# Patient Record
Sex: Male | Born: 1955 | Race: White | Hispanic: No | Marital: Single | Smoking: Never smoker
Health system: Southern US, Community
[De-identification: ages and names within clinical notes are randomized; demographics above are authoritative.]

## PROBLEM LIST (undated history)

## (undated) DIAGNOSIS — R011 Cardiac murmur, unspecified: Secondary | ICD-10-CM

## (undated) DIAGNOSIS — H269 Unspecified cataract: Secondary | ICD-10-CM

## (undated) DIAGNOSIS — H3320 Serous retinal detachment, unspecified eye: Secondary | ICD-10-CM

## (undated) DIAGNOSIS — E739 Lactose intolerance, unspecified: Secondary | ICD-10-CM

## (undated) DIAGNOSIS — F419 Anxiety disorder, unspecified: Secondary | ICD-10-CM

## (undated) DIAGNOSIS — I4891 Unspecified atrial fibrillation: Secondary | ICD-10-CM

## (undated) DIAGNOSIS — N529 Male erectile dysfunction, unspecified: Secondary | ICD-10-CM

## (undated) HISTORY — PX: EYE SURGERY: SHX253

## (undated) HISTORY — DX: Anxiety disorder, unspecified: F41.9

## (undated) HISTORY — PX: COLONOSCOPY: SHX174

## (undated) HISTORY — PX: APPENDECTOMY: SHX54

## (undated) HISTORY — DX: Lactose intolerance, unspecified: E73.9

## (undated) HISTORY — DX: Unspecified cataract: H26.9

## (undated) HISTORY — PX: UPPER GASTROINTESTINAL ENDOSCOPY: SHX188

## (undated) HISTORY — DX: Serous retinal detachment, unspecified eye: H33.20

## (undated) HISTORY — DX: Cardiac murmur, unspecified: R01.1

## (undated) HISTORY — DX: Male erectile dysfunction, unspecified: N52.9

---

## 2015-08-29 ENCOUNTER — Encounter (HOSPITAL_COMMUNITY): Admission: EM | Disposition: A | Discharge status: Home / Self Care | Payer: Self-pay | Source: Home / Self Care

## 2015-08-29 ENCOUNTER — Ambulatory Visit (INDEPENDENT_AMBULATORY_CARE_PROVIDER_SITE_OTHER): Payer: BLUE CROSS/BLUE SHIELD | Admitting: Family Medicine

## 2015-08-29 ENCOUNTER — Observation Stay (HOSPITAL_COMMUNITY): Payer: BLUE CROSS/BLUE SHIELD | Admitting: Anesthesiology

## 2015-08-29 ENCOUNTER — Emergency Department (HOSPITAL_COMMUNITY): Payer: BLUE CROSS/BLUE SHIELD

## 2015-08-29 ENCOUNTER — Ambulatory Visit: Admit: 2015-08-29 | Payer: Self-pay | Admitting: General Surgery

## 2015-08-29 ENCOUNTER — Encounter (HOSPITAL_COMMUNITY): Payer: Self-pay | Admitting: Emergency Medicine

## 2015-08-29 ENCOUNTER — Inpatient Hospital Stay (HOSPITAL_COMMUNITY)
Admission: EM | Admit: 2015-08-29 | Discharge: 2015-09-01 | DRG: 342 | Disposition: A | Discharge status: Home / Self Care | Payer: BLUE CROSS/BLUE SHIELD | Attending: Surgery | Admitting: Surgery

## 2015-08-29 VITALS — BP 104/56 | HR 63 | Temp 98.0°F | Resp 16 | Ht 79.5 in | Wt 194.0 lb

## 2015-08-29 DIAGNOSIS — K35891 Other acute appendicitis without perforation, with gangrene: Secondary | ICD-10-CM | POA: Diagnosis present

## 2015-08-29 DIAGNOSIS — Z7982 Long term (current) use of aspirin: Secondary | ICD-10-CM

## 2015-08-29 DIAGNOSIS — R100 Acute abdomen: Secondary | ICD-10-CM | POA: Diagnosis not present

## 2015-08-29 DIAGNOSIS — K3580 Unspecified acute appendicitis: Secondary | ICD-10-CM | POA: Diagnosis present

## 2015-08-29 DIAGNOSIS — Z800 Family history of malignant neoplasm of digestive organs: Secondary | ICD-10-CM | POA: Diagnosis not present

## 2015-08-29 DIAGNOSIS — K567 Ileus, unspecified: Secondary | ICD-10-CM | POA: Diagnosis not present

## 2015-08-29 DIAGNOSIS — F419 Anxiety disorder, unspecified: Secondary | ICD-10-CM | POA: Diagnosis present

## 2015-08-29 DIAGNOSIS — R109 Unspecified abdominal pain: Secondary | ICD-10-CM | POA: Diagnosis not present

## 2015-08-29 DIAGNOSIS — Z808 Family history of malignant neoplasm of other organs or systems: Secondary | ICD-10-CM | POA: Diagnosis not present

## 2015-08-29 DIAGNOSIS — K353 Acute appendicitis with localized peritonitis, without perforation or gangrene: Secondary | ICD-10-CM

## 2015-08-29 DIAGNOSIS — I482 Chronic atrial fibrillation: Secondary | ICD-10-CM | POA: Diagnosis present

## 2015-08-29 HISTORY — DX: Unspecified atrial fibrillation: I48.91

## 2015-08-29 HISTORY — PX: LAPAROSCOPIC APPENDECTOMY: SHX408

## 2015-08-29 LAB — COMPREHENSIVE METABOLIC PANEL
ALBUMIN: 3.9 g/dL (ref 3.5–5.0)
ALT: 23 U/L (ref 17–63)
ANION GAP: 9 (ref 5–15)
AST: 22 U/L (ref 15–41)
Alkaline Phosphatase: 73 U/L (ref 38–126)
BILIRUBIN TOTAL: 1.6 mg/dL — AB (ref 0.3–1.2)
BUN: 24 mg/dL — ABNORMAL HIGH (ref 6–20)
CO2: 24 mmol/L (ref 22–32)
Calcium: 8.8 mg/dL — ABNORMAL LOW (ref 8.9–10.3)
Chloride: 105 mmol/L (ref 101–111)
Creatinine, Ser: 1.06 mg/dL (ref 0.61–1.24)
GFR calc Af Amer: >60 mL/min (ref >60)
GFR calc non Af Amer: >60 mL/min (ref >60)
GLUCOSE: 118 mg/dL — AB (ref 65–99)
POTASSIUM: 4 mmol/L (ref 3.5–5.1)
SODIUM: 138 mmol/L (ref 135–145)
TOTAL PROTEIN: 7.1 g/dL (ref 6.5–8.1)

## 2015-08-29 LAB — CREATININE, SERUM
Creatinine, Ser: 0.86 mg/dL (ref 0.61–1.24)
GFR calc non Af Amer: >60 mL/min (ref >60)

## 2015-08-29 LAB — URINALYSIS, ROUTINE W REFLEX MICROSCOPIC
GLUCOSE, UA: NEGATIVE mg/dL
Hgb urine dipstick: NEGATIVE
KETONES UR: 40 mg/dL — AB
LEUKOCYTES UA: NEGATIVE
Nitrite: NEGATIVE
PROTEIN: 30 mg/dL — AB
Specific Gravity, Urine: 1.029 (ref 1.005–1.030)
pH: 5.5 (ref 5.0–8.0)

## 2015-08-29 LAB — CBC
HEMATOCRIT: 38.1 % — AB (ref 39.0–52.0)
Hemoglobin: 13 g/dL (ref 13.0–17.0)
MCH: 30.7 pg (ref 26.0–34.0)
MCHC: 34.1 g/dL (ref 30.0–36.0)
MCV: 90.1 fL (ref 78.0–100.0)
Platelets: 205 10*3/uL (ref 150–400)
RBC: 4.23 MIL/uL (ref 4.22–5.81)
RDW: 13.7 % (ref 11.5–15.5)
WBC: 11.1 10*3/uL — AB (ref 4.0–10.5)

## 2015-08-29 LAB — POC MICROSCOPIC URINALYSIS (UMFC)

## 2015-08-29 LAB — URINE MICROSCOPIC-ADD ON

## 2015-08-29 LAB — POCT CBC
Granulocyte percent: 85.6 %G — AB (ref 37–80)
HCT, POC: 41.5 % — AB (ref 43.5–53.7)
Hemoglobin: 14.8 g/dL (ref 14.1–18.1)
Lymph, poc: 1.3 (ref 0.6–3.4)
MCH, POC: 31.6 pg — AB (ref 27–31.2)
MCHC: 35.7 g/dL — AB (ref 31.8–35.4)
MCV: 88.5 fL (ref 80–97)
MID (cbc): 0.8 (ref 0–0.9)
MPV: 8.2 fL (ref 0–99.8)
POC Granulocyte: 12.6 — AB (ref 2–6.9)
POC LYMPH PERCENT: 9 %L — AB (ref 10–50)
POC MID %: 5.4 %M (ref 0–12)
Platelet Count, POC: 192 10*3/uL (ref 142–424)
RBC: 4.69 M/uL (ref 4.69–6.13)
RDW, POC: 13.7 %
WBC: 14.7 10*3/uL — AB (ref 4.6–10.2)

## 2015-08-29 LAB — POCT URINALYSIS DIP (MANUAL ENTRY)
Glucose, UA: NEGATIVE
Leukocytes, UA: NEGATIVE
Nitrite, UA: NEGATIVE
Protein Ur, POC: 100 — AB
Spec Grav, UA: 1.025
Urobilinogen, UA: 0.2
pH, UA: 5.5

## 2015-08-29 LAB — LIPASE, BLOOD: LIPASE: 26 U/L (ref 11–51)

## 2015-08-29 LAB — I-STAT CG4 LACTIC ACID, ED: Lactic Acid, Venous: 0.71 mmol/L (ref 0.5–2.0)

## 2015-08-29 LAB — IFOBT (OCCULT BLOOD): IFOBT: POSITIVE

## 2015-08-29 LAB — TYPE AND SCREEN
ABO/RH(D): A NEG
Antibody Screen: NEGATIVE

## 2015-08-29 LAB — ABO/RH: ABO/RH(D): A NEG

## 2015-08-29 SURGERY — APPENDECTOMY, LAPAROSCOPIC
Anesthesia: General

## 2015-08-29 MED ORDER — ESMOLOL HCL 100 MG/10ML IV SOLN
INTRAVENOUS | Status: DC | PRN
  Administered 2015-08-29: 30 ug via INTRAVENOUS
  Administered 2015-08-29: 15 ug via INTRAVENOUS

## 2015-08-29 MED ORDER — ONDANSETRON HCL 4 MG/2ML IJ SOLN: 4.0000 mg | Freq: Four times a day (QID) | INTRAMUSCULAR | Status: DC | PRN

## 2015-08-29 MED ORDER — HYDROMORPHONE HCL 1 MG/ML IJ SOLN
1.0000 mg | Freq: Once | INTRAMUSCULAR | Status: DC
  Filled 2015-08-29: qty 1

## 2015-08-29 MED ORDER — PIPERACILLIN-TAZOBACTAM 3.375 G IVPB 30 MIN
3.3750 g | Freq: Once | INTRAVENOUS | Status: AC
Start: 2015-08-29 — End: 2015-08-29
  Administered 2015-08-29: 3.375 g via INTRAVENOUS
  Filled 2015-08-29: qty 50

## 2015-08-29 MED ORDER — ENOXAPARIN SODIUM 40 MG/0.4ML ~~LOC~~ SOLN
40.0000 mg | SUBCUTANEOUS | Status: DC
  Filled 2015-08-29: qty 0.4

## 2015-08-29 MED ORDER — ONDANSETRON 4 MG PO TBDP
4.0000 mg | ORAL_TABLET | Freq: Four times a day (QID) | ORAL | Status: DC | PRN
  Filled 2015-08-29: qty 1

## 2015-08-29 MED ORDER — ROCURONIUM BROMIDE 100 MG/10ML IV SOLN
INTRAVENOUS | Status: DC | PRN
  Administered 2015-08-29: 40 mg via INTRAVENOUS

## 2015-08-29 MED ORDER — FENTANYL CITRATE (PF) 250 MCG/5ML IJ SOLN
INTRAMUSCULAR | Status: AC
  Filled 2015-08-29: qty 5

## 2015-08-29 MED ORDER — ACETAMINOPHEN 325 MG PO TABS: 650.0000 mg | ORAL_TABLET | Freq: Four times a day (QID) | ORAL | Status: DC | PRN

## 2015-08-29 MED ORDER — HYDROCODONE-ACETAMINOPHEN 5-325 MG PO TABS
1.0000 | ORAL_TABLET | ORAL | Status: DC | PRN
  Administered 2015-08-29 – 2015-08-30 (×2): 1 via ORAL
  Administered 2015-08-30: 2 via ORAL
  Administered 2015-08-31 – 2015-09-01 (×3): 1 via ORAL
  Filled 2015-08-29: qty 2
  Filled 2015-08-29 (×5): qty 1

## 2015-08-29 MED ORDER — SUGAMMADEX SODIUM 200 MG/2ML IV SOLN
INTRAVENOUS | Status: AC
Start: 2015-08-29 — End: 2015-08-29
  Filled 2015-08-29: qty 2

## 2015-08-29 MED ORDER — MIDAZOLAM HCL 5 MG/5ML IJ SOLN
INTRAMUSCULAR | Status: DC | PRN
  Administered 2015-08-29: 2 mg via INTRAVENOUS

## 2015-08-29 MED ORDER — ROCURONIUM BROMIDE 100 MG/10ML IV SOLN
INTRAVENOUS | Status: AC
  Filled 2015-08-29: qty 1

## 2015-08-29 MED ORDER — HEPARIN SODIUM (PORCINE) 5000 UNIT/ML IJ SOLN
5000.0000 [IU] | Freq: Three times a day (TID) | INTRAMUSCULAR | Status: DC
  Administered 2015-08-29 – 2015-09-01 (×7): 5000 [IU] via SUBCUTANEOUS
  Filled 2015-08-29 (×7): qty 1

## 2015-08-29 MED ORDER — ONDANSETRON HCL 4 MG/2ML IJ SOLN
INTRAMUSCULAR | Status: AC
  Filled 2015-08-29: qty 2

## 2015-08-29 MED ORDER — ASPIRIN 325 MG PO TABS: 325.0000 mg | ORAL_TABLET | Freq: Every day | ORAL | Status: DC

## 2015-08-29 MED ORDER — MEPERIDINE HCL 50 MG/ML IJ SOLN: 6.2500 mg | INTRAMUSCULAR | Status: DC | PRN

## 2015-08-29 MED ORDER — HYDROMORPHONE HCL 1 MG/ML IJ SOLN
0.2500 mg | INTRAMUSCULAR | Status: DC | PRN
  Administered 2015-08-29: 0.5 mg via INTRAVENOUS

## 2015-08-29 MED ORDER — LIDOCAINE HCL (CARDIAC) 20 MG/ML IV SOLN
INTRAVENOUS | Status: DC | PRN
  Administered 2015-08-29: 50 mg via INTRAVENOUS

## 2015-08-29 MED ORDER — MORPHINE SULFATE (PF) 2 MG/ML IV SOLN: 2.0000 mg | INTRAVENOUS | Status: DC | PRN

## 2015-08-29 MED ORDER — PIPERACILLIN-TAZOBACTAM 4.5 G IVPB: 4.5000 g | Freq: Once | INTRAVENOUS | Status: DC

## 2015-08-29 MED ORDER — DILTIAZEM HCL 100 MG IV SOLR
5.0000 mg/h | INTRAVENOUS | Status: DC
  Administered 2015-08-29: 5 mg/h via INTRAVENOUS
  Filled 2015-08-29: qty 100

## 2015-08-29 MED ORDER — PIPERACILLIN-TAZOBACTAM 3.375 G IVPB
3.3750 g | Freq: Three times a day (TID) | INTRAVENOUS | Status: DC
  Filled 2015-08-29: qty 50

## 2015-08-29 MED ORDER — PIPERACILLIN-TAZOBACTAM 3.375 G IVPB
INTRAVENOUS | Status: AC
  Filled 2015-08-29: qty 50

## 2015-08-29 MED ORDER — FENTANYL CITRATE (PF) 100 MCG/2ML IJ SOLN
50.0000 ug | INTRAMUSCULAR | Status: DC | PRN
  Filled 2015-08-29: qty 2

## 2015-08-29 MED ORDER — SUGAMMADEX SODIUM 200 MG/2ML IV SOLN
INTRAVENOUS | Status: DC | PRN
  Administered 2015-08-29: 175 mg via INTRAVENOUS

## 2015-08-29 MED ORDER — IOPAMIDOL (ISOVUE-370) INJECTION 76%
100.0000 mL | Freq: Once | INTRAVENOUS | Status: AC | PRN
  Administered 2015-08-29: 100 mL via INTRAVENOUS

## 2015-08-29 MED ORDER — DILTIAZEM LOAD VIA INFUSION
10.0000 mg | Freq: Once | INTRAVENOUS | Status: DC
  Filled 2015-08-29: qty 10

## 2015-08-29 MED ORDER — PROPOFOL 10 MG/ML IV BOLUS
INTRAVENOUS | Status: AC
  Filled 2015-08-29: qty 20

## 2015-08-29 MED ORDER — SUCCINYLCHOLINE CHLORIDE 20 MG/ML IJ SOLN
INTRAMUSCULAR | Status: DC | PRN
  Administered 2015-08-29: 100 mg via INTRAVENOUS

## 2015-08-29 MED ORDER — SODIUM CHLORIDE 0.9 % IV BOLUS (SEPSIS)
1000.0000 mL | Freq: Once | INTRAVENOUS | Status: AC
  Administered 2015-08-29: 1000 mL via INTRAVENOUS

## 2015-08-29 MED ORDER — ESMOLOL HCL 100 MG/10ML IV SOLN
INTRAVENOUS | Status: AC
  Filled 2015-08-29: qty 10

## 2015-08-29 MED ORDER — PIPERACILLIN-TAZOBACTAM 3.375 G IVPB
3.3750 g | Freq: Three times a day (TID) | INTRAVENOUS | Status: DC
  Administered 2015-08-29: 3.375 g via INTRAVENOUS
  Filled 2015-08-29 (×2): qty 50

## 2015-08-29 MED ORDER — PHENYLEPHRINE HCL 10 MG/ML IJ SOLN
INTRAMUSCULAR | Status: DC | PRN
  Administered 2015-08-29 (×3): 120 ug via INTRAVENOUS

## 2015-08-29 MED ORDER — BUPIVACAINE LIPOSOME 1.3 % IJ SUSP
20.0000 mL | Freq: Once | INTRAMUSCULAR | Status: AC
  Administered 2015-08-29: 20 mL
  Filled 2015-08-29: qty 20

## 2015-08-29 MED ORDER — MORPHINE SULFATE (PF) 2 MG/ML IV SOLN
1.0000 mg | INTRAVENOUS | Status: DC | PRN
  Administered 2015-08-30 (×2): 1 mg via INTRAVENOUS
  Filled 2015-08-29 (×2): qty 1

## 2015-08-29 MED ORDER — BUPROPION HCL ER (XL) 150 MG PO TB24: 150.0000 mg | ORAL_TABLET | Freq: Every day | ORAL | Status: DC

## 2015-08-29 MED ORDER — DILTIAZEM HCL 100 MG IV SOLR: 5.0000 mg/h | INTRAVENOUS | Status: DC

## 2015-08-29 MED ORDER — HYDROMORPHONE HCL 1 MG/ML IJ SOLN
INTRAMUSCULAR | Status: AC
  Administered 2015-08-29: 0.5 mg
  Filled 2015-08-29: qty 1

## 2015-08-29 MED ORDER — ACETAMINOPHEN 650 MG RE SUPP: 650.0000 mg | Freq: Four times a day (QID) | RECTAL | Status: DC | PRN

## 2015-08-29 MED ORDER — KCL IN DEXTROSE-NACL 20-5-0.45 MEQ/L-%-% IV SOLN
INTRAVENOUS | Status: DC
  Administered 2015-08-29 – 2015-08-31 (×5): via INTRAVENOUS
  Filled 2015-08-29 (×7): qty 1000

## 2015-08-29 MED ORDER — MIDAZOLAM HCL 2 MG/2ML IJ SOLN
INTRAMUSCULAR | Status: AC
  Filled 2015-08-29: qty 2

## 2015-08-29 MED ORDER — FENTANYL CITRATE (PF) 100 MCG/2ML IJ SOLN
INTRAMUSCULAR | Status: DC | PRN
  Administered 2015-08-29 (×3): 50 ug via INTRAVENOUS
  Administered 2015-08-29: 100 ug via INTRAVENOUS

## 2015-08-29 MED ORDER — LACTATED RINGERS IV SOLN
INTRAVENOUS | Status: DC | PRN
  Administered 2015-08-29: 18:00:00 via INTRAVENOUS

## 2015-08-29 MED ORDER — LIDOCAINE HCL (CARDIAC) 20 MG/ML IV SOLN
INTRAVENOUS | Status: AC
  Filled 2015-08-29: qty 5

## 2015-08-29 SURGICAL SUPPLY — 42 items
APPLIER CLIP 5 13 M/L LIGAMAX5 (MISCELLANEOUS)
APPLIER CLIP ROT 10 11.4 M/L (STAPLE)
BENZOIN TINCTURE PRP APPL 2/3 (GAUZE/BANDAGES/DRESSINGS) IMPLANT
CHLORAPREP W/TINT 26ML (MISCELLANEOUS) IMPLANT
CLIP APPLIE 5 13 M/L LIGAMAX5 (MISCELLANEOUS) IMPLANT
CLIP APPLIE ROT 10 11.4 M/L (STAPLE) IMPLANT
COVER SURGICAL LIGHT HANDLE (MISCELLANEOUS) IMPLANT
CUTTER FLEX LINEAR 45M (STAPLE) IMPLANT
DECANTER SPIKE VIAL GLASS SM (MISCELLANEOUS) IMPLANT
DRAIN CHANNEL 19F RND (DRAIN) IMPLANT
DRAPE LAPAROSCOPIC ABDOMINAL (DRAPES) ×2 IMPLANT
DRSG TEGADERM 2-3/8X2-3/4 SM (GAUZE/BANDAGES/DRESSINGS) ×4 IMPLANT
ELECT REM PT RETURN 9FT ADLT (ELECTROSURGICAL) ×2
ELECTRODE REM PT RTRN 9FT ADLT (ELECTROSURGICAL) ×1 IMPLANT
ENDOLOOP SUT PDS II  0 18 (SUTURE)
ENDOLOOP SUT PDS II 0 18 (SUTURE) IMPLANT
EVACUATOR SILICONE 100CC (DRAIN) IMPLANT
GAUZE SPONGE 2X2 8PLY STRL LF (GAUZE/BANDAGES/DRESSINGS) ×1 IMPLANT
GLOVE ECLIPSE 8.0 STRL XLNG CF (GLOVE) ×12 IMPLANT
GLOVE INDICATOR 8.0 STRL GRN (GLOVE) ×2 IMPLANT
GOWN STRL REUS W/TWL XL LVL3 (GOWN DISPOSABLE) ×6 IMPLANT
KIT BASIN OR (CUSTOM PROCEDURE TRAY) ×2 IMPLANT
POUCH SPECIMEN RETRIEVAL 10MM (ENDOMECHANICALS) ×2 IMPLANT
RELOAD 45 VASCULAR/THIN (ENDOMECHANICALS) ×2 IMPLANT
RELOAD STAPLE TA45 3.5 REG BLU (ENDOMECHANICALS) IMPLANT
SCISSORS LAP 5X35 DISP (ENDOMECHANICALS) IMPLANT
SET IRRIG TUBING LAPAROSCOPIC (IRRIGATION / IRRIGATOR) ×2 IMPLANT
SHEARS HARMONIC ACE PLUS 36CM (ENDOMECHANICALS) ×2 IMPLANT
SLEEVE XCEL OPT CAN 5 100 (ENDOMECHANICALS) ×2 IMPLANT
SOLUTION ANTI FOG 6CC (MISCELLANEOUS) ×2 IMPLANT
SPONGE GAUZE 2X2 STER 10/PKG (GAUZE/BANDAGES/DRESSINGS) ×1
STRIP CLOSURE SKIN 1/2X4 (GAUZE/BANDAGES/DRESSINGS) ×2 IMPLANT
SUT ETHILON 3 0 PS 1 (SUTURE) IMPLANT
SUT MNCRL AB 4-0 PS2 18 (SUTURE) ×2 IMPLANT
TOWEL OR 17X26 10 PK STRL BLUE (TOWEL DISPOSABLE) ×2 IMPLANT
TOWEL OR NON WOVEN STRL DISP B (DISPOSABLE) ×2 IMPLANT
TRAY FOLEY W/METER SILVER 14FR (SET/KITS/TRAYS/PACK) IMPLANT
TRAY FOLEY W/METER SILVER 16FR (SET/KITS/TRAYS/PACK) ×2 IMPLANT
TRAY LAPAROSCOPIC (CUSTOM PROCEDURE TRAY) ×2 IMPLANT
TROCAR BLADELESS OPT 5 100 (ENDOMECHANICALS) ×2 IMPLANT
TROCAR XCEL BLUNT TIP 100MML (ENDOMECHANICALS) ×2 IMPLANT
TUBING INSUF HEATED (TUBING) ×2 IMPLANT

## 2015-08-29 NOTE — Transfer of Care (Signed)
Immediate Anesthesia Transfer of Care Note  Patient: Jose Holden  Procedure(s) Performed: Procedure(s): APPENDECTOMY LAPAROSCOPIC (N/A)  Patient Location: PACU  Anesthesia Type:General  Level of Consciousness:  sedated, patient cooperative and responds to stimulation  Airway & Oxygen Therapy:Patient Spontanous Breathing and Patient connected to face mask oxgen  Post-op Assessment:  Report given to PACU RN and Post -op Vital signs reviewed and stable  Post vital signs:  Reviewed and stable  Last Vitals:  Filed Vitals:   08/29/15 1615 08/29/15 2018  BP: 128/53 133/87  Pulse: 67 52  Temp: 37.5 C   Resp: 16     Complications: No apparent anesthesia complications

## 2015-08-29 NOTE — ED Provider Notes (Signed)
Medical screening examination/treatment/procedure(s) were conducted as a shared visit with non-physician practitioner(s) and myself.  I personally evaluated the patient during the encounter.   EKG Interpretation None     60 y.o. male presents with right lower and suprapubic abdominal pain over the last 3 days. Appears to be worsening. Chronic a fib on daily aspirin. Guarding and rebound on exam, leukocytosis noted at urgent care. CTA to eval for appy vs mesenteric ischemia vs perforated viscous or other surgical cause.    CT c/w appendicitis. Surgery consulted and will admit the patient for further management.   Results for orders placed or performed during the hospital encounter of 08/29/15  Comprehensive metabolic panel  Result Value Ref Range   Sodium 138 135 - 145 mmol/L   Potassium 4.0 3.5 - 5.1 mmol/L   Chloride 105 101 - 111 mmol/L   CO2 24 22 - 32 mmol/L   Glucose, Bld 118 (H) 65 - 99 mg/dL   BUN 24 (H) 6 - 20 mg/dL   Creatinine, Ser 1.06 0.61 - 1.24 mg/dL   Calcium 8.8 (L) 8.9 - 10.3 mg/dL   Total Protein 7.1 6.5 - 8.1 g/dL   Albumin 3.9 3.5 - 5.0 g/dL   AST 22 15 - 41 U/L   ALT 23 17 - 63 U/L   Alkaline Phosphatase 73 38 - 126 U/L   Total Bilirubin 1.6 (H) 0.3 - 1.2 mg/dL   GFR calc non Af Amer >60 >60 mL/min   GFR calc Af Amer >60 >60 mL/min   Anion gap 9 5 - 15  Lipase, blood  Result Value Ref Range   Lipase 26 11 - 51 U/L  Urinalysis, Routine w reflex microscopic  Result Value Ref Range   Color, Urine AMBER (A) YELLOW   APPearance CLEAR CLEAR   Specific Gravity, Urine 1.029 1.005 - 1.030   pH 5.5 5.0 - 8.0   Glucose, UA NEGATIVE NEGATIVE mg/dL   Hgb urine dipstick NEGATIVE NEGATIVE   Bilirubin Urine SMALL (A) NEGATIVE   Ketones, ur 40 (A) NEGATIVE mg/dL   Protein, ur 30 (A) NEGATIVE mg/dL   Nitrite NEGATIVE NEGATIVE   Leukocytes, UA NEGATIVE NEGATIVE  Urine microscopic-add on  Result Value Ref Range   Squamous Epithelial / LPF 0-5 (A) NONE SEEN   WBC, UA  0-5 0 - 5 WBC/hpf   RBC / HPF 0-5 0 - 5 RBC/hpf   Bacteria, UA RARE (A) NONE SEEN   Urine-Other MUCOUS PRESENT   I-Stat CG4 Lactic Acid, ED  Result Value Ref Range   Lactic Acid, Venous 0.71 0.5 - 2.0 mmol/L  Type and screen [facility]  Result Value Ref Range   ABO/RH(D) A NEG    Antibody Screen NEG    Sample Expiration 09/01/2015   ABO/Rh  Result Value Ref Range   ABO/RH(D) A NEG    Ct Cta Abd/pel W/cm &/or W/o Cm  08/29/2015  CLINICAL DATA:  Evaluate for mesenteric ischemia. Fever on Friday. Lower abdominal and pelvic pain. EXAM: CTA ABDOMEN AND PELVIS wITHOUT AND WITH CONTRAST TECHNIQUE: Multidetector CT imaging of the abdomen and pelvis was performed using the standard protocol during bolus administration of intravenous contrast. Multiplanar reconstructed images and MIPs were obtained and reviewed to evaluate the vascular anatomy. CONTRAST:  100 cc Isovue 370 IV COMPARISON:  None. FINDINGS: Lower chest: 11 mm nodule at the left lung base posteriorly. Right lung bases clear. No effusions. Heart is mildly enlarged. Hepatobiliary: No focal hepatic abnormality. Gallbladder  unremarkable. Pancreas: No focal abnormality Spleen: No focal abnormality Adrenals/Urinary Tract: No adrenal abnormality. No focal renal abnormality. No stones or hydronephrosis. Urinary bladder is unremarkable. Stomach/Bowel: The appendix is dilated containing several high-density areas, likely appendicolith. Appendix dilated up to 17 mm. Surrounding inflammatory change in the pelvis. The appendix extends into the cul-de-sac with is inflammatory change. Adjacent small bowel loops also demonstrate wall thickening, likely secondarily infected as is the adjacent sigmoid colon. Stomach, large and small bowel otherwise grossly unremarkable. Vascular/Lymphatic: Scattered aortic calcifications. No aneurysm. Mild narrowing at the origin of the celiac artery. Superior mesenteric artery and inferior mesenteric artery are  widely patent. Main renal arteries are widely patent. There is a very small accessory left renal artery arising from the distal aorta. Reproductive: Grossly unremarkable Other: Trace free fluid in the pelvis.  No free air. Musculoskeletal: No acute bony abnormality or focal bone lesion. Review of the MIP images confirms the above findings. IMPRESSION: Inflammatory process in the pelvis which appears to be centered around the dilated appendix with several high density areas, likely appendicolith. Adjacent small bowel loops and sigmoid colon are also inflamed with wall thickening, likely secondarily. Findings most likely reflect acute appendicitis. Trace free fluid in the pelvis. Mild narrowing of the celiac origin. Remainder of the mesenteric vessels and renal arteries are widely patent. 11 mm pleural-based nodule at the left lung base posteriorly. This could be followed with chest CT in 3-6 months. Electronically Signed   By: Rolm Baptise M.D.   On: 08/29/2015 13:55      See related encounter note   Leo Grosser, MD 08/29/15 [phone]

## 2015-08-29 NOTE — ED Notes (Signed)
Pt refusing pain medication at this time

## 2015-08-29 NOTE — ED Provider Notes (Signed)
CSN: [id number]     Arrival date & time 08/29/15  1034 History   First MD Initiated Contact with Patient 08/29/15 1120     Chief Complaint  Patient presents with  . Abdominal Pain  . Melena    HPI Comments: 60 year old male presents with abdominal pain for the past 3 days. He states he was out of town Friday night, had a beer with a friend, and started having an uncomfortable feeling in his stomach. He ate a meal that night and when he went to lie down to sleep started to have abdominal pain. He reports subjective fever and chills on Saturday but took an ASA and felt like he has not had a fever since them. He has been taking Pepto-Bismol with minimal relief. Last dose was Sunday morning. Today he reports associated diarrhea and black stools. He went to UC where they did a CBC, UA, and FOBT which showed elevated WBC with positive blood in stool. Provider was concerned for possible perforated ulcer. Pt denies chest pain, SOB, nausea, or vomiting. He is currently taking 325mg  ASA for A.fib.   Patient is a 60 y.o. male presenting with abdominal pain.  Abdominal Pain Associated symptoms: chills, diarrhea and fever   Associated symptoms: no chest pain, no constipation, no dysuria, no nausea, no shortness of breath and no vomiting     Past Medical History  Diagnosis Date  . Anxiety   . Cataract   . Heart murmur    Past Surgical History  Procedure Laterality Date  . Eye surgery     Family History  Problem Relation Age of Onset  . Cancer Mother   . Cancer Father    Social History  Substance Use Topics  . Smoking status: Never Smoker   . Smokeless tobacco: None  . Alcohol Use: None    Review of Systems  Constitutional: Positive for fever and chills.  Respiratory: Negative for shortness of breath.   Cardiovascular: Negative for chest pain.  Gastrointestinal: Positive for abdominal pain, diarrhea and blood in stool. Negative for nausea, vomiting, constipation and abdominal distention.   Genitourinary: Negative for dysuria.  All other systems reviewed and are negative.   Allergies  Review of patient's allergies indicates no known allergies.  Home Medications   Prior to Admission medications   Medication Sig Start Date End Date Taking? Authorizing Provider  aspirin 325 MG tablet Take 325 mg by mouth daily.    Historical Provider, MD  buPROPion (WELLBUTRIN XL) 150 MG 24 hr tablet Take 150 mg by mouth daily.    Historical Provider, MD   BP 123/71 mmHg  Pulse 63  Temp(Src) 98.6 F (37 C) (Oral)  Resp 18  SpO2 99%   Physical Exam  Constitutional: He is oriented to person, place, and time. He appears well-developed and well-nourished. No distress.  HENT:  Head: Normocephalic and atraumatic.  Eyes: Conjunctivae are normal. Pupils are equal, round, and reactive to light. Right eye exhibits no discharge. Left eye exhibits no discharge. No scleral icterus.  Neck: Normal range of motion.  Cardiovascular: Normal rate.  An irregular rhythm present. Exam reveals no gallop and no friction rub.   No murmur heard. Pulmonary/Chest: Effort normal. No respiratory distress. He has no wheezes. He has no rales. He exhibits no tenderness.  Abdominal: Soft. He exhibits no distension and no mass. There is tenderness. There is rebound and guarding.  Significant tenderness over RLQ and suprapubic area. +Rebound tenderness.   Neurological: He is alert  and oriented to person, place, and time.  Skin: Skin is warm and dry.  Psychiatric: He has a normal mood and affect.    ED Course  Procedures (including critical care time) Labs Review Labs Reviewed  COMPREHENSIVE METABOLIC PANEL - Abnormal; Notable for the following:    Glucose, Bld 118 (*)    BUN 24 (*)    Calcium 8.8 (*)    Total Bilirubin 1.6 (*)    All other components within normal limits  URINALYSIS, ROUTINE W REFLEX MICROSCOPIC (NOT AT [facility]) - Abnormal; Notable for the following:    Color, Urine AMBER (*)    Bilirubin  Urine SMALL (*)    Ketones, ur 40 (*)    Protein, ur 30 (*)    All other components within normal limits  URINE MICROSCOPIC-ADD ON - Abnormal; Notable for the following:    Squamous Epithelial / LPF 0-5 (*)    Bacteria, UA RARE (*)    All other components within normal limits  LIPASE, BLOOD  I-STAT CG4 LACTIC ACID, ED  TYPE AND SCREEN  ABO/RH    Imaging Review Ct Cta Abd/pel W/cm &/or W/o Cm  08/29/2015  CLINICAL DATA:  Evaluate for mesenteric ischemia. Fever on Friday. Lower abdominal and pelvic pain. EXAM: CTA ABDOMEN AND PELVIS wITHOUT AND WITH CONTRAST TECHNIQUE: Multidetector CT imaging of the abdomen and pelvis was performed using the standard protocol during bolus administration of intravenous contrast. Multiplanar reconstructed images and MIPs were obtained and reviewed to evaluate the vascular anatomy. CONTRAST:  100 cc Isovue 370 IV COMPARISON:  None. FINDINGS: Lower chest: 11 mm nodule at the left lung base posteriorly. Right lung bases clear. No effusions. Heart is mildly enlarged. Hepatobiliary: No focal hepatic abnormality. Gallbladder unremarkable. Pancreas: No focal abnormality Spleen: No focal abnormality Adrenals/Urinary Tract: No adrenal abnormality. No focal renal abnormality. No stones or hydronephrosis. Urinary bladder is unremarkable. Stomach/Bowel: The appendix is dilated containing several high-density areas, likely appendicolith. Appendix dilated up to 17 mm. Surrounding inflammatory change in the pelvis. The appendix extends into the cul-de-sac with is inflammatory change. Adjacent small bowel loops also demonstrate wall thickening, likely secondarily infected as is the adjacent sigmoid colon. Stomach, large and small bowel otherwise grossly unremarkable. Vascular/Lymphatic: Scattered aortic calcifications. No aneurysm. Mild narrowing at the origin of the celiac artery. Superior mesenteric artery and inferior mesenteric artery are widely patent. Main renal arteries are  widely patent. There is a very small accessory left renal artery arising from the distal aorta. Reproductive: Grossly unremarkable Other: Trace free fluid in the pelvis.  No free air. Musculoskeletal: No acute bony abnormality or focal bone lesion. Review of the MIP images confirms the above findings. IMPRESSION: Inflammatory process in the pelvis which appears to be centered around the dilated appendix with several high density areas, likely appendicolith. Adjacent small bowel loops and sigmoid colon are also inflamed with wall thickening, likely secondarily. Findings most likely reflect acute appendicitis. Trace free fluid in the pelvis. Mild narrowing of the celiac origin. Remainder of the mesenteric vessels and renal arteries are widely patent. 11 mm pleural-based nodule at the left lung base posteriorly. This could be followed with chest CT in 3-6 months. Electronically Signed   By: Rolm Baptise M.D.   On: 08/29/2015 13:55   I have personally reviewed and evaluated these images and lab results as part of my medical decision-making.   EKG Interpretation None      MDM   Final diagnoses:  Acute appendicitis with localized peritonitis  60 year old male presents from UC with some concerning findings. PE was suspicious from abdominal pathology so CT was obtained. IVF started. Pain medication offered - pt refused per nursing.  CT of abdomen shows possible appendicitis. Zosyn, IVF stated. Pt made NPO. Surgery consulted. Shared visit with Dr. Ivar Bury.  Incidental finding of 1.1cm nodule in left lung. Pt informed of this finding and advised to follow up. Pt denies being a smoker.    Recardo Evangelist, PA-C 08/29/15 [address], PA-C 08/29/15 1527  Leo Grosser, MD 08/29/15 [phone]

## 2015-08-29 NOTE — Patient Instructions (Addendum)
,   Directly to the emergency department at [place]

## 2015-08-29 NOTE — Anesthesia Preprocedure Evaluation (Addendum)
Anesthesia Evaluation  Patient identified by MRN, date of birth, ID band Patient awake    Reviewed: Allergy & Precautions, NPO status , Patient's Chart, lab work & pertinent test results  Airway Mallampati: II  TM Distance: >3 FB Neck ROM: Full    Dental no notable dental hx.    Pulmonary neg pulmonary ROS,    Pulmonary exam normal breath sounds clear to auscultation       Cardiovascular Normal cardiovascular exam+ dysrhythmias Atrial Fibrillation  Rhythm:Irregular Rate:Normal  ASA only   Neuro/Psych PSYCHIATRIC DISORDERS Anxiety negative neurological ROS     GI/Hepatic negative GI ROS, Neg liver ROS,   Endo/Other  negative endocrine ROS  Renal/GU negative Renal ROS     Musculoskeletal negative musculoskeletal ROS (+)   Abdominal   Peds  Hematology negative hematology ROS (+)   Anesthesia Other Findings   Reproductive/Obstetrics negative OB ROS                           Anesthesia Physical Anesthesia Plan  ASA: II and emergent  Anesthesia Plan: General   Post-op Pain Management:    Induction: Intravenous, Rapid sequence and Cricoid pressure planned  Airway Management Planned: Oral ETT  Additional Equipment:   Intra-op Plan:   Post-operative Plan: Extubation in OR  Informed Consent: I have reviewed the patients History and Physical, chart, labs and discussed the procedure including the risks, benefits and alternatives for the proposed anesthesia with the patient or authorized representative who has indicated his/her understanding and acceptance.   Dental advisory given  Plan Discussed with: CRNA  Anesthesia Plan Comments:         Anesthesia Quick Evaluation

## 2015-08-29 NOTE — Progress Notes (Signed)
[facility] Surgery Admission Note  Jose Holden [facility] 1955/08/29  [id number].    Requesting MD: Dr. Laneta Holden Chief Complaint/Reason for Consult: Acute appendicitis  HPI:  60 y/o white male with AFIB, retinal detachment, and anxiety presents to [facility] with abdominal pain since Saturday night.  Central abdominal pain which is a constant ache in nature.  Radiates to the entire abdomen.  No N/V.  Subjective fevers/chills, fatigue.  Dark - black BM's.  Heme-occult positive at [facility].  No SOB/CP, dysuria.  No precipitating/alleviating factors.  He tried pepto bismol, Gatorade, and bland diet, but appetite has been poor.  Last meal was last night.  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  The [facility] told him to go to the ER today because they thought he had a perforated ulcer.  He has a h/o Detached retina  Surgery twice.  Takes Asprin for AFIB, Bupropion for anxiety, propanolol for performance anxiety.  His PCP is Dr. Burt Holden in [facility]  No past surgical history.  Past family history - mother died of pancreatic cancer, dad died of brain cancer.    ROS: All systems reviewed and otherwise negative except for as above  Family History  Problem Relation Age of Onset  . Cancer Mother   . Cancer Father     Past Medical History  Diagnosis Date  . Anxiety   . Cataract   . Heart murmur     Past Surgical History  Procedure Laterality Date  . Eye surgery      Social History:  reports that he has never smoked. He does not have any smokeless tobacco history on file. His alcohol and drug histories are not on file.  Allergies: No Known Allergies   (Not in a hospital admission)  Blood pressure 130/79, pulse 66, temperature 98.6 F (37 C), temperature source Oral, resp. rate 16, SpO2 96 %. Physical Exam: General: pleasant, tall and thin, WD/WN white male who is laying in bed in NAD HEENT: head is normocephalic, atraumatic.  Sclera are noninjected.   PERRL.  Ears and nose without any masses or lesions.  Mouth is pink and moist Heart: regularly irregular.  No obvious murmurs, gallops, or rubs noted.  Palpable pedal pulses bilaterally. Lungs: CTAB, no wheezes, rhonchi, or rales noted.  Respiratory effort nonlabored Abd: soft, mild distension, diffusely tender especially centrally, +BS, no masses, hernias, or organomegaly MS: all 4 extremities are symmetrical with no cyanosis, clubbing, or edema. Skin: warm and dry with no masses, lesions, or rashes Psych: A&Ox3 with an appropriate affect.   Results for orders placed or performed during the hospital encounter of 08/29/15 (from the past 48 hour(s))  Comprehensive metabolic panel     Status: Abnormal   Collection Time: 08/29/15 11:13 AM  Result Value Ref Range   Sodium 138 135 - 145 mmol/L   Potassium 4.0 3.5 - 5.1 mmol/L   Chloride 105 101 - 111 mmol/L   CO2 24 22 - 32 mmol/L   Glucose, Bld 118 (H) 65 - 99 mg/dL   BUN 24 (H) 6 - 20 mg/dL   Creatinine, Ser 1.06 0.61 - 1.24 mg/dL   Calcium 8.8 (L) 8.9 - 10.3 mg/dL   Total Protein 7.1 6.5 - 8.1 g/dL   Albumin 3.9 3.5 - 5.0 g/dL   AST 22 15 - 41 U/L   ALT 23 17 - 63 U/L   Alkaline Phosphatase 73 38 - 126 U/L   Total Bilirubin 1.6 (H) 0.3 -  1.2 mg/dL   GFR calc non Af Amer >60 >60 mL/min   GFR calc Af Amer >60 >60 mL/min    Comment: (NOTE) The eGFR has been calculated using the CKD EPI equation. This calculation has not been validated in all clinical situations. eGFR's persistently <60 mL/min signify possible Chronic Kidney Disease.    Anion gap 9 5 - 15  Type and screen Jose Holden     Status: None   Collection Time: 08/29/15 11:13 AM  Result Value Ref Range   ABO/RH(D) A NEG    Antibody Screen NEG    Sample Expiration 09/01/2015   Lipase, blood     Status: None   Collection Time: 08/29/15 11:13 AM  Result Value Ref Range   Lipase 26 11 - 51 U/L  ABO/Rh     Status: None   Collection Time: 08/29/15 11:13  AM  Result Value Ref Range   ABO/RH(D) A NEG   Urinalysis, Routine w reflex microscopic     Status: Abnormal   Collection Time: 08/29/15  1:05 PM  Result Value Ref Range   Color, Urine AMBER (A) YELLOW    Comment: BIOCHEMICALS MAY BE AFFECTED BY COLOR   APPearance CLEAR CLEAR   Specific Gravity, Urine 1.029 1.005 - 1.030   pH 5.5 5.0 - 8.0   Glucose, UA NEGATIVE NEGATIVE mg/dL   Hgb urine dipstick NEGATIVE NEGATIVE   Bilirubin Urine SMALL (A) NEGATIVE   Ketones, ur 40 (A) NEGATIVE mg/dL   Protein, ur 30 (A) NEGATIVE mg/dL   Nitrite NEGATIVE NEGATIVE   Leukocytes, UA NEGATIVE NEGATIVE  Urine microscopic-add on     Status: Abnormal   Collection Time: 08/29/15  1:05 PM  Result Value Ref Range   Squamous Epithelial / LPF 0-5 (A) NONE SEEN   WBC, UA 0-5 0 - 5 WBC/hpf   RBC / HPF 0-5 0 - 5 RBC/hpf   Bacteria, UA RARE (A) NONE SEEN   Urine-Other MUCOUS PRESENT   I-Stat CG4 Lactic Acid, ED     Status: None   Collection Time: 08/29/15  2:38 PM  Result Value Ref Range   Lactic Acid, Venous 0.71 0.5 - 2.0 mmol/L   Ct Cta Abd/pel W/cm &/or W/o Cm  08/29/2015  CLINICAL DATA:  Evaluate for mesenteric ischemia. Fever on Friday. Lower abdominal and pelvic pain. EXAM: CTA ABDOMEN AND PELVIS wITHOUT AND WITH CONTRAST TECHNIQUE: Multidetector CT imaging of the abdomen and pelvis was performed using the standard protocol during bolus administration of intravenous contrast. Multiplanar reconstructed images and MIPs were obtained and reviewed to evaluate the vascular anatomy. CONTRAST:  100 cc Isovue 370 IV COMPARISON:  None. FINDINGS: Lower chest: 11 mm nodule at the left lung base posteriorly. Right lung bases clear. No effusions. Heart is mildly enlarged. Hepatobiliary: No focal hepatic abnormality. Gallbladder unremarkable. Pancreas: No focal abnormality Spleen: No focal abnormality Adrenals/Urinary Tract: No adrenal abnormality. No focal renal abnormality. No stones or hydronephrosis. Urinary bladder  is unremarkable. Stomach/Bowel: The appendix is dilated containing several high-density areas, likely appendicolith. Appendix dilated up to 17 mm. Surrounding inflammatory change in the pelvis. The appendix extends into the cul-de-sac with is inflammatory change. Adjacent small bowel loops also demonstrate wall thickening, likely secondarily infected as is the adjacent sigmoid colon. Stomach, large and small bowel otherwise grossly unremarkable. Vascular/Lymphatic: Scattered aortic calcifications. No aneurysm. Mild narrowing at the origin of the celiac artery. Superior mesenteric artery and inferior mesenteric artery are widely patent. Main renal arteries are widely  patent. There is a very small accessory left renal artery arising from the distal aorta. Reproductive: Grossly unremarkable Other: Trace free fluid in the pelvis.  No free air. Musculoskeletal: No acute bony abnormality or focal bone lesion. Review of the MIP images confirms the above findings. IMPRESSION: Inflammatory process in the pelvis which appears to be centered around the dilated appendix with several high density areas, likely appendicolith. Adjacent small bowel loops and sigmoid colon are also inflamed with wall thickening, likely secondarily. Findings most likely reflect acute appendicitis. Trace free fluid in the pelvis. Mild narrowing of the celiac origin. Remainder of the mesenteric vessels and renal arteries are widely patent. 11 mm pleural-based nodule at the left lung base posteriorly. This could be followed with chest CT in 3-6 months. Electronically Signed   By: Rolm Baptise M.D.   On: 08/29/2015 13:55      Assessment/Plan Acute appendicitis Leukocytosis - 14.7 AFIB on aspirin only Anxiety/performance anxiety  Plan: 1.  Admit to [facility], urgent lap appy 2.  NPO, bowel rest, IVF, pain control, antiemetics, antibiotics (Zosyn already given 1 dose) 3.  Discussed risks/benefits of procedure, post-operative care and requirements  for discharge home tomorrow if all goes well.   4.  All questions were sought and answered.  He would like to proceed with surgery.     Nat Christen, [facility] 08/29/2015, 3:06 PM Pager: [phone] (7am - 4:30pm M-F; 7am - 11:30am Sa/Su)

## 2015-08-29 NOTE — Anesthesia Postprocedure Evaluation (Signed)
Anesthesia Post Note  Patient: Jose Holden  Procedure(s) Performed: Procedure(s) (LRB): APPENDECTOMY LAPAROSCOPIC (N/A)  Patient location during evaluation: PACU Anesthesia Type: General Level of consciousness: sedated and patient cooperative Pain management: pain level controlled Vital Signs Assessment: post-procedure vital signs reviewed and stable Respiratory status: spontaneous breathing Cardiovascular status: stable Anesthetic complications: no    Last Vitals:  Filed Vitals:   08/29/15 2030 08/29/15 2045  BP: 132/82 131/76  Pulse: 97 121  Temp:    Resp: 15 23    Last Pain:  Filed Vitals:   08/29/15 2051  PainSc: [name]

## 2015-08-29 NOTE — ED Notes (Addendum)
Pt being sent by Lauenstein MD(PCP) d/t "acute abdomen."  C/o abdominal pain and dark diarrhea x 3 days.  WBC 14.7.

## 2015-08-29 NOTE — Progress Notes (Addendum)
Pt admitted to pacu with cardizem gtt infusing at 10 mg/hr

## 2015-08-29 NOTE — ED Notes (Signed)
Suprapubic pain since Friday with black stools since saturday. Went to PCP this morning and had lab work done and was sent here. Denies N/V, states he took aspirin for a fever Friday and it hasn't come back since.

## 2015-08-29 NOTE — Progress Notes (Signed)
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  He had a possible bout of hematuria one month ago  His mother died of breast cancer  Past Medical History  Diagnosis Date  . Anxiety   . Cataract   . Heart murmur    H/o detached retina OS  Objective:  Moderate discomfort at  Rest, pale BP 104/56 mmHg  Pulse 63  Temp(Src) 98 F (36.7 C) (Oral)  Resp 16  Ht 6' 7.5" (2.019 m)  Wt 194 lb (87.998 kg)  BMI 21.59 kg/m2  SpO2 97% Abdomen:  Scaphoid, guarding with rebound and diffuse tenderness and hyperactive bowel sounds Rectal:  Normal  Results for orders placed or performed in visit on 08/29/15  POCT CBC  Result Value Ref Range   WBC 14.7 (A) 4.6 - 10.2 K/uL   Lymph, poc 1.3 0.6 - 3.4   POC LYMPH PERCENT 9.0 (A) 10 - 50 %L   MID (cbc) 0.8 0 - 0.9   POC MID % 5.4 0 - 12 %M   POC Granulocyte 12.6 (A) 2 - 6.9   Granulocyte percent 85.6 (A) 37 - 80 %G   RBC 4.69 4.69 - 6.13 M/uL   Hemoglobin 14.8 14.1 - 18.1 g/dL   HCT, POC 41.5 (A) 43.5 - 53.7 %   MCV 88.5 80 - 97 fL   MCH, POC 31.6 (A) 27 - 31.2 pg   MCHC 35.7 (A) 31.8 - 35.4 g/dL   RDW, POC 13.7 %   Platelet Count, POC 192 142 - 424 K/uL   MPV 8.2 0 - 99.8 fL  IFOBT POC (occult bld, rslt in office)  Result Value Ref Range   IFOBT Positive   POCT urinalysis dipstick  Result Value Ref Range   Color, UA brown (A) yellow   Clarity, UA hazy (A) clear   Glucose, UA negative negative   Bilirubin, UA small (A) negative   Ketones, POC UA small (15) (A) negative   Spec Grav, UA 1.025    Blood, UA trace-lysed (A) negative   pH, UA 5.5    Protein Ur, POC =100 (A) negative   Urobilinogen, UA 0.2    Nitrite, UA Negative Negative   Leukocytes, UA Negative Negative  POCT Microscopic Urinalysis ([facility])  Result Value Ref Range   WBC,UR,HPF,POC Few (A) None WBC/hpf   RBC,UR,HPF,POC Few (A) None RBC/hpf   Bacteria Few (A) None, Too  numerous to count   Mucus Present (A) Absent   Epithelial Cells, UR Per Microscopy Few (A) None, Too numerous to count cells/hpf   Assessment: 60 year old gentleman with acute abdomen suggestive for perforated ulcer. Given his appearance and positive blood in stool, I'm recommending these were directly to the emergency room.  Plan: Emergency evaluation  Signed, Carola Frost.D.

## 2015-08-29 NOTE — Op Note (Signed)
Surgeon: Kaylyn Lim, MD, FACS  Asst:  none  Anes:  general  Procedure: Laparoscopic appendectomy  Diagnosis: Gangrenous appendicitis (see photo in media)  Complications: none  EBL:   minimal cc  Drains: none  Description of Procedure:  The patient was taken to OR 1 at [facility].  After anesthesia was administered and the patient was prepped a timeout was performed.  Hasson technique through umbilicus.  5 mm in RUQ and LLQ.  A black gangrenous appendix was dissected out of the pelvic side wall.  It was mobilized in toto and the mesentery transected with the Harmonic scalpel.  The base of the appendix was isolated and stapled with a vascular load.  The appendix was removed in a bag.  The umbilicus was repaired with a fig of 8 of 0 vicryl.  The incisions were injected with Exparel and closed with 4-0 vicryl and liquiban.    The patient tolerated the procedure well and was taken to the PACU in stable condition.     Matt B. Hassell Done, [place], [facility], [place]

## 2015-08-29 NOTE — Anesthesia Procedure Notes (Signed)
Procedure Name: Intubation Date/Time: 08/29/2015 7:08 PM Performed by: Anne Fu Pre-anesthesia Checklist: Patient identified, Emergency Drugs available, Suction available, Patient being monitored and Timeout performed Patient Re-evaluated:Patient Re-evaluated prior to inductionOxygen Delivery Method: Circle system utilized Preoxygenation: Pre-oxygenation with 100% oxygen Intubation Type: IV induction Ventilation: Mask ventilation without difficulty Laryngoscope Size: Mac and 4 Grade View: Grade I Tube type: Oral Tube size: 7.5 mm Number of attempts: 1 Airway Equipment and Method: Stylet Placement Confirmation: ETT inserted through vocal cords under direct vision,  positive ETCO2,  CO2 detector and breath sounds checked- equal and bilateral Secured at: 24 cm Tube secured with: Tape Dental Injury: Teeth and Oropharynx as per pre-operative assessment

## 2015-08-30 ENCOUNTER — Encounter (HOSPITAL_COMMUNITY): Payer: Self-pay | Admitting: Surgery

## 2015-08-30 LAB — CBC
HCT: 38.1 % — ABNORMAL LOW (ref 39.0–52.0)
Hemoglobin: 13.2 g/dL (ref 13.0–17.0)
MCH: 30.3 pg (ref 26.0–34.0)
MCHC: 34.6 g/dL (ref 30.0–36.0)
MCV: 87.6 fL (ref 78.0–100.0)
Platelets: 243 10*3/uL (ref 150–400)
RBC: 4.35 MIL/uL (ref 4.22–5.81)
RDW: 13.1 % (ref 11.5–15.5)
WBC: 9.8 10*3/uL (ref 4.0–10.5)

## 2015-08-30 LAB — MRSA PCR SCREENING: MRSA BY PCR: NEGATIVE

## 2015-08-30 MED ORDER — PIPERACILLIN-TAZOBACTAM 3.375 G IVPB 30 MIN
3.3750 g | Freq: Four times a day (QID) | INTRAVENOUS | Status: DC
  Administered 2015-08-30 – 2015-09-01 (×9): 3.375 g via INTRAVENOUS
  Filled 2015-08-30 (×19): qty 50

## 2015-08-30 MED ORDER — ASPIRIN EC 81 MG PO TBEC
81.0000 mg | DELAYED_RELEASE_TABLET | Freq: Every day | ORAL | Status: DC
  Administered 2015-08-30 – 2015-09-01 (×3): 81 mg via ORAL
  Filled 2015-08-30 (×3): qty 1

## 2015-08-30 MED ORDER — PIPERACILLIN-TAZOBACTAM 3.375 G IVPB 30 MIN
3.3750 g | Freq: Four times a day (QID) | INTRAVENOUS | Status: DC
  Administered 2015-08-30: 3.375 g via INTRAVENOUS
  Filled 2015-08-30 (×3): qty 50

## 2015-08-30 NOTE — Progress Notes (Signed)
1 Day Post-Op  Subjective: Comfortable.    Objective: Vital signs in last 24 hours: Temp:  [98.2 F (36.8 C)-101.3 F (38.5 C)] 101.3 F (38.5 C) (04/04 0800) Pulse Rate:  [63-122] 99 (04/04 0800) Resp:  [14-23] 14 (04/04 0800) BP: (86-133)/(51-87) 101/69 mmHg (04/04 0800) SpO2:  [96 %-100 %] 98 % (04/04 0800) Weight:  [87.998 kg (194 lb)] 87.998 kg (194 lb) (04/03 1615)    Intake/Output from previous day: 04/03 0701 - 04/04 0700 In: 1233.9 [I.V.:1183.9; IV Piggyback:50] Out: 27 [Urine:1; Stool:1; Blood:25] Intake/Output this shift: Total I/O In: 220 [P.O.:120; I.V.:100] Out: -   PE: General- In NAD Abdomen-slightly firm and distended, hypoactive bowel sounds, incisions clean and intact  Lab Results:   Recent Labs  08/29/15 0955 08/29/15 2229  WBC 14.7* 11.1*  HGB 14.8 13.0  HCT 41.5* 38.1*  PLT  --  205   BMET  Recent Labs  08/29/15 1113 08/29/15 2229  NA 138  --   K 4.0  --   CL 105  --   CO2 24  --   GLUCOSE 118*  --   BUN 24*  --   CREATININE 1.06 0.86  CALCIUM 8.8*  --    PT/INR No results for input(s): LABPROT, INR in the last 72 hours. Comprehensive Metabolic Panel:    Component Value Date/Time   NA 138 08/29/2015 1113   K 4.0 08/29/2015 1113   CL 105 08/29/2015 1113   CO2 24 08/29/2015 1113   BUN 24* 08/29/2015 1113   CREATININE 0.86 08/29/2015 2229   CREATININE 1.06 08/29/2015 1113   GLUCOSE 118* 08/29/2015 1113   CALCIUM 8.8* 08/29/2015 1113   AST 22 08/29/2015 1113   ALT 23 08/29/2015 1113   ALKPHOS 73 08/29/2015 1113   BILITOT 1.6* 08/29/2015 1113   PROT 7.1 08/29/2015 1113   ALBUMIN 3.9 08/29/2015 1113     Studies/Results: Ct Cta Abd/pel W/cm &/or W/o Cm  08/29/2015  CLINICAL DATA:  Evaluate for mesenteric ischemia. Fever on Friday. Lower abdominal and pelvic pain. EXAM: CTA ABDOMEN AND PELVIS wITHOUT AND WITH CONTRAST TECHNIQUE: Multidetector CT imaging of the abdomen and pelvis was performed using the standard protocol  during bolus administration of intravenous contrast. Multiplanar reconstructed images and MIPs were obtained and reviewed to evaluate the vascular anatomy. CONTRAST:  100 cc Isovue 370 IV COMPARISON:  None. FINDINGS: Lower chest: 11 mm nodule at the left lung base posteriorly. Right lung bases clear. No effusions. Heart is mildly enlarged. Hepatobiliary: No focal hepatic abnormality. Gallbladder unremarkable. Pancreas: No focal abnormality Spleen: No focal abnormality Adrenals/Urinary Tract: No adrenal abnormality. No focal renal abnormality. No stones or hydronephrosis. Urinary bladder is unremarkable. Stomach/Bowel: The appendix is dilated containing several high-density areas, likely appendicolith. Appendix dilated up to 17 mm. Surrounding inflammatory change in the pelvis. The appendix extends into the cul-de-sac with is inflammatory change. Adjacent small bowel loops also demonstrate wall thickening, likely secondarily infected as is the adjacent sigmoid colon. Stomach, large and small bowel otherwise grossly unremarkable. Vascular/Lymphatic: Scattered aortic calcifications. No aneurysm. Mild narrowing at the origin of the celiac artery. Superior mesenteric artery and inferior mesenteric artery are widely patent. Main renal arteries are widely patent. There is a very small accessory left renal artery arising from the distal aorta. Reproductive: Grossly unremarkable Other: Trace free fluid in the pelvis.  No free air. Musculoskeletal: No acute bony abnormality or focal bone lesion. Review of the MIP images confirms the above findings. IMPRESSION: Inflammatory process in the  pelvis which appears to be centered around the dilated appendix with several high density areas, likely appendicolith. Adjacent small bowel loops and sigmoid colon are also inflamed with wall thickening, likely secondarily. Findings most likely reflect acute appendicitis. Trace free fluid in the pelvis. Mild narrowing of the celiac origin.  Remainder of the mesenteric vessels and renal arteries are widely patent. 11 mm pleural-based nodule at the left lung base posteriorly. This could be followed with chest CT in 3-6 months. Electronically Signed   By: Rolm Baptise M.D.   On: 08/29/2015 13:55    Anti-infectives: Anti-infectives    Start     Dose/Rate Route Frequency Ordered Stop   08/30/15 0900  piperacillin-tazobactam (ZOSYN) IVPB 3.375 g    Comments:  Please Run Zosyn at 158mL/hr   3.375 g 100 mL/hr over 30 Minutes Intravenous Every 6 hours 08/30/15 0644     08/30/15 0300  piperacillin-tazobactam (ZOSYN) IVPB 3.375 g  Status:  Discontinued     3.375 g 100 mL/hr over 30 Minutes Intravenous Every 6 hours 08/30/15 0252 08/30/15 0646   08/29/15 2300  piperacillin-tazobactam (ZOSYN) IVPB 3.375 g  Status:  Discontinued     3.375 g 12.5 mL/hr over 240 Minutes Intravenous 3 times per day 08/29/15 2203 08/30/15 0250   08/29/15 2030  piperacillin-tazobactam (ZOSYN) IVPB 3.375 g  Status:  Discontinued     3.375 g 12.5 mL/hr over 240 Minutes Intravenous Every 8 hours 08/29/15 1812 08/29/15 2203   08/29/15 1415  piperacillin-tazobactam (ZOSYN) IVPB 4.5 g  Status:  Discontinued     4.5 g 200 mL/hr over 30 Minutes Intravenous  Once 08/29/15 1403 08/29/15 1405   08/29/15 1415  piperacillin-tazobactam (ZOSYN) IVPB 3.375 g     3.375 g 100 mL/hr over 30 Minutes Intravenous  Once 08/29/15 1405 08/29/15 1502      Assessment Active Problems:   Acute gangrenous appendicitis with secondary inflammatory changes of small bowel and sigmoid colon-fever of 101.3 this AM    Chronic afib-off Cardizem gtt with adequate rate control; on chronic ASA vs anti-coagulant per PCP, Dr. Burt Knack    LOS: 1 day   Plan: Transfer to telemetry.  Continue IV Zosyn.  Clear liquids.  Restart chronic ASA.  Check CBC tomorrow.   Adamarie Izzo J 08/30/2015

## 2015-08-30 NOTE — Care Management Note (Signed)
Case Management Note  Patient Details  Name: Javeon Fuell MRN: [id number] Date of Birth: May 21, 1956  Subjective/Objective:                 Gangrene appenditis with rupture   Action/Plan:Date:  August 30, 2015 Chart reviewed for concurrent status and case management needs. Will continue to follow patient for changes and needs: Velva Harman, BSN, RN, [place]   [phone]   Expected Discharge Date:   (unkown)               Expected Discharge Plan:  Home/Self Care  In-House Referral:  NA  Discharge planning Services  CM Consult  Post Acute Care Choice:  NA Choice offered to:  NA  DME Arranged:    DME Agency:     HH Arranged:    [place] Agency:     Status of Service:  Completed, signed off  Medicare Important Message Given:    Date Medicare IM Given:    Medicare IM give by:    Date Additional Medicare IM Given:    Additional Medicare Important Message give by:     If discussed at [place] of Stay Meetings, dates discussed:    Additional Comments:  Leeroy Cha, RN 08/30/2015, 10:48 AM

## 2015-08-30 NOTE — Progress Notes (Signed)
Spoke to Dr. Hassell Done earlier in the evening regarding patient orders.  Dr. Hassell Done ordered to continue Cardizem during the night to control HR as needed,  Pt remains in afib.  He also is aware of pt low grade fever.  No other orders given.

## 2015-08-30 NOTE — Progress Notes (Signed)
Received from ICU, alert and oriented, 3 abd lap sites  Dry and intact.No complaints of pain or discomfort at this time

## 2015-08-31 DIAGNOSIS — K35891 Other acute appendicitis without perforation, with gangrene: Secondary | ICD-10-CM | POA: Diagnosis present

## 2015-08-31 MED ORDER — ENSURE ENLIVE PO LIQD: 237.0000 mL | Freq: Two times a day (BID) | ORAL | Status: DC

## 2015-08-31 NOTE — Progress Notes (Signed)
   08/31/15 1600  Clinical Encounter Type  Visited With Patient;Other (Comment) (Girlfriend)  Visit Type Initial;Follow-up  Referral From Patient  Spiritual Encounters  Spiritual Needs Emotional  CH requested by patient to visit and meet girlfriend who is [place] in [place], [place]. [place] discussed with girlfriend [place] and also offered  Spiritual and emotional support. Gwynn Burly 4:29 PM

## 2015-08-31 NOTE — Progress Notes (Signed)
Initial Nutrition Assessment  DOCUMENTATION CODES:   Not applicable  INTERVENTION:  -Ensure Enlive BID. Each supplement provides 350 kcals and 20 grams of protein.  -Monitor for nutritional needs.    NUTRITION DIAGNOSIS:   Inadequate oral intake related to other (see comment) (Prescribed diet (CL, FL)) as evidenced by other (see comment) (nutritional value of clear liquid diet ).  GOAL:   Patient will meet greater than or equal to 90% of their needs  MONITOR:   Supplement acceptance, PO intake, Diet advancement, Labs, Weight trends, Skin, I & O's  REASON FOR ASSESSMENT:   Malnutrition Screening Tool    ASSESSMENT:   60 y/o white male with AFIB, retinal detachment, and anxiety presents to [facility] with abdominal pain since Saturday night. Central abdominal pain which is a constant ache in nature. Radiates to the entire abdomen. No N/V. Subjective fevers/chills, fatigue. Dark - black BM's. Heme-occult positive at [facility]. No SOB/CP, dysuria. No precipitating/alleviating factors. He tried pepto bismol, Gatorade, and bland diet, but appetite has been poor. Last meal was last night. 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 [facility] [place] told him to go to the ER today because they thought he had a perforated ulcer. He has a h/o Detached retina Surgery twice. Takes Asprin for AFIB, Bupropion for anxiety, propanolol for performance anxiety. His PCP is Dr. Burt Knack in [facility]. No past surgical history. Past family history - mother died of pancreatic cancer, dad died of brain cancer.   Pt seen for MST. Pt BMI categorized as healthy. No weight history found in chart. Pt reports his UBW of 185-195 lbs. Pt states his weight has gradually decreased over the past several years d/t aging. Pt reports a 8-10 lb weight loss in a time frame of >1 year. This is not significant weight loss. Per chart, pt is POD #2 of laparoscopic appendectomy. Pt is  currently on a CL diet and per surgery note pt will be advanced to [facility] today. Intern went over the [facility] diet with pt.    Pt reports normal appetite, eating 4 times per day, and no pain or N/V with eating. Diet recall obtained from pt. Breakfast is oatmeal and then pt, who travels for work, eats several salads throughout the day. Pt reports not eating starch because it causes bloating.Two days PTA, pt reports experiencing abdominal pain and decrease in appetite which he associated with his recent diagnosis of appendicitis. Pt last meal was dinner on 4/2. Pt reports eating chicken noodle soup, coffee, and water on his CL diet. Per surgery note, pt experienced some abdominal discomfort and bloating after eating chicken noodle soup. Pt states he wants to eat a grilled cheese sandwich which indicates his appetite is good. Based on assessment, pt should be able to meet needs once diet is advanced. RD will order Ensure Enlive when diet is advanced to [facility] to make sure he is meeting his protein needs. Boost Breeze will not be ordered for pt on CL because RN and physician have indicated diet advancing to [facility] today.   NFPE: No muscle depletion, no fat depletion, no edema.   Labs reviewed;  Meds reviewed; D5-NS-20 mEq KCl at 100 ml/hr (408 kcals)  Diet Order:  Diet clear liquid Room service appropriate?: Yes; Fluid consistency:: Thin  Skin:  Reviewed, no issues  Last BM:  4/4  Height:   Ht Readings from Last 1 Encounters:  08/29/15 6\' 8"  (2.032 m)    Weight:   Wt  Readings from Last 1 Encounters:  08/29/15 194 lb (87.998 kg)    Ideal Body Weight:  102.7 kg  BMI:  Body mass index is 21.31 kg/(m^2).  Estimated Nutritional Needs:   Kcal:  2100-2300 kcals (24-26 kcals/kg)  Protein:  95-105 g (1.1-1.2 g/kg)  Fluid:  2.1-2.3 L  EDUCATION NEEDS:   No education needs identified at this time  Geoffery Lyons, [place] Dietetic Intern Pager [phone]

## 2015-08-31 NOTE — Progress Notes (Signed)
2 Days Post-Op  Subjective: Had some brown BMs.  Not passing much gas.  Belching.  Got bloated after eating chicken noodle soup earlier this AM.   Objective: Vital signs in last 24 hours: Temp:  [97.9 F (36.6 C)-98.6 F (37 C)] 97.9 F (36.6 C) (04/05 0400) Pulse Rate:  [58-95] 87 (04/05 0400) Resp:  [12-24] 20 (04/05 0400) BP: (101-111)/(50-76) 111/75 mmHg (04/05 0400) SpO2:  [93 %-100 %] 94 % (04/05 0400) Last BM Date: 08/30/15  Intake/Output from previous day: 04/04 0701 - 04/05 0700 In: 2050 [P.O.:600; I.V.:1300; IV Piggyback:150] Out: 1350 [Urine:1350] Intake/Output this shift:    PE: General- In NAD Abdomen-slightly firm and distended, hypoactive bowel sounds, incisions clean and intact  Lab Results:   Recent Labs  08/29/15 2229 08/30/15 2244  WBC 11.1* 9.8  HGB 13.0 13.2  HCT 38.1* 38.1*  PLT 205 243   BMET  Recent Labs  08/29/15 1113 08/29/15 2229  NA 138  --   K 4.0  --   CL 105  --   CO2 24  --   GLUCOSE 118*  --   BUN 24*  --   CREATININE 1.06 0.86  CALCIUM 8.8*  --    PT/INR No results for input(s): LABPROT, INR in the last 72 hours. Comprehensive Metabolic Panel:    Component Value Date/Time   NA 138 08/29/2015 1113   K 4.0 08/29/2015 1113   CL 105 08/29/2015 1113   CO2 24 08/29/2015 1113   BUN 24* 08/29/2015 1113   CREATININE 0.86 08/29/2015 2229   CREATININE 1.06 08/29/2015 1113   GLUCOSE 118* 08/29/2015 1113   CALCIUM 8.8* 08/29/2015 1113   AST 22 08/29/2015 1113   ALT 23 08/29/2015 1113   ALKPHOS 73 08/29/2015 1113   BILITOT 1.6* 08/29/2015 1113   PROT 7.1 08/29/2015 1113   ALBUMIN 3.9 08/29/2015 1113     Studies/Results: Ct Cta Abd/pel W/cm &/or W/o Cm  08/29/2015  CLINICAL DATA:  Evaluate for mesenteric ischemia. Fever on Friday. Lower abdominal and pelvic pain. EXAM: CTA ABDOMEN AND PELVIS wITHOUT AND WITH CONTRAST TECHNIQUE: Multidetector CT imaging of the abdomen and pelvis was performed using the standard protocol  during bolus administration of intravenous contrast. Multiplanar reconstructed images and MIPs were obtained and reviewed to evaluate the vascular anatomy. CONTRAST:  100 cc Isovue 370 IV COMPARISON:  None. FINDINGS: Lower chest: 11 mm nodule at the left lung base posteriorly. Right lung bases clear. No effusions. Heart is mildly enlarged. Hepatobiliary: No focal hepatic abnormality. Gallbladder unremarkable. Pancreas: No focal abnormality Spleen: No focal abnormality Adrenals/Urinary Tract: No adrenal abnormality. No focal renal abnormality. No stones or hydronephrosis. Urinary bladder is unremarkable. Stomach/Bowel: The appendix is dilated containing several high-density areas, likely appendicolith. Appendix dilated up to 17 mm. Surrounding inflammatory change in the pelvis. The appendix extends into the cul-de-sac with is inflammatory change. Adjacent small bowel loops also demonstrate wall thickening, likely secondarily infected as is the adjacent sigmoid colon. Stomach, large and small bowel otherwise grossly unremarkable. Vascular/Lymphatic: Scattered aortic calcifications. No aneurysm. Mild narrowing at the origin of the celiac artery. Superior mesenteric artery and inferior mesenteric artery are widely patent. Main renal arteries are widely patent. There is a very small accessory left renal artery arising from the distal aorta. Reproductive: Grossly unremarkable Other: Trace free fluid in the pelvis.  No free air. Musculoskeletal: No acute bony abnormality or focal bone lesion. Review of the MIP images confirms the above findings. IMPRESSION: Inflammatory process in the  pelvis which appears to be centered around the dilated appendix with several high density areas, likely appendicolith. Adjacent small bowel loops and sigmoid colon are also inflamed with wall thickening, likely secondarily. Findings most likely reflect acute appendicitis. Trace free fluid in the pelvis. Mild narrowing of the celiac origin.  Remainder of the mesenteric vessels and renal arteries are widely patent. 11 mm pleural-based nodule at the left lung base posteriorly. This could be followed with chest CT in 3-6 months. Electronically Signed   By: Rolm Baptise M.D.   On: 08/29/2015 13:55    Anti-infectives: Anti-infectives    Start     Dose/Rate Route Frequency Ordered Stop   08/30/15 0900  piperacillin-tazobactam (ZOSYN) IVPB 3.375 g    Comments:  Please Run Zosyn at 142mL/hr   3.375 g 100 mL/hr over 30 Minutes Intravenous Every 6 hours 08/30/15 0644     08/30/15 0300  piperacillin-tazobactam (ZOSYN) IVPB 3.375 g  Status:  Discontinued     3.375 g 100 mL/hr over 30 Minutes Intravenous Every 6 hours 08/30/15 0252 08/30/15 0646   08/29/15 2300  piperacillin-tazobactam (ZOSYN) IVPB 3.375 g  Status:  Discontinued     3.375 g 12.5 mL/hr over 240 Minutes Intravenous 3 times per day 08/29/15 2203 08/30/15 0250   08/29/15 2030  piperacillin-tazobactam (ZOSYN) IVPB 3.375 g  Status:  Discontinued     3.375 g 12.5 mL/hr over 240 Minutes Intravenous Every 8 hours 08/29/15 1812 08/29/15 2203   08/29/15 1415  piperacillin-tazobactam (ZOSYN) IVPB 4.5 g  Status:  Discontinued     4.5 g 200 mL/hr over 30 Minutes Intravenous  Once 08/29/15 1403 08/29/15 1405   08/29/15 1415  piperacillin-tazobactam (ZOSYN) IVPB 3.375 g     3.375 g 100 mL/hr over 30 Minutes Intravenous  Once 08/29/15 1405 08/29/15 1502      Assessment Active Problems:   Acute gangrenous appendicitis with secondary inflammatory changes of small bowel and sigmoid colon-no further fever, WBC normal, mild ileus    Chronic afib-off Cardizem gtt with adequate rate control; on chronic ASA     LOS: 2 days   Plan: Advance to full liquid diet.  Continue IV Zosyn.    Jose Holden J 08/31/2015

## 2015-09-01 MED ORDER — HYDROCODONE-ACETAMINOPHEN 5-325 MG PO TABS: 1.0000 | ORAL_TABLET | ORAL | Status: DC | PRN

## 2015-09-01 MED ORDER — AMOXICILLIN-POT CLAVULANATE 875-125 MG PO TABS: 1.0000 | ORAL_TABLET | Freq: Two times a day (BID) | ORAL | Status: DC

## 2015-09-01 NOTE — Discharge Summary (Signed)
  [facility] Discharge Summary   Patient ID: Jose Holden MRN: [id number] DOB/AGE: Jan 18, 1956 60 y.o.  Admit date: 08/29/2015 Discharge date: 09/01/2015  Admitting Diagnosis: Acute appendicitis  Discharge Diagnosis Patient Active Problem List   Diagnosis Date Noted  . Gangrenous appendicitis 08/31/2015  . Acute appendicitis 08/29/2015    Consultants None  Imaging: No results found.  Procedures Dr. Hassell Done (08/29/15) - Laparoscopic Appendectomy  Hospital Course:  60 y/o white male with AFIB, retinal detachment, and anxiety presents to [facility] with abdominal pain since Saturday night. Central abdominal pain which is a constant ache in nature. Radiates to the entire abdomen. No N/V. Subjective fevers/chills, fatigue. Dark - black BM's. Heme-occult positive at [facility]. No SOB/CP, dysuria. No precipitating/alleviating factors. He tried pepto bismol, Gatorade, and bland diet, but appetite has been poor. Last meal was last night. 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 [facility] [place] told him to go to the ER today because they thought he had a perforated ulcer. He has a h/o Detached retina Surgery twice. Takes Asprin for AFIB, Bupropion for anxiety, propanolol for performance anxiety. His PCP is Dr. Burt Knack in [facility]. No past surgical history. Past family history - mother died of pancreatic cancer, dad died of brain cancer.   Workup showed acute appendicitis with leukocytosis.  Patient was admitted and underwent procedure listed above.  Tolerated procedure well and was transferred to the floor.  His appendix was found to be gangrenous (see picture in media section of epic).  He had a mild post-operative ileus.  Diet was advanced as tolerated.  On POD #3, the patient was voiding well, tolerating diet, ambulating well, pain well controlled, vital signs stable, incisions c/d/i and felt stable for discharge home.  Patient will  follow up in our office in 2 weeks and knows to call with questions or concerns.  He will finish a course of Augmentin upon discharge (5 additional days).     Medication List    STOP taking these medications        bismuth subsalicylate [id number] suspension  Commonly known as:  PEPTO BISMOL      TAKE these medications        amoxicillin-clavulanate 875-125 MG tablet  Commonly known as:  AUGMENTIN  Take 1 tablet by mouth 2 (two) times daily.     aspirin 325 MG tablet  Take 325 mg by mouth daily.     buPROPion 150 MG 24 hr tablet  Commonly known as:  WELLBUTRIN XL  Take 150 mg by mouth daily.     HYDROcodone-acetaminophen 5-325 MG tablet  Commonly known as:  NORCO/VICODIN  Take 1-2 tablets by mouth every 4 (four) hours as needed for moderate pain.         Follow-up Information    Follow up with [facility]. Go on 09/13/2015.   Specialty:  General Surgery   Why:  For post-operation check.  Your appointment is on 09/13/2015 at 3:00pm, please arrive no later than 2:30pm to check in and fill out paperwork.  If you can't make your appointment please call to change date/time.   Contact information:   [address] [place] [phone]      Signed: Vaughan Basta [facility] [phone]  09/01/2015, 10:40 AM

## 2015-09-01 NOTE — Discharge Instructions (Signed)
LAPAROSCOPIC SURGERY: POST OP INSTRUCTIONS  1. DIET: Follow a lowfat diet.  Be sure to include lots of fluids daily.  Avoid fast food or heavy meals as your are more likely to get nauseated. Take your usual prescribed home medications unless otherwise directed. 2. PAIN CONTROL: a. Pain is best controlled by a usual combination of three different methods TOGETHER: i. Ice/Heat ii. Over the counter pain medication iii. Prescription pain medication b. Most patients will experience some swelling and bruising around the incisions.  Ice packs or heating pads (30-60 minutes up to 6 times a day) will help. Use ice for the first few days to help decrease swelling and bruising, then switch to heat to help relax tight/sore spots and speed recovery.  Some people prefer to use ice alone, heat alone, alternating between ice & heat.  Experiment to what works for you.  Swelling and bruising can take several weeks to resolve.   c. It is helpful to take an over-the-counter pain medication regularly for the first few weeks.  Choose one of the following that works best for you: i. Naproxen (Aleve, etc)  Two 220mg  tabs twice a day ii. Ibuprofen (Advil, etc) Three 200mg  tabs four times a day (every meal & bedtime) iii. Acetaminophen (Tylenol, etc) 500-650mg  four times a day (every meal & bedtime) d. A  prescription for pain medication (such as oxycodone, hydrocodone, etc) should be given to you upon discharge.  Take your pain medication as prescribed.  i. If you are having problems/concerns with the prescription medicine (does not control pain, nausea, vomiting, rash, itching, etc), please call us [phone] to see if we need to switch you to a different pain medicine that will work better for you and/or control your side effect better. ii. If you need a refill on your pain medication, please contact your pharmacy.  They will contact our office to request authorization. Prescriptions will not be filled after 5 pm or on  week-ends. 3. Avoid getting constipated.  Between the surgery and the pain medications, it is common to experience some constipation.  Increasing fluid intake and taking a fiber supplement (such as Metamucil, Citrucel, FiberCon, MiraLax, etc) 1-2 times a day regularly will usually help prevent this problem from occurring.  A mild laxative (prune juice, Milk of Magnesia, MiraLax, etc) should be taken according to package directions if there are no bowel movements after 48 hours.   4. Watch out for diarrhea.  If you have many loose bowel movements, simplify your diet to bland foods & liquids for a few days.  Stop any stool softeners and decrease your fiber supplement.  Switching to mild anti-diarrheal medications (Kayopectate, Pepto Bismol) can help.  If this worsens or does not improve, please call us. 5. Wash / shower every day.  You may shower over the dressings as they are waterproof.  Continue to shower over incision(s) after the dressing is off.  6. ACTIVITIES as tolerated:   a. You may resume regular (light) daily activities beginning the next day--such as daily self-care, walking, climbing stairs--gradually increasing light activities as tolerated.  No heavy lifting (over 10 pounds), straining, or intense activities for 2 weeks. b. DO NOT PUSH THROUGH PAIN.  Let pain be your guide: If it hurts to do something, don't do it.  Pain is your body warning you to avoid that activity for another week until the pain goes down. c. You may drive when you are no longer taking prescription pain medication, you can comfortably  wear a seatbelt, and you can safely maneuver your car and apply brakes. d. Dennis Bast may have sexual intercourse when it is comfortable.  7. FOLLOW UP in our office a. Please call [facility] at [phone] to set up an appointment to see your surgeon in the office for a follow-up appointment approximately 2-3 weeks after your surgery. b. Make sure that you call for this appointment the day you arrive  home to insure a convenient appointment time. 10. IF YOU HAVE DISABILITY OR FAMILY LEAVE FORMS, BRING THEM TO THE OFFICE FOR PROCESSING.  DO NOT GIVE THEM TO YOUR DOCTOR.  11.  Return to work/school:  Desk work/light activities in 5-7 days, full duty/activities in 2 weeks if pain-free.  12.  Take antibiotic as directed.  Take an over the counter probiotic each day you are taking an antibiotic.   WHEN TO CALL us [phone]: 1. Poor pain control 2. Reactions / problems with new medications (rash/itching, nausea, etc)  3. Fever over 101.5 F (38.5 C) 4. Inability to urinate 5. Nausea and/or vomiting 6. Worsening swelling or bruising 7. Continued bleeding from incision. 8. Increased pain, redness, or drainage from the incision   The clinic staff is available to answer your questions during regular business hours (8:30am-5pm).  Please dont hesitate to call and ask to speak to one of our nurses for clinical concerns.   If you have a medical emergency, go to the nearest emergency room or call 911.  A surgeon from [facility] is always on call at the [facility], [place], [place], [place], [place]  [id number] ? MAIN: [phone] ? TOLL FREE: [phone] ?  FAX [phone] [email or web address]

## 2015-09-01 NOTE — Progress Notes (Signed)
3 Days Post-Op  Subjective: Tolerating solid diet without n/v.  Passing gas.  Bowels moving.  Still a little bloated.   Objective: Vital signs in last 24 hours: Temp:  [98 F (36.7 C)-99.8 F (37.7 C)] 99.8 F (37.7 C) (04/06 0416) Pulse Rate:  [85-91] 91 (04/06 0416) Resp:  [18-20] 18 (04/06 0416) BP: (97-108)/(62-70) 106/66 mmHg (04/06 0416) SpO2:  [95 %] 95 % (04/06 0416) Last BM Date: 08/31/15  Intake/Output from previous day: 04/05 0701 - 04/06 0700 In: 3260 [P.O.:660; I.V.:2400; IV Piggyback:200] Out: -  Intake/Output this shift:    PE: General- In NAD Abdomen-softer and less distended, incisions clean and intact  Lab Results:   Recent Labs  08/29/15 2229 08/30/15 2244  WBC 11.1* 9.8  HGB 13.0 13.2  HCT 38.1* 38.1*  PLT 205 243   BMET  Recent Labs  08/29/15 1113 08/29/15 2229  NA 138  --   K 4.0  --   CL 105  --   CO2 24  --   GLUCOSE 118*  --   BUN 24*  --   CREATININE 1.06 0.86  CALCIUM 8.8*  --    PT/INR No results for input(s): LABPROT, INR in the last 72 hours. Comprehensive Metabolic Panel:    Component Value Date/Time   NA 138 08/29/2015 1113   K 4.0 08/29/2015 1113   CL 105 08/29/2015 1113   CO2 24 08/29/2015 1113   BUN 24* 08/29/2015 1113   CREATININE 0.86 08/29/2015 2229   CREATININE 1.06 08/29/2015 1113   GLUCOSE 118* 08/29/2015 1113   CALCIUM 8.8* 08/29/2015 1113   AST 22 08/29/2015 1113   ALT 23 08/29/2015 1113   ALKPHOS 73 08/29/2015 1113   BILITOT 1.6* 08/29/2015 1113   PROT 7.1 08/29/2015 1113   ALBUMIN 3.9 08/29/2015 1113     Studies/Results: No results found.  Anti-infectives: Anti-infectives    Start     Dose/Rate Route Frequency Ordered Stop   08/30/15 0900  piperacillin-tazobactam (ZOSYN) IVPB 3.375 g    Comments:  Please Run Zosyn at 156mL/hr   3.375 g 100 mL/hr over 30 Minutes Intravenous Every 6 hours 08/30/15 0644     08/30/15 0300  piperacillin-tazobactam (ZOSYN) IVPB 3.375 g  Status:  Discontinued      3.375 g 100 mL/hr over 30 Minutes Intravenous Every 6 hours 08/30/15 0252 08/30/15 0646   08/29/15 2300  piperacillin-tazobactam (ZOSYN) IVPB 3.375 g  Status:  Discontinued     3.375 g 12.5 mL/hr over 240 Minutes Intravenous 3 times per day 08/29/15 2203 08/30/15 0250   08/29/15 2030  piperacillin-tazobactam (ZOSYN) IVPB 3.375 g  Status:  Discontinued     3.375 g 12.5 mL/hr over 240 Minutes Intravenous Every 8 hours 08/29/15 1812 08/29/15 2203   08/29/15 1415  piperacillin-tazobactam (ZOSYN) IVPB 4.5 g  Status:  Discontinued     4.5 g 200 mL/hr over 30 Minutes Intravenous  Once 08/29/15 1403 08/29/15 1405   08/29/15 1415  piperacillin-tazobactam (ZOSYN) IVPB 3.375 g     3.375 g 100 mL/hr over 30 Minutes Intravenous  Once 08/29/15 1405 08/29/15 1502      Assessment Active Problems:   Acute gangrenous appendicitis with secondary inflammatory changes of small bowel and sigmoid colon-bowel function returning   Chronic afib-off Cardizem gtt with adequate rate control; on chronic ASA     LOS: 3 days   Plan:  Discharge today on oral antibiotics.  Discharge instructions discussed with him.   Keily Lepp J 09/01/2015

## 2015-09-01 NOTE — H&P (Signed)
Duplicate H&P replaced today due to first one being categorized as a progress note.  [facility] Surgery Admission Note  Jose Holden [facility] Apr 08, 1956  [id number].   Requesting MD: Dr. Laneta Simmers Chief Complaint/Reason for Consult: Acute appendicitis  HPI:  60 y/o white male with AFIB, retinal detachment, and anxiety presents to [facility] with abdominal pain since Saturday night. Central abdominal pain which is a constant ache in nature. Radiates to the entire abdomen. No N/V. Subjective fevers/chills, fatigue. Dark - black BM's. Heme-occult positive at [facility]. No SOB/CP, dysuria. No precipitating/alleviating factors. He tried pepto bismol, Gatorade, and bland diet, but appetite has been poor. Last meal was last night. 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 [facility] [place] told him to go to the ER today because they thought he had a perforated ulcer. He has a h/o Detached retina Surgery twice. Takes Asprin for AFIB, Bupropion for anxiety, propanolol for performance anxiety. His PCP is Dr. Burt Knack in [facility]. No past surgical history. Past family history - mother died of pancreatic cancer, dad died of brain cancer.   ROS: All systems reviewed and otherwise negative except for as above  Family History  Problem Relation Age of Onset  . Cancer Mother   . Cancer Father     Past Medical History  Diagnosis Date  . Anxiety   . Cataract   . Heart murmur     Past Surgical History  Procedure Laterality Date  . Eye surgery      Social History:  reports that he has never smoked. He does not have any smokeless tobacco history on file. His alcohol and drug histories are not on file.  Allergies: No Known Allergies   (Not in a hospital admission)  Blood pressure 130/79, pulse 66, temperature 98.6 F (37 C), temperature source Oral, resp. rate 16, SpO2 96 %. Physical Exam: General: pleasant, tall  and thin, WD/WN white male who is laying in bed in NAD HEENT: head is normocephalic, atraumatic. Sclera are noninjected. PERRL. Ears and nose without any masses or lesions. Mouth is pink and moist Heart: regularly irregular. No obvious murmurs, gallops, or rubs noted. Palpable pedal pulses bilaterally. Lungs: CTAB, no wheezes, rhonchi, or rales noted. Respiratory effort nonlabored Abd: soft, mild distension, diffusely tender especially centrally, +BS, no masses, hernias, or organomegaly MS: all 4 extremities are symmetrical with no cyanosis, clubbing, or edema. Skin: warm and dry with no masses, lesions, or rashes Psych: A&Ox3 with an appropriate affect.    Lab Results Last 48 Hours    Results for orders placed or performed during the hospital encounter of 08/29/15 (from the past 48 hour(s))  Comprehensive metabolic panel Status: Abnormal   Collection Time: 08/29/15 11:13 AM  Result Value Ref Range   Sodium 138 135 - 145 mmol/L   Potassium 4.0 3.5 - 5.1 mmol/L   Chloride 105 101 - 111 mmol/L   CO2 24 22 - 32 mmol/L   Glucose, Bld 118 (H) 65 - 99 mg/dL   BUN 24 (H) 6 - 20 mg/dL   Creatinine, Ser 1.06 0.61 - 1.24 mg/dL   Calcium 8.8 (L) 8.9 - 10.3 mg/dL   Total Protein 7.1 6.5 - 8.1 g/dL   Albumin 3.9 3.5 - 5.0 g/dL   AST 22 15 - 41 U/L   ALT 23 17 - 63 U/L   Alkaline Phosphatase 73 38 - 126 U/L   Total Bilirubin 1.6 (H) 0.3 - 1.2 mg/dL   GFR calc non  Af Amer >60 >60 mL/min   GFR calc Af Amer >60 >60 mL/min    Comment: (NOTE) The eGFR has been calculated using the CKD EPI equation. This calculation has not been validated in all clinical situations. eGFR's persistently <60 mL/min signify possible Chronic Kidney Disease.    Anion gap 9 5 - 15  Type and screen [facility] Status: None   Collection Time: 08/29/15 11:13 AM  Result Value Ref Range   ABO/RH(D) A  NEG    Antibody Screen NEG    Sample Expiration 09/01/2015   Lipase, blood Status: None   Collection Time: 08/29/15 11:13 AM  Result Value Ref Range   Lipase 26 11 - 51 U/L  ABO/Rh Status: None   Collection Time: 08/29/15 11:13 AM  Result Value Ref Range   ABO/RH(D) A NEG   Urinalysis, Routine w reflex microscopic Status: Abnormal   Collection Time: 08/29/15 1:05 PM  Result Value Ref Range   Color, Urine AMBER (A) YELLOW    Comment: BIOCHEMICALS MAY BE AFFECTED BY COLOR   APPearance CLEAR CLEAR   Specific Gravity, Urine 1.029 1.005 - 1.030   pH 5.5 5.0 - 8.0   Glucose, UA NEGATIVE NEGATIVE mg/dL   Hgb urine dipstick NEGATIVE NEGATIVE   Bilirubin Urine SMALL (A) NEGATIVE   Ketones, ur 40 (A) NEGATIVE mg/dL   Protein, ur 30 (A) NEGATIVE mg/dL   Nitrite NEGATIVE NEGATIVE   Leukocytes, UA NEGATIVE NEGATIVE  Urine microscopic-add on Status: Abnormal   Collection Time: 08/29/15 1:05 PM  Result Value Ref Range   Squamous Epithelial / LPF 0-5 (A) NONE SEEN   WBC, UA 0-5 0 - 5 WBC/hpf   RBC / HPF 0-5 0 - 5 RBC/hpf   Bacteria, UA RARE (A) NONE SEEN   Urine-Other MUCOUS PRESENT   I-Stat CG4 Lactic Acid, ED Status: None   Collection Time: 08/29/15 2:38 PM  Result Value Ref Range   Lactic Acid, Venous 0.71 0.5 - 2.0 mmol/L      Imaging Results (Last 48 hours)    Ct Cta Abd/pel W/cm &/or W/o Cm  08/29/2015 CLINICAL DATA: Evaluate for mesenteric ischemia. Fever on Friday. Lower abdominal and pelvic pain. EXAM: CTA ABDOMEN AND PELVIS wITHOUT AND WITH CONTRAST TECHNIQUE: Multidetector CT imaging of the abdomen and pelvis was performed using the standard protocol during bolus administration of intravenous contrast. Multiplanar reconstructed images and MIPs were obtained and reviewed to evaluate the vascular anatomy. CONTRAST: 100 cc Isovue 370 IV  COMPARISON: None. FINDINGS: Lower chest: 11 mm nodule at the left lung base posteriorly. Right lung bases clear. No effusions. Heart is mildly enlarged. Hepatobiliary: No focal hepatic abnormality. Gallbladder unremarkable. Pancreas: No focal abnormality Spleen: No focal abnormality Adrenals/Urinary Tract: No adrenal abnormality. No focal renal abnormality. No stones or hydronephrosis. Urinary bladder is unremarkable. Stomach/Bowel: The appendix is dilated containing several high-density areas, likely appendicolith. Appendix dilated up to 17 mm. Surrounding inflammatory change in the pelvis. The appendix extends into the cul-de-sac with is inflammatory change. Adjacent small bowel loops also demonstrate wall thickening, likely secondarily infected as is the adjacent sigmoid colon. Stomach, large and small bowel otherwise grossly unremarkable. Vascular/Lymphatic: Scattered aortic calcifications. No aneurysm. Mild narrowing at the origin of the celiac artery. Superior mesenteric artery and inferior mesenteric artery are widely patent. Main renal arteries are widely patent. There is a very small accessory left renal artery arising from the distal aorta. Reproductive: Grossly unremarkable Other: Trace free fluid in the pelvis. No free   air. Musculoskeletal: No acute bony abnormality or focal bone lesion. Review of the MIP images confirms the above findings. IMPRESSION: Inflammatory process in the pelvis which appears to be centered around the dilated appendix with several high density areas, likely appendicolith. Adjacent small bowel loops and sigmoid colon are also inflamed with wall thickening, likely secondarily. Findings most likely reflect acute appendicitis. Trace free fluid in the pelvis. Mild narrowing of the celiac origin. Remainder of the mesenteric vessels and renal arteries are widely patent. 11 mm pleural-based nodule at the left lung base posteriorly. This could be followed with chest CT in 3-6 months.  Electronically Signed By: Rolm Baptise M.D. On: 08/29/2015 13:55       Assessment/Plan Acute appendicitis Leukocytosis - 14.7 AFIB on aspirin only Anxiety/performance anxiety  Plan: 1. Admit to [facility], urgent lap appy 2. NPO, bowel rest, IVF, pain control, antiemetics, antibiotics (Zosyn already given 1 dose) 3. Discussed risks/benefits of procedure, post-operative care and requirements for discharge home tomorrow if all goes well.  4. All questions were sought and answered. He would like to proceed with surgery.    Nat Christen, [facility] 08/29/2015, 3:06 PM Pager: [phone] (7am - 4:30pm M-F; 7am - 11:30am Sa/Su)

## 2016-05-09 DIAGNOSIS — R911 Solitary pulmonary nodule: Secondary | ICD-10-CM | POA: Insufficient documentation

## 2017-11-20 DIAGNOSIS — R894 Abnormal immunological findings in specimens from other organs, systems and tissues: Secondary | ICD-10-CM | POA: Insufficient documentation

## 2018-02-10 ENCOUNTER — Telehealth: Payer: Self-pay | Admitting: Internal Medicine

## 2018-02-10 NOTE — Telephone Encounter (Signed)
To clarify previous message on 02/10/18 at 10:43 am Patient called to say he was referred to Dr Sharlet Salina by Dr Clarisa Fling from [facility]  To establish care with her  as his pcp.

## 2018-02-10 NOTE — Telephone Encounter (Signed)
Copied from [place] [phone]. Topic: General - Other >> Feb 10, 2018 10:39 AM Cecelia Byars, NT wrote: Reason for CRM: Patient called and called and said he was referred to her by Dr Clarisa Fling at [facility] on [address] in [place] , in please call him at [phone]

## 2018-02-10 NOTE — Telephone Encounter (Signed)
Spoke with patient. He states his PCP @ [facility] has recommended Dr. Sharlet Salina as a PCP for him. He moved to [place] a year ago.   He has been informed Dr.Crawford is not accepting NP, he states if she would recommend a doctor for him, if she does not accept him.

## 2018-02-12 NOTE — Telephone Encounter (Signed)
LVM to inform patient he has been approved.   Please set up a new patient, only 1 day - type will be OV in the note NEW PATIENT/Insurance info.

## 2018-02-12 NOTE — Telephone Encounter (Signed)
Fine to schedule, no more than 1 new patient daily

## 2018-02-13 ENCOUNTER — Encounter (INDEPENDENT_AMBULATORY_CARE_PROVIDER_SITE_OTHER): Payer: Self-pay

## 2018-02-13 ENCOUNTER — Encounter: Payer: Self-pay | Admitting: Internal Medicine

## 2018-02-13 ENCOUNTER — Ambulatory Visit: Payer: BLUE CROSS/BLUE SHIELD | Admitting: Internal Medicine

## 2018-02-13 VITALS — BP 98/60 | HR 79 | Temp 97.7°F | Ht >= 80 in | Wt 198.0 lb

## 2018-02-13 DIAGNOSIS — I482 Chronic atrial fibrillation, unspecified: Secondary | ICD-10-CM | POA: Insufficient documentation

## 2018-02-13 DIAGNOSIS — Z23 Encounter for immunization: Secondary | ICD-10-CM

## 2018-02-13 DIAGNOSIS — F5101 Primary insomnia: Secondary | ICD-10-CM | POA: Diagnosis not present

## 2018-02-13 DIAGNOSIS — R1310 Dysphagia, unspecified: Secondary | ICD-10-CM

## 2018-02-13 DIAGNOSIS — N529 Male erectile dysfunction, unspecified: Secondary | ICD-10-CM | POA: Diagnosis not present

## 2018-02-13 DIAGNOSIS — G4700 Insomnia, unspecified: Secondary | ICD-10-CM | POA: Insufficient documentation

## 2018-02-13 MED ORDER — SILDENAFIL CITRATE 20 MG PO TABS: 20.0000 mg | ORAL_TABLET | Freq: Every day | ORAL | 11 refills | Status: DC | PRN

## 2018-02-13 MED ORDER — VARDENAFIL HCL 10 MG PO TABS
10.0000 mg | ORAL_TABLET | Freq: Every day | ORAL | 11 refills | Status: DC | PRN
Start: 2018-02-13 — End: 2020-04-18

## 2018-02-13 NOTE — Assessment & Plan Note (Addendum)
Previously levitra worked and rx for that. He would like cost effective solution as well so rx for sildenafil to see if this is effective as well. Knows to seek care for erection lasting longer than 4-6 hours and we talked about how there are no long term consequences to not masturbating that any unused sperm is just broken down in the body.

## 2018-02-13 NOTE — Assessment & Plan Note (Signed)
We talked about melatonin and its safety and why it has efficacy. Okay to double up for flight but we talked about how this may not work any better than single dose.

## 2018-02-13 NOTE — Progress Notes (Signed)
   Subjective:    Patient ID: Jose Holden, male    DOB: 10-Jan-1956, 62 y.o.   MRN: [id number]  HPI The patient is a new 62 YO man coming in for several concerns including erectile dysfunction (not a consistent issue but has had some problems in the past, used levitra and cialis in the past and levitra worked better, has a girlfriend in [place] and wants to connect with her possibly when he goes to visit her soon), and sleep disturbance (takes melatonin for sleep and wants to know if he can double up for plane ride to see if this can help him get some sleep), and dysphagia (prior dilation, did not last that long, with good chewing and avoiding certain textures he can get by fine, would like to go see GI as he has almost met deductible for the year and try to get another dilation as it did help significantly, does not recall if he was told he had GERD changes then, does not take any GERD medication or have symptoms of GERD currently).  Plays cello [employer] and swims daily.   PMH, [facility], social history reviewed and updated.   Review of Systems  Constitutional: Negative.   HENT: Negative.   Eyes: Negative.   Respiratory: Negative for cough, chest tightness and shortness of breath.   Cardiovascular: Negative for chest pain, palpitations and leg swelling.  Gastrointestinal: Negative for abdominal distention, abdominal pain, constipation, diarrhea, nausea and vomiting.  Genitourinary:       ED  Musculoskeletal: Negative.   Skin: Negative.   Neurological: Negative.   Psychiatric/Behavioral: Positive for sleep disturbance.      Objective:   Physical Exam  Constitutional: He is oriented to person, place, and time. He appears well-developed and well-nourished.  Thin tall  HENT:  Head: Normocephalic and atraumatic.  Eyes: EOM are normal.  Neck: Normal range of motion.  Cardiovascular: Normal rate.  irreg at times  Pulmonary/Chest: Effort normal and breath sounds normal. No respiratory  distress. He has no wheezes. He has no rales.  Abdominal: Soft. Bowel sounds are normal. He exhibits no distension. There is no tenderness. There is no rebound.  Musculoskeletal: He exhibits no edema.  Neurological: He is alert and oriented to person, place, and time. Coordination normal.  Skin: Skin is warm and dry.  Psychiatric: He has a normal mood and affect.   Vitals:   02/13/18 1301  BP: 98/60  Pulse: 79  Temp: 97.7 F (36.5 C)  TempSrc: Oral  SpO2: 95%  Weight: 198 lb (89.8 kg)  Height: '6\' 8"'$  (2.032 m)   Shingrix IM given at visit    Assessment & Plan:  Visit time 60 minutes: greater than 50% of that time was spent in face to face counseling and coordination of care with the patient: counseled about erectile dysfunction, masturbation and safety of not masturbating, the current state of prostate screening tests and exams as well as history of prostate cancer and PSA screening and also about history of dysphagia and prior dilation as well as several other issues.

## 2018-02-13 NOTE — Patient Instructions (Signed)
We have given you a prescription for levitra as well as the generic viagra (sildenafil). For the sildenafil you can take 1-5 pills at a time as needed. You can try this to see if it works or how many pills you need.   We will get you in with GI upstairs to meet them and get another dilation.   You have been given the shingles vaccine today and need another in 2 months.

## 2018-02-13 NOTE — Assessment & Plan Note (Signed)
Unclear the etiology of this. Getting records. Taking aspirin 325 mg daily. Not on beta blocker. Rate is irregular on exam today. Physically fit currently. CHADsvasc is low.

## 2018-02-13 NOTE — Assessment & Plan Note (Signed)
Referral to GI as he would like EGD for possible dilation. We are signing to get records but per care everywhere it would appear he had this done 2017. He cannot recall year of last colonoscopy but no polyps and he thinks 2010 or 2011 in [place] when living there prior to [facility]. Will try to get records from past PCP to see if they have record of colonoscopy.

## 2018-02-17 ENCOUNTER — Telehealth: Payer: Self-pay | Admitting: Internal Medicine

## 2018-02-17 NOTE — Telephone Encounter (Signed)
Pt is calling back and was given GI depart phone number. Pt is aware that dept will call him

## 2018-02-17 NOTE — Telephone Encounter (Signed)
Copied from Jose Holden [id number]. Topic: Referral - Question >> Feb 17, 2018 12:22 PM [name] wrote: Reason for CRM: Pt would like to know if Dr. Sharlet Salina had any recommendations for a podiatrist in [place]. Pt is requesting a call back to discuss possible referral. Please advise.  [phone]

## 2018-02-17 NOTE — Telephone Encounter (Signed)
Do you have any recommendations of a podiatrist

## 2018-02-17 NOTE — Telephone Encounter (Signed)
Copied from [place] [id number]. Topic: Referral - Status >> Feb 17, 2018 12:20 PM [name] wrote: Reason for CRM: Pt called to check status of gastroenterology referral. Pt would like a call back at [phone].

## 2018-02-17 NOTE — Telephone Encounter (Signed)
I don't have a specific podiatrist; I have used the [facility] many times with good success; he can look at their website/ providers and see if he feels anyone is a good fit for him.

## 2018-02-17 NOTE — Telephone Encounter (Signed)
Patient informed of providers response and stated understanding

## 2018-02-17 NOTE — Telephone Encounter (Signed)
I did not see a referral in regard.  I left patient a vm to let him know he can call GI himself to set routine appointments up but if he was referred for anything else to give [place] a call back.

## 2018-02-17 NOTE — Telephone Encounter (Signed)
There is a referral placed to Gi for patient already it will take time before patient will get a call to schedule this appointment. The referral was placed to [facility] if he would like to call them

## 2018-02-27 ENCOUNTER — Encounter: Payer: Self-pay | Admitting: Gastroenterology

## 2018-02-27 ENCOUNTER — Ambulatory Visit: Payer: BLUE CROSS/BLUE SHIELD | Admitting: Gastroenterology

## 2018-02-27 VITALS — BP 104/60 | HR 66 | Ht 79.0 in | Wt 196.0 lb

## 2018-02-27 DIAGNOSIS — R1310 Dysphagia, unspecified: Secondary | ICD-10-CM

## 2018-02-27 DIAGNOSIS — K222 Esophageal obstruction: Secondary | ICD-10-CM

## 2018-02-27 NOTE — Progress Notes (Signed)
Very reasonable.  Agree with assessment and plan.

## 2018-02-27 NOTE — Patient Instructions (Signed)
If you are age 60 or older, your body mass index should be between 23-30. Your Body mass index is 22.08 kg/m. If this is out of the aforementioned range listed, please consider follow up with your Primary Care Provider.  If you are age 50 or younger, your body mass index should be between 19-25. Your Body mass index is 22.08 kg/m. If this is out of the aformentioned range listed, please consider follow up with your Primary Care Provider.   You have been scheduled for a Barium Esophogram at [facility] on 03/10/18  at 9 am. Please arrive 15 minutes prior to your appointment for registration. Make certain not to have anything to eat or drink 3 hours prior to your test. If you need to reschedule for any reason, please contact radiology at [phone] to do so. __________________________________________________________________ A barium swallow is an examination that concentrates on views of the esophagus. This tends to be a double contrast exam (barium and two liquids which, when combined, create a gas to distend the wall of the oesophagus) or single contrast (non-ionic iodine based). The study is usually tailored to your symptoms so a good history is essential. Attention is paid during the study to the form, structure and configuration of the esophagus, looking for functional disorders (such as aspiration, dysphagia, achalasia, motility and reflux) EXAMINATION You may be asked to change into a gown, depending on the type of swallow being performed. A radiologist and radiographer will perform the procedure. The radiologist will advise you of the type of contrast selected for your procedure and direct you during the exam. You will be asked to stand, sit or lie in several different positions and to hold a small amount of fluid in your mouth before being asked to swallow while the imaging is performed .In some instances you may be asked to swallow barium coated marshmallows to assess the motility of a  solid food bolus. The exam can be recorded as a digital or video fluoroscopy procedure. POST PROCEDURE It will take 1-2 days for the barium to pass through your system. To facilitate this, it is important, unless otherwise directed, to increase your fluids for the next 24-48hrs and to resume your normal diet.  This test typically takes about 30 minutes to perform. _____________________________________________________________________________ We will call you with results.  Thank you for choosing me and [facility].   Alonza Bogus, PA-C

## 2018-02-27 NOTE — Progress Notes (Signed)
02/27/2018 Jose Holden [id number] December 14, 1955   HISTORY OF PRESENT ILLNESS: This is a 62 year old male who is new to our office.  He presents here today with complaints of solid food dysphasia.  Was referred here by Dr. Sharlet Salina.  He's had this issue in the past.  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  Gastric biopsies showed mild reactive changes, negative for Hpylori.  This was dilated and he says that it did 100% help his symptoms, but the relief only lasted about a month before symptoms returned again as they had previously.  He says that this dysphasia is not severe.  It occurs almost with all solid food, but is quite mild and just slightly bothersome.  He says that he admits that he is pretty fast eater and probably does not chew his food as well as he should, etc.  He is not on any acid reduction medication as he absolutely denies any heartburn or reflux issues.   Past Medical History:  Diagnosis Date  . A-fib (HCC)    On Aspirin only  . Anxiety   . Cataract   . ED (erectile dysfunction)   . H/O detached retina repair   . Heart murmur    Past Surgical History:  Procedure Laterality Date  . EYE SURGERY     detached retina surgery x2  . LAPAROSCOPIC APPENDECTOMY N/A 08/29/2015   Procedure: APPENDECTOMY LAPAROSCOPIC;  Surgeon: Johnathan Hausen, MD;  Location: [facility];  Service: General;  Laterality: N/A;    reports that he has never smoked. He has never used smokeless tobacco. He reports that he drinks alcohol. He reports that he does not use drugs. family history includes Cancer in his father and mother. No Known Allergies    Outpatient Encounter Medications as of 02/27/2018  Medication Sig  . aspirin 325 MG tablet Take 325 mg by mouth daily.  Marland Kitchen buPROPion (WELLBUTRIN XL) 150 MG 24 hr tablet Take 150 mg by mouth daily.  . propranolol (INDERAL) 20 MG  tablet Take 20 mg by mouth as needed (takes as needed before performing with his cello).  . sildenafil (REVATIO) 20 MG tablet Take 1-5 tablets (20-100 mg total) by mouth daily as needed (erection).  . vardenafil (LEVITRA) 10 MG tablet Take 1 tablet (10 mg total) by mouth daily as needed for erectile dysfunction.   No facility-administered encounter medications on file as of 02/27/2018.      REVIEW OF SYSTEMS  : All other systems reviewed and negative except where noted in the History of Present Illness.   PHYSICAL EXAM: There were no vitals taken for this visit. General: Well developed white male in no acute distress Head: Normocephalic and atraumatic Eyes:  Sclerae anicteric, conjunctiva pink. Ears: Normal auditory acuity Lungs: Clear throughout to auscultation; no increased WOB. Heart: Regular rate and rhythm; no M/R/G. Abdomen: Soft, non-distended.  BS present.  Non-tender. Musculoskeletal: Symmetrical with no gross deformities  Skin: No lesions on visible extremities Extremities: No edema  Neurological: Alert oriented x 4, grossly non-focal Psychological:  Alert and cooperative. Normal mood and affect  ASSESSMENT AND PLAN: *62 year old male with complaints of solid food dysphagia.  Had benign appearing esophageal stenosis on EGD in 04/2016 that was dilated.  He did get a lot of relief from that but it only lasted a month before symptoms recurred again.  Question if he has some type  of dysmotility in addition to the stenosis that was found.  We will plan for esophagram to further evaluate for recurrent stenosis versus dysmotility, etc.   CC:  Hoyt Koch, *

## 2018-03-10 ENCOUNTER — Ambulatory Visit (HOSPITAL_COMMUNITY): Admission: RE | Admit: 2018-03-10 | Payer: BLUE CROSS/BLUE SHIELD | Source: Ambulatory Visit

## 2018-03-13 ENCOUNTER — Ambulatory Visit (HOSPITAL_COMMUNITY)
Admission: RE | Admit: 2018-03-13 | Discharge: 2018-03-13 | Disposition: A | Discharge status: Home / Self Care | Payer: BLUE CROSS/BLUE SHIELD | Source: Ambulatory Visit | Attending: Gastroenterology | Admitting: Gastroenterology

## 2018-03-13 DIAGNOSIS — R1310 Dysphagia, unspecified: Secondary | ICD-10-CM | POA: Diagnosis not present

## 2018-03-13 DIAGNOSIS — K222 Esophageal obstruction: Secondary | ICD-10-CM

## 2018-04-10 ENCOUNTER — Encounter: Payer: Self-pay | Admitting: Internal Medicine

## 2018-04-14 ENCOUNTER — Encounter: Payer: Self-pay | Admitting: Internal Medicine

## 2018-04-14 ENCOUNTER — Ambulatory Visit: Payer: BLUE CROSS/BLUE SHIELD | Admitting: Internal Medicine

## 2018-04-14 DIAGNOSIS — J069 Acute upper respiratory infection, unspecified: Secondary | ICD-10-CM

## 2018-04-14 NOTE — Progress Notes (Signed)
   Subjective:    Patient ID: Jose Holden, male    DOB: November 21, 1955, 62 y.o.   MRN: [id number]  HPI The patient is a 62 YO man coming in for concerns about respiratory cold. Started about 1 week ago and overall improving. Started with drainage and sore throat with cough. This has improved some. Now no sore throat. Cough is mild and non-productive. Originally green sputum. Denies fevers or chills. Denies SOB. Denies headaches or migraines. Is having some nasal drainage still. Has taking some mucinex and otc cold medicine and that helps. He is flying to [place] later this week and wanted to make sure his lungs were clear and he was safe to travel.   Review of Systems  Constitutional: Negative for activity change, appetite change, chills, fatigue, fever and unexpected weight change.  HENT: Positive for congestion, postnasal drip, rhinorrhea and sinus pressure. Negative for ear discharge, ear pain, sinus pain, sneezing, sore throat, tinnitus, trouble swallowing and voice change.   Eyes: Negative.   Respiratory: Positive for cough. Negative for chest tightness, shortness of breath and wheezing.   Cardiovascular: Negative.   Gastrointestinal: Negative.   Neurological: Negative.       Objective:   Physical Exam  Constitutional: He is oriented to person, place, and time. He appears well-developed and well-nourished.  HENT:  Head: Normocephalic and atraumatic.  Oropharynx with redness and clear drainage, nose with swollen turbinates, TMs normal bilaterally  Eyes: EOM are normal.  Neck: Normal range of motion. No thyromegaly present.  Cardiovascular: Normal rate and regular rhythm.  Pulmonary/Chest: Effort normal and breath sounds normal. No respiratory distress. He has no wheezes. He has no rales.  Abdominal: Soft.  Lymphadenopathy:    He has no cervical adenopathy.  Neurological: He is alert and oriented to person, place, and time.  Skin: Skin is warm and dry.   Vitals:   04/14/18 1402    BP: 110/60  Pulse: 85  Temp: 98.4 F (36.9 C)  TempSrc: Oral  SpO2: 97%  Weight: 199 lb (90.3 kg)  Height: 6\' 7"  (2.007 m)      Assessment & Plan:

## 2018-04-15 ENCOUNTER — Ambulatory Visit: Payer: BLUE CROSS/BLUE SHIELD

## 2018-04-15 DIAGNOSIS — J069 Acute upper respiratory infection, unspecified: Secondary | ICD-10-CM | POA: Insufficient documentation

## 2018-04-15 NOTE — Assessment & Plan Note (Signed)
Given the improvement he does not need intervention today, reassurance given. Advised to start zyrtec for drainage. Can stop mucinex as lungs clear.

## 2018-04-17 ENCOUNTER — Encounter: Payer: Self-pay | Admitting: Family

## 2018-04-17 ENCOUNTER — Ambulatory Visit: Payer: BLUE CROSS/BLUE SHIELD | Admitting: Family

## 2018-04-17 VITALS — BP 108/62 | HR 90 | Temp 98.3°F | Ht 79.0 in

## 2018-04-17 DIAGNOSIS — J069 Acute upper respiratory infection, unspecified: Secondary | ICD-10-CM

## 2018-04-17 DIAGNOSIS — B9789 Other viral agents as the cause of diseases classified elsewhere: Secondary | ICD-10-CM | POA: Diagnosis not present

## 2018-04-17 MED ORDER — AZITHROMYCIN 250 MG PO TABS: ORAL_TABLET | ORAL | 0 refills | Status: DC

## 2018-04-17 NOTE — Progress Notes (Signed)
Jose Holden is a 62 y.o. male with the following history as recorded in EpicCare:  Patient Active Problem List   Diagnosis Date Noted  . Viral URI 04/15/2018  . Esophageal stenosis 02/27/2018  . Dysphagia 02/13/2018  . Erectile dysfunction 02/13/2018  . Insomnia 02/13/2018  . Atrial fibrillation, chronic 02/13/2018    Current Outpatient Medications  Medication Sig Dispense Refill  . aspirin 325 MG tablet Take 325 mg by mouth daily.    Marland Kitchen buPROPion (WELLBUTRIN XL) 150 MG 24 hr tablet Take 150 mg by mouth daily.    . propranolol (INDERAL) 20 MG tablet Take 20 mg by mouth as needed (takes as needed before performing with his cello).    . sildenafil (REVATIO) 20 MG tablet Take 1-5 tablets (20-100 mg total) by mouth daily as needed (erection). 100 tablet 11  . vardenafil (LEVITRA) 10 MG tablet Take 1 tablet (10 mg total) by mouth daily as needed for erectile dysfunction. 30 tablet 11  . azithromycin (ZITHROMAX) 250 MG tablet 2 tabs po qd x 1 day; 1 tablet per day x 4 days; 6 tablet 0   No current facility-administered medications for this visit.     Allergies: Patient has no known allergies.  Past Medical History:  Diagnosis Date  . A-fib (HCC)    On Aspirin only  . Anxiety   . Cataract   . ED (erectile dysfunction)   . Heart murmur     Past Surgical History:  Procedure Laterality Date  . EYE SURGERY     detached retina surgery x2  . LAPAROSCOPIC APPENDECTOMY N/A 08/29/2015   Procedure: APPENDECTOMY LAPAROSCOPIC;  Surgeon: Johnathan Hausen, MD;  Location: [facility];  Service: General;  Laterality: N/A;    Family History  Problem Relation Age of Onset  . Cancer Mother        Pancreas CA  . Cancer Father        Brain CA  . Colon cancer Neg Hx   . Esophageal cancer Neg Hx   . Rectal cancer Neg Hx     Social History   Tobacco Use  . Smoking status: Never Smoker  . Smokeless tobacco: Never Used  Substance Use Topics  . Alcohol use: Yes    Alcohol/week: 0.0 standard drinks     Comment: mod    Subjective:  Patient presents with concerns for persisting cough/ congestion; seen earlier this week- thought to be viral but patient is concerned because he is flying to [place] on Sunday; using OTC Mucinex with some benefit; notes he has actually started to feel better today;     Objective:  Vitals:   04/17/18 1602  BP: 108/62  Pulse: 90  Temp: 98.3 F (36.8 C)  TempSrc: Oral  SpO2: 96%  Height: 6\' 7"  (2.007 m)    General: Well developed, well nourished, in no acute distress  Skin : Warm and dry.  Head: Normocephalic and atraumatic  Eyes: Sclera and conjunctiva clear; pupils round and reactive to light; extraocular movements intact  Ears: External normal; canals clear; tympanic membranes normal  Oropharynx: Pink, supple. No suspicious lesions  Neck: Supple without thyromegaly, adenopathy  Lungs: Respirations unlabored; clear to auscultation bilaterally without wheeze, rales, rhonchi  CVS exam: normal rate and regular rhythm.  Neurologic: Alert and oriented; speech intact; face symmetrical; moves all extremities well; CNII-XII intact without focal deficit   Assessment:  1. Viral URI with cough     Plan:  Rx for Z-pak given to patient- hold and  fill if symptoms worsening in the next 24-48 hours; continue Robitussin and Mucinex; increase fluids, rest and follow up worse, no better.   No follow-ups on file.  No orders of the defined types were placed in this encounter.   Requested Prescriptions   Signed Prescriptions Disp Refills  . azithromycin (ZITHROMAX) 250 MG tablet 6 tablet 0    Sig: 2 tabs po qd x 1 day; 1 tablet per day x 4 days;

## 2018-04-18 ENCOUNTER — Ambulatory Visit: Payer: BLUE CROSS/BLUE SHIELD | Admitting: Internal Medicine

## 2018-04-22 ENCOUNTER — Encounter: Payer: Self-pay | Admitting: Internal Medicine

## 2018-04-22 ENCOUNTER — Encounter: Payer: Self-pay | Admitting: Family

## 2018-04-30 ENCOUNTER — Ambulatory Visit (INDEPENDENT_AMBULATORY_CARE_PROVIDER_SITE_OTHER): Payer: BLUE CROSS/BLUE SHIELD | Admitting: *Deleted

## 2018-04-30 DIAGNOSIS — Z23 Encounter for immunization: Secondary | ICD-10-CM

## 2018-05-22 ENCOUNTER — Encounter: Payer: Self-pay | Admitting: Internal Medicine

## 2018-05-22 ENCOUNTER — Ambulatory Visit: Payer: BLUE CROSS/BLUE SHIELD | Admitting: Internal Medicine

## 2018-05-22 DIAGNOSIS — F419 Anxiety disorder, unspecified: Secondary | ICD-10-CM | POA: Diagnosis not present

## 2018-05-22 DIAGNOSIS — N529 Male erectile dysfunction, unspecified: Secondary | ICD-10-CM

## 2018-05-22 DIAGNOSIS — R21 Rash and other nonspecific skin eruption: Secondary | ICD-10-CM

## 2018-05-22 MED ORDER — BETAMETHASONE VALERATE 0.1 % EX OINT: 1.0000 "application " | TOPICAL_OINTMENT | Freq: Two times a day (BID) | CUTANEOUS | 0 refills | Status: DC

## 2018-05-22 MED ORDER — DULOXETINE HCL 30 MG PO CPEP: 30.0000 mg | ORAL_CAPSULE | Freq: Every day | ORAL | 3 refills | Status: DC

## 2018-05-22 NOTE — Patient Instructions (Signed)
We have sent in the cream to use twice a day for the rash.   We have sent in cymbalta (duloxetine) to try if needed.

## 2018-05-22 NOTE — Progress Notes (Signed)
   Subjective:   Patient ID: Jose Holden, male    DOB: 07-01-1955, 62 y.o.   MRN: [id number]  HPI The patient is a 62 YO man coming in for several concerns including delayed ejaculation (happening lately and he is in a relationship with someone in [place] and going to visit, is concerned about this, wonders if this is from his wellbutrin, has not noticed it as much in the past as he was not in a committed relationship) and anxiety (stopped taking wellbutrin due to concerns about side effects, is not sure if he wants to take something else, has tried lexapro and celexa and they did not work for him, denies SI/HI), and rash (has had before with soap, is not sure if this is cause again, has started about 3 new over the counter supplements, has many questions about this).   Review of Systems  Constitutional: Negative.   HENT: Negative.   Eyes: Negative.   Respiratory: Negative for cough, chest tightness and shortness of breath.   Cardiovascular: Negative for chest pain, palpitations and leg swelling.  Gastrointestinal: Negative for abdominal distention, abdominal pain, constipation, diarrhea, nausea and vomiting.  Musculoskeletal: Negative.   Skin: Positive for rash.  Neurological: Negative.   Psychiatric/Behavioral: Negative.     Objective:  Physical Exam Constitutional:      Appearance: He is well-developed.  HENT:     Head: Normocephalic and atraumatic.  Neck:     Musculoskeletal: Normal range of motion.  Cardiovascular:     Rate and Rhythm: Normal rate and regular rhythm.  Pulmonary:     Effort: Pulmonary effort is normal. No respiratory distress.     Breath sounds: Normal breath sounds. No wheezing or rales.  Abdominal:     General: Bowel sounds are normal. There is no distension.     Palpations: Abdomen is soft.     Tenderness: There is no abdominal tenderness. There is no rebound.  Skin:    General: Skin is warm and dry.     Comments: Eczema appearing rash on the upper  left arm and right neck, without cellulitis or infection surrounding or abscess.   Neurological:     Mental Status: He is alert and oriented to person, place, and time.     Coordination: Coordination normal.     Vitals:   05/22/18 1400  BP: 110/60  Pulse: 72  Temp: 97.7 F (36.5 C)  TempSrc: Oral  SpO2: 96%  Weight: 200 lb (90.7 kg)  Height: 6\' 7"  (2.007 m)    Assessment & Plan:  Visit time 40  minutes: greater than 50% of that time was spent in face to face counseling and coordination of care with the patient: counseled about side effects of different anti-anxiety medications, likely timeline he would notice if getting anxious, timeline for when new medicine would start working, rash, etiology, soap varieties, otc supplements and fillers and regulation regarding quality assurances

## 2018-05-23 DIAGNOSIS — R21 Rash and other nonspecific skin eruption: Secondary | ICD-10-CM | POA: Insufficient documentation

## 2018-05-23 DIAGNOSIS — F419 Anxiety disorder, unspecified: Secondary | ICD-10-CM | POA: Insufficient documentation

## 2018-05-23 NOTE — Assessment & Plan Note (Signed)
Takes propranolol and wellbutrin. Stopped wellbutrin. After extensive discussion with the patient about various options and timelines he elects to get rx for cymbalta to try if needed but will wait and see if he gets symptoms off medication. We talked about natural anxiety reducing techniques including exercise, meditation, deep breathing.

## 2018-05-23 NOTE — Assessment & Plan Note (Signed)
Rx for betamethasone cream for the rash and advised on low chemical, non-fragrance and non-color.

## 2018-05-23 NOTE — Assessment & Plan Note (Signed)
We discussed at length how the wellbutrin could be causing the side effect and how within 2 weeks of stopping if the cause he should see changes. Does have sildenafil if needed for ED as well.

## 2019-07-28 ENCOUNTER — Other Ambulatory Visit: Payer: Self-pay

## 2019-07-28 ENCOUNTER — Ambulatory Visit (INDEPENDENT_AMBULATORY_CARE_PROVIDER_SITE_OTHER): Payer: 59 | Admitting: Internal Medicine

## 2019-07-28 ENCOUNTER — Encounter: Payer: Self-pay | Admitting: Internal Medicine

## 2019-07-28 VITALS — BP 126/78 | HR 80 | Temp 98.1°F | Ht 79.0 in | Wt 201.0 lb

## 2019-07-28 DIAGNOSIS — Z1211 Encounter for screening for malignant neoplasm of colon: Secondary | ICD-10-CM

## 2019-07-28 DIAGNOSIS — I482 Chronic atrial fibrillation, unspecified: Secondary | ICD-10-CM | POA: Diagnosis not present

## 2019-07-28 DIAGNOSIS — F419 Anxiety disorder, unspecified: Secondary | ICD-10-CM | POA: Diagnosis not present

## 2019-07-28 DIAGNOSIS — Z0000 Encounter for general adult medical examination without abnormal findings: Secondary | ICD-10-CM | POA: Diagnosis not present

## 2019-07-28 LAB — COMPREHENSIVE METABOLIC PANEL
ALT: 23 U/L (ref 0–53)
AST: 26 U/L (ref 0–37)
Albumin: 4.1 g/dL (ref 3.5–5.2)
Alkaline Phosphatase: 53 U/L (ref 39–117)
BUN: 22 mg/dL (ref 6–23)
CO2: 30 mEq/L (ref 19–32)
Calcium: 9.7 mg/dL (ref 8.4–10.5)
Chloride: 104 mEq/L (ref 96–112)
Creatinine, Ser: 1.05 mg/dL (ref 0.40–1.50)
GFR: 71.54 mL/min (ref >60.00)
Glucose, Bld: 104 mg/dL — ABNORMAL HIGH (ref 70–99)
Potassium: 4.7 mEq/L (ref 3.5–5.1)
Sodium: 140 mEq/L (ref 135–145)
Total Bilirubin: 0.6 mg/dL (ref 0.2–1.2)
Total Protein: 6.9 g/dL (ref 6.0–8.3)

## 2019-07-28 LAB — LIPID PANEL
Cholesterol: 149 mg/dL (ref 0–200)
HDL: 34.7 mg/dL — ABNORMAL LOW (ref >39.00)
LDL Cholesterol: 100 mg/dL — ABNORMAL HIGH (ref 0–99)
NonHDL: 114.65
Total CHOL/HDL Ratio: 4
Triglycerides: 75 mg/dL (ref 0.0–149.0)
VLDL: 15 mg/dL (ref 0.0–40.0)

## 2019-07-28 LAB — CBC
HCT: 46.1 % (ref 39.0–52.0)
Hemoglobin: 15.7 g/dL (ref 13.0–17.0)
MCHC: 34.1 g/dL (ref 30.0–36.0)
MCV: 93.2 fl (ref 78.0–100.0)
Platelets: 230 10*3/uL (ref 150.0–400.0)
RBC: 4.95 Mil/uL (ref 4.22–5.81)
RDW: 13.5 % (ref 11.5–15.5)
WBC: 5.5 10*3/uL (ref 4.0–10.5)

## 2019-07-28 NOTE — Progress Notes (Signed)
   Subjective:   Patient ID: Jose Holden, male    DOB: [birth year], 64 y.o.   MRN: [id number]  HPI The patient is a 64 YO man coming in for physical. No major health changes since last visit. Needs colonoscopy willing to do that.   PMH, [facility], social history reviewed and updated  Review of Systems  Constitutional: Negative.   HENT: Negative.   Eyes: Negative.   Respiratory: Negative for cough, chest tightness and shortness of breath.   Cardiovascular: Negative for chest pain, palpitations and leg swelling.  Gastrointestinal: Negative for abdominal distention, abdominal pain, constipation, diarrhea, nausea and vomiting.  Musculoskeletal: Negative.   Skin: Negative.   Neurological: Negative.   Psychiatric/Behavioral: Negative.     Objective:  Physical Exam Constitutional:      Appearance: He is well-developed.  HENT:     Head: Normocephalic and atraumatic.  Cardiovascular:     Rate and Rhythm: Normal rate and regular rhythm.  Pulmonary:     Effort: Pulmonary effort is normal. No respiratory distress.     Breath sounds: Normal breath sounds. No wheezing or rales.  Abdominal:     General: Bowel sounds are normal. There is no distension.     Palpations: Abdomen is soft.     Tenderness: There is no abdominal tenderness. There is no rebound.  Musculoskeletal:     Cervical back: Normal range of motion.  Skin:    General: Skin is warm and dry.  Neurological:     Mental Status: He is alert and oriented to person, place, and time.     Coordination: Coordination normal.     Vitals:   07/28/19 1043  BP: 126/78  Pulse: 80  Temp: 98.1 F (36.7 C)  TempSrc: Oral  SpO2: 97%  Weight: 201 lb (91.2 kg)  Height: 6\' 7"  (2.007 m)    This visit occurred during the SARS-CoV-2 public health emergency.  Safety protocols were in place, including screening questions prior to the visit, additional usage of staff PPE, and extensive cleaning of exam room while observing appropriate  contact time as indicated for disinfecting solutions.   Assessment & Plan:

## 2019-07-28 NOTE — Assessment & Plan Note (Signed)
Stable on cymbalta.  

## 2019-07-28 NOTE — Patient Instructions (Addendum)
COVID-19 Vaccine Information can be found at: [email or web address].uk For questions related to vaccine distribution or appointments, please email [email or web address] or call [phone].    Health Maintenance, Male Adopting a healthy lifestyle and getting preventive care are important in promoting health and wellness. Ask your health care provider about:  The right schedule for you to have regular tests and exams.  Things you can do on your own to prevent diseases and keep yourself healthy. What should I know about diet, weight, and exercise? Eat a healthy diet   Eat a diet that includes plenty of vegetables, fruits, low-fat dairy products, and lean protein.  Do not eat a lot of foods that are high in solid fats, added sugars, or sodium. Maintain a healthy weight Body mass index (BMI) is a measurement that can be used to identify possible weight problems. It estimates body fat based on height and weight. Your health care provider can help determine your BMI and help you achieve or maintain a healthy weight. Get regular exercise Get regular exercise. This is one of the most important things you can do for your health. Most adults should:  Exercise for at least 150 minutes each week. The exercise should increase your heart rate and make you sweat (moderate-intensity exercise).  Do strengthening exercises at least twice a week. This is in addition to the moderate-intensity exercise.  Spend less time sitting. Even light physical activity can be beneficial. Watch cholesterol and blood lipids Have your blood tested for lipids and cholesterol at 62 years of age, then have this test every 5 years. You may need to have your cholesterol levels checked more often if:  Your lipid or cholesterol levels are high.  You are older than 62 years of age.  You are at high risk for heart disease. What should I know about cancer screening? Many  types of cancers can be detected early and may often be prevented. Depending on your health history and family history, you may need to have cancer screening at various ages. This may include screening for:  Colorectal cancer.  Prostate cancer.  Skin cancer.  Lung cancer. What should I know about heart disease, diabetes, and high blood pressure? Blood pressure and heart disease  High blood pressure causes heart disease and increases the risk of stroke. This is more likely to develop in people who have high blood pressure readings, are of African descent, or are overweight.  Talk with your health care provider about your target blood pressure readings.  Have your blood pressure checked: ? Every 3-5 years if you are 27-90 years of age. ? Every year if you are 62 years old or older.  If you are between the ages of 21 and 93 and are a current or former smoker, ask your health care provider if you should have a one-time screening for abdominal aortic aneurysm (AAA). Diabetes Have regular diabetes screenings. This checks your fasting blood sugar level. Have the screening done:  Once every three years after age 32 if you are at a normal weight and have a low risk for diabetes.  More often and at a younger age if you are overweight or have a high risk for diabetes. What should I know about preventing infection? Hepatitis B If you have a higher risk for hepatitis B, you should be screened for this virus. Talk with your health care provider to find out if you are at risk for hepatitis B infection. Hepatitis C Blood testing is recommended for:  Everyone  born from [birth year] through 1963.  Anyone with known risk factors for hepatitis C. Sexually transmitted infections (STIs)  You should be screened each year for STIs, including gonorrhea and chlamydia, if: ? You are sexually active and are younger than 62 years of age. ? You are older than 62 years of age and your health care provider tells you  that you are at risk for this type of infection. ? Your sexual activity has changed since you were last screened, and you are at increased risk for chlamydia or gonorrhea. Ask your health care provider if you are at risk.  Ask your health care provider about whether you are at high risk for HIV. Your health care provider may recommend a prescription medicine to help prevent HIV infection. If you choose to take medicine to prevent HIV, you should first get tested for HIV. You should then be tested every 3 months for as long as you are taking the medicine. Follow these instructions at home: Lifestyle  Do not use any products that contain nicotine or tobacco, such as cigarettes, e-cigarettes, and chewing tobacco. If you need help quitting, ask your health care provider.  Do not use street drugs.  Do not share needles.  Ask your health care provider for help if you need support or information about quitting drugs. Alcohol use  Do not drink alcohol if your health care provider tells you not to drink.  If you drink alcohol: ? Limit how much you have to 0-2 drinks a day. ? Be aware of how much alcohol is in your drink. In the [place] one drink equals one 12 oz bottle of beer (355 mL), one 5 oz glass of wine (148 mL), or one 1 oz glass of hard liquor (44 mL). General instructions  Schedule regular health, dental, and eye exams.  Stay current with your vaccines.  Tell your health care provider if: ? You often feel depressed. ? You have ever been abused or do not feel safe at home. Summary  Adopting a healthy lifestyle and getting preventive care are important in promoting health and wellness.  Follow your health care provider's instructions about healthy diet, exercising, and getting tested or screened for diseases.  Follow your health care provider's instructions on monitoring your cholesterol and blood pressure. This information is not intended to replace advice given to you by your  health care provider. Make sure you discuss any questions you have with your health care provider. Document Revised: 05/07/2018 Document Reviewed: 05/07/2018 Elsevier Patient Education  2020 [employer].

## 2019-07-28 NOTE — Addendum Note (Signed)
Addended by: Isaiah Serge D on: 07/28/2019 01:02 PM   Modules accepted: Orders

## 2019-07-28 NOTE — Assessment & Plan Note (Signed)
Flu shot deferred today. Tetanus he thinks is up to date. Colonoscopy referral to GI done. Counseled about sun safety and mole surveillance. Counseled about the dangers of distracted driving. Given 10 year screening recommendations.

## 2019-07-28 NOTE — Assessment & Plan Note (Signed)
Taking aspirin 325 mg daily. Appears normal today. Not on beta blocker.

## 2019-07-29 LAB — HIV ANTIBODY (ROUTINE TESTING W REFLEX): HIV 1&2 Ab, 4th Generation: NONREACTIVE

## 2019-07-29 LAB — HEPATITIS C ANTIBODY
Hepatitis C Ab: NONREACTIVE
SIGNAL TO CUT-OFF: 0.02 (ref <1.00)

## 2019-07-30 LAB — GC/CHLAMYDIA PROBE AMP
Chlamydia trachomatis, NAA: NEGATIVE
Neisseria Gonorrhoeae by PCR: NEGATIVE

## 2019-07-31 ENCOUNTER — Encounter: Payer: Self-pay | Admitting: Gastroenterology

## 2019-08-26 ENCOUNTER — Encounter: Payer: 59 | Admitting: Gastroenterology

## 2019-09-10 ENCOUNTER — Other Ambulatory Visit: Payer: Self-pay

## 2019-09-10 ENCOUNTER — Ambulatory Visit (AMBULATORY_SURGERY_CENTER): Payer: Self-pay | Admitting: *Deleted

## 2019-09-10 VITALS — Ht 79.0 in | Wt 190.0 lb

## 2019-09-10 DIAGNOSIS — Z1211 Encounter for screening for malignant neoplasm of colon: Secondary | ICD-10-CM

## 2019-09-10 MED ORDER — SUPREP BOWEL PREP KIT 17.5-3.13-1.6 GM/177ML PO SOLN: 1.0000 | Freq: Once | ORAL | 0 refills | Status: AC

## 2019-09-10 NOTE — Progress Notes (Signed)
09-01-2019 completed COVID vaccine series   Pt verified name, DOB, address and insurance during PV today. Pt mailed instruction packet to included paper to complete and mail back to [facility] with addressed and stamped envelope, Emmi video, copy of consent form to read and not return, and instructions.  PV completed over the phone. Pt encouraged to call with questions or issues   No egg or soy allergy known to patient  No issues with past sedation with any surgeries  or procedures, no intubation problems  No diet pills per patient No home 02 use per patient  No blood thinners per patient  Pt denies issues with constipation  No A fib or A flutter  EMMI video sent to pt's e mail   Due to the COVID-19 pandemic we are asking patients to follow these guidelines. Please only bring one care partner. Please be aware that your care partner may wait in the car in the parking lot or if they feel like they will be too hot to wait in the car, they may wait in the lobby on the 4th floor. All care partners are required to wear a mask the entire time (we do not have any that we can provide them), they need to practice social distancing, and we will do a Covid check for all patient's and care partners when you arrive. Also we will check their temperature and your temperature. If the care partner waits in their car they need to stay in the parking lot the entire time and we will call them on their cell phone when the patient is ready for discharge so they can bring the car to the front of the building. Also all patient's will need to wear a mask into building.

## 2019-09-18 ENCOUNTER — Telehealth: Payer: Self-pay | Admitting: Internal Medicine

## 2019-09-18 ENCOUNTER — Other Ambulatory Visit: Payer: Self-pay

## 2019-09-18 MED ORDER — PROPRANOLOL HCL 20 MG PO TABS: 20.0000 mg | ORAL_TABLET | ORAL | 2 refills | Status: DC | PRN

## 2019-09-18 NOTE — Telephone Encounter (Signed)
Refill sent to pharmacy.   

## 2019-09-18 NOTE — Telephone Encounter (Signed)
New message:   1.Medication Requested: propranolol (INDERAL) 20 MG tablet (5-10 tablets) 2. Pharmacy (Name, Street, [place]): [facility] ([place] [place] - [address] 3. On Med List: yes   4. Last Visit with PCP: 07/28/19  5. Next visit date with PCP: None  Pt would like a call when this medication has been sent over. Agent: Please be advised that RX refills may take up to 3 business days. We ask that you follow-up with your pharmacy.

## 2019-09-22 ENCOUNTER — Telehealth: Payer: Self-pay | Admitting: *Deleted

## 2019-09-22 ENCOUNTER — Telehealth: Payer: Self-pay | Admitting: Internal Medicine

## 2019-09-22 NOTE — Telephone Encounter (Signed)
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

## 2019-09-22 NOTE — Telephone Encounter (Signed)
New message:   Pt is calling in regards to some covid guidelines that he has some questions about. Please advise.

## 2019-09-23 NOTE — Telephone Encounter (Signed)
Called patient answered questions that he had about COVID 19

## 2019-09-24 ENCOUNTER — Other Ambulatory Visit: Payer: Self-pay | Admitting: Internal Medicine

## 2019-09-24 ENCOUNTER — Other Ambulatory Visit (INDEPENDENT_AMBULATORY_CARE_PROVIDER_SITE_OTHER): Payer: 59

## 2019-09-24 ENCOUNTER — Other Ambulatory Visit: Payer: Self-pay

## 2019-09-24 ENCOUNTER — Encounter: Payer: Self-pay | Admitting: Internal Medicine

## 2019-09-24 ENCOUNTER — Encounter: Payer: 59 | Admitting: Gastroenterology

## 2019-09-24 ENCOUNTER — Ambulatory Visit: Payer: 59 | Admitting: Internal Medicine

## 2019-09-24 VITALS — BP 142/76 | HR 78 | Temp 97.7°F | Ht 79.0 in | Wt 192.2 lb

## 2019-09-24 DIAGNOSIS — R251 Tremor, unspecified: Secondary | ICD-10-CM | POA: Insufficient documentation

## 2019-09-24 DIAGNOSIS — R7989 Other specified abnormal findings of blood chemistry: Secondary | ICD-10-CM

## 2019-09-24 DIAGNOSIS — R29818 Other symptoms and signs involving the nervous system: Secondary | ICD-10-CM | POA: Diagnosis not present

## 2019-09-24 LAB — COMPREHENSIVE METABOLIC PANEL
ALT: 18 U/L (ref 0–53)
AST: 23 U/L (ref 0–37)
Albumin: 4.5 g/dL (ref 3.5–5.2)
Alkaline Phosphatase: 52 U/L (ref 39–117)
BUN: 37 mg/dL — ABNORMAL HIGH (ref 6–23)
CO2: 30 mEq/L (ref 19–32)
Calcium: 9.8 mg/dL (ref 8.4–10.5)
Chloride: 104 mEq/L (ref 96–112)
Creatinine, Ser: 1.27 mg/dL (ref 0.40–1.50)
GFR: 57.41 mL/min — ABNORMAL LOW (ref >60.00)
Glucose, Bld: 90 mg/dL (ref 70–99)
Potassium: 4.2 mEq/L (ref 3.5–5.1)
Sodium: 141 mEq/L (ref 135–145)
Total Bilirubin: 0.7 mg/dL (ref 0.2–1.2)
Total Protein: 7 g/dL (ref 6.0–8.3)

## 2019-09-24 LAB — T4, FREE: Free T4: 0.9 ng/dL (ref 0.60–1.60)

## 2019-09-24 LAB — MAGNESIUM: Magnesium: 2 mg/dL (ref 1.5–2.5)

## 2019-09-24 LAB — TSH: TSH: 7.04 u[IU]/mL — ABNORMAL HIGH (ref 0.35–4.50)

## 2019-09-24 LAB — VITAMIN B12: Vitamin B-12: 337 pg/mL (ref 211–911)

## 2019-09-24 LAB — VITAMIN D 25 HYDROXY (VIT D DEFICIENCY, FRACTURES): VITD: 89.4 ng/mL (ref 30.00–100.00)

## 2019-09-24 NOTE — Assessment & Plan Note (Signed)
Referral to neurology and checking labs and CT head today. His propranolol is not effective for this and feels separate.

## 2019-09-24 NOTE — Patient Instructions (Signed)
We will check the blood work today and the CT scan of the head.   We will get you in with a neurologist to check the hand out thoroughly.

## 2019-09-24 NOTE — Progress Notes (Signed)
   Subjective:   Patient ID: Jose Holden, male    DOB: 28-Apr-1956, 64 y.o.   MRN: [id number]  HPI The patient is a 64 YO man coming in for concerning change to left hand. 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 He has taken propranolol 20 mg before concerts in the past but this does not help with the left thumb. Denies injury or overuse to the left arm, neck, shoulder, hand. Denies numbness in the left hand or fingers. The shaking does not impact his life outside of performances. Does not impact with eating/drinking.   Review of Systems  Constitutional: Negative.   HENT: Negative.   Eyes: Negative.   Respiratory: Negative for cough, chest tightness and shortness of breath.   Cardiovascular: Negative for chest pain, palpitations and leg swelling.  Gastrointestinal: Negative for abdominal distention, abdominal pain, constipation, diarrhea, nausea and vomiting.  Musculoskeletal: Negative.   Skin: Negative.   Neurological: Positive for tremors.  Psychiatric/Behavioral: Negative.     Objective:  Physical Exam Constitutional:      Appearance: He is well-developed.  HENT:     Head: Normocephalic and atraumatic.  Cardiovascular:     Rate and Rhythm: Normal rate and regular rhythm.  Pulmonary:     Effort: Pulmonary effort is normal. No respiratory distress.     Breath sounds: Normal breath sounds. No wheezing or rales.  Abdominal:     General: Bowel sounds are normal. There is no distension.     Palpations: Abdomen is soft.     Tenderness: There is no abdominal tenderness. There is no rebound.  Musculoskeletal:     Cervical back: Normal range of motion.     Comments: When gripping the cello there is noticeable tremor in the left thumb which is able to steady with resting on the hand, no visible tremor at rest, no numbness in the right or left hand  Skin:    General: Skin is warm and dry.  Neurological:     Mental Status: He is alert and  oriented to person, place, and time.     Coordination: Coordination normal.     Vitals:   09/24/19 1111  BP: (!) 142/76  Pulse: 78  Temp: 97.7 F (36.5 C)  SpO2: 99%  Weight: 192 lb 3.2 oz (87.2 kg)  Height: 6\' 7"  (2.007 m)    This visit occurred during the SARS-CoV-2 public health emergency.  Safety protocols were in place, including screening questions prior to the visit, additional usage of staff PPE, and extensive cleaning of exam room while observing appropriate contact time as indicated for disinfecting solutions.   Assessment & Plan:

## 2019-10-09 ENCOUNTER — Ambulatory Visit
Admission: RE | Admit: 2019-10-09 | Discharge: 2019-10-09 | Disposition: A | Discharge status: Home / Self Care | Payer: 59 | Source: Ambulatory Visit | Attending: Internal Medicine | Admitting: Internal Medicine

## 2019-10-09 DIAGNOSIS — R29818 Other symptoms and signs involving the nervous system: Secondary | ICD-10-CM

## 2019-10-12 ENCOUNTER — Telehealth: Payer: Self-pay | Admitting: Gastroenterology

## 2019-10-12 NOTE — Telephone Encounter (Signed)
Late [facility] cancellation fee for a single procedure is $100. Please charge.

## 2019-10-13 ENCOUNTER — Encounter: Payer: 59 | Admitting: Gastroenterology

## 2019-10-13 ENCOUNTER — Telehealth: Payer: Self-pay | Admitting: Gastroenterology

## 2019-10-13 ENCOUNTER — Other Ambulatory Visit: Payer: 59 | Admitting: Gastroenterology

## 2019-10-27 ENCOUNTER — Encounter: Payer: Self-pay | Admitting: Diagnostic Neuroimaging

## 2019-10-27 ENCOUNTER — Ambulatory Visit (INDEPENDENT_AMBULATORY_CARE_PROVIDER_SITE_OTHER): Payer: 59 | Admitting: Diagnostic Neuroimaging

## 2019-10-27 ENCOUNTER — Other Ambulatory Visit: Payer: Self-pay

## 2019-10-27 VITALS — BP 111/69 | HR 64 | Ht 79.0 in | Wt 198.0 lb

## 2019-10-27 DIAGNOSIS — G250 Essential tremor: Secondary | ICD-10-CM | POA: Diagnosis not present

## 2019-10-27 DIAGNOSIS — G252 Other specified forms of tremor: Secondary | ICD-10-CM | POA: Diagnosis not present

## 2019-10-27 MED ORDER — PROPRANOLOL HCL 20 MG PO TABS: 20.0000 mg | ORAL_TABLET | Freq: Two times a day (BID) | ORAL | 6 refills | Status: DC

## 2019-10-27 NOTE — Patient Instructions (Signed)
-   increase propranolol to 20mg  daily; after 1-2 weeks may increase to twice a day  - may consider primidone

## 2019-10-27 NOTE — Progress Notes (Signed)
[facility]  PATIENT: Jose Holden DOB: [birth year]  REFERRING CLINICIAN: Hoyt Koch, * HISTORY FROM: patient  REASON FOR VISIT: new consult    HISTORICAL  CHIEF COMPLAINT:  Chief Complaint  Patient presents with   Tremors    rm 7 New Pt "tremors of left hand x several years, gradually gotten worse"    HISTORY OF PRESENT ILLNESS:   64 year old male here for evaluation of left hand tremor.  Patient is a Counselling psychologist, playing since childhood.  In 1993 he had some type of injury strain to his left hand digit 4.  Around 2001 he noticed tremor in his left thumb when his hand with certain position on the cello.  Symptoms are described to worsen over time.  He is able to compensate little bit for this.  It is affecting his ability to perform.  Symptoms worse when he is under stress or performing for an audience.  He notices some symptoms during practice.  Patient also has been on propranolol intermittently for "performance anxiety".  He uses this few times a month with mild relief.   REVIEW OF SYSTEMS: Full 14 system review of systems performed and negative with exception of: As per HPI.  ALLERGIES: No Known Allergies  HOME MEDICATIONS: Outpatient Medications Prior to Visit  Medication Sig Dispense Refill   aspirin 325 MG tablet Take 325 mg by mouth daily.     DULoxetine (CYMBALTA) 30 MG capsule Take 30 mg by mouth daily.     propranolol (INDERAL) 20 MG tablet Take 1 tablet (20 mg total) by mouth as needed (takes as needed before performing with his cello). 30 tablet 2   sildenafil (REVATIO) 20 MG tablet Take 1-5 tablets (20-100 mg total) by mouth daily as needed (erection). 100 tablet 11   vardenafil (LEVITRA) 10 MG tablet Take 1 tablet (10 mg total) by mouth daily as needed for erectile dysfunction. 30 tablet 11   betamethasone valerate ointment (VALISONE) 0.1 % Apply 1 application topically 2 (two) times daily. (Patient not taking:  Reported on 10/27/2019) 455 g 0   No facility-administered medications prior to visit.    PAST MEDICAL HISTORY: Past Medical History:  Diagnosis Date   A-fib ([place])    On Aspirin only   Anxiety    Cataract    removed left eye that caused retinal detachment x2   ED (erectile dysfunction)    Heart murmur    past hx per pt    Lactose intolerance    Retinal detachment    x2- left eye     PAST SURGICAL HISTORY: Past Surgical History:  Procedure Laterality Date   APPENDECTOMY     COLONOSCOPY     10 yr ago [place]- normal per pt    EYE SURGERY     detached retina surgery x2   LAPAROSCOPIC APPENDECTOMY N/A 08/29/2015   Procedure: APPENDECTOMY LAPAROSCOPIC;  Surgeon: Johnathan Hausen, MD;  Location: [facility];  Service: General;  Laterality: N/A;   UPPER GASTROINTESTINAL ENDOSCOPY      FAMILY HISTORY: Family History  Problem Relation Age of Onset   Cancer Mother        Pancreas CA   Pancreatic cancer Mother    Cancer Father        Brain CA   Brain cancer Father    Seizures Father        grand mal   Colon cancer Neg Hx    Esophageal cancer Neg Hx    Rectal cancer  Neg Hx    Colon polyps Neg Hx    Stomach cancer Neg Hx     SOCIAL HISTORY: Social History   Socioeconomic History   Marital status: Single    Spouse name: Not on file   Number of children: 0   Years of education: Not on file   Highest education level: Not on file  Occupational History   Occupation: classical Facilities manager  Tobacco Use   Smoking status: Never Smoker   Smokeless tobacco: Never Used  Substance and Sexual Activity   Alcohol use: Yes    Alcohol/week: 0.0 standard drinks    Comment: mod- rare per pt 09-10-2019   Drug use: Never   Sexual activity: Not Currently  Other Topics Concern   Not on file  Social History Narrative   Not on file   Social Determinants of Health   Financial Resource Strain:    Difficulty of Paying Living Expenses:   Food Insecurity:     Worried About Charity fundraiser in the Last Year:    Arboriculturist in the Last Year:   Transportation Needs:    Film/video editor (Medical):    Lack of Transportation (Non-Medical):   Physical Activity:    Days of Exercise per Week:    Minutes of Exercise per Session:   Stress:    Feeling of Stress :   Social Connections:    Frequency of Communication with Friends and Family:    Frequency of Social Gatherings with Friends and Family:    Attends Religious Services:    Active Member of Clubs or Organizations:    Attends Music therapist:    Marital Status:   Intimate Partner Violence:    Fear of Current or Ex-Partner:    Emotionally Abused:    Physically Abused:    Sexually Abused:      PHYSICAL EXAM  GENERAL EXAM/CONSTITUTIONAL: Vitals:  Vitals:   10/27/19 1313  BP: 111/69  Pulse: 64  Weight: 198 lb (89.8 kg)  Height: 6\' 7"  (2.007 m)     Body mass index is 22.31 kg/m. Wt Readings from Last 3 Encounters:  10/27/19 198 lb (89.8 kg)  09/24/19 192 lb 3.2 oz (87.2 kg)  09/10/19 190 lb (86.2 kg)     Patient is in no distress; well developed, nourished and groomed; neck is supple  CARDIOVASCULAR:  Examination of carotid arteries is normal; no carotid bruits  Regular rate and rhythm, no murmurs  Examination of peripheral vascular system by observation and palpation is normal  EYES:  Ophthalmoscopic exam of optic discs and posterior segments is normal; no papilledema or hemorrhages  No exam data present  MUSCULOSKELETAL:  Gait, strength, tone, movements noted in Neurologic exam below  NEUROLOGIC: MENTAL STATUS:  No flowsheet data found.  awake, alert, oriented to person, place and time  recent and remote memory intact  normal attention and concentration  language fluent, comprehension intact, naming intact  fund of knowledge appropriate  CRANIAL NERVE:   2nd - no papilledema on fundoscopic exam  2nd, 3rd,  4th, 6th - pupils equal and reactive to light, visual fields full to confrontation, extraocular muscles intact, no nystagmus  5th - facial sensation symmetric  7th - facial strength symmetric  8th - hearing intact  9th - palate elevates symmetrically, uvula midline  11th - shoulder shrug symmetric  12th - tongue protrusion midline  MOTOR:   normal bulk and tone, full strength in the BUE, BLE  MINIMAL  POSTURAL TREMOR IN LUE (with holding hands out; placing hand on cello; archimedes spiral)  SENSORY:   normal and symmetric to light touch, temperature, vibration  COORDINATION:   finger-nose-finger, fine finger movements normal  REFLEXES:   deep tendon reflexes present and symmetric  GAIT/STATION:   narrow based gait     DIAGNOSTIC DATA (LABS, IMAGING, TESTING) - I reviewed patient records, labs, notes, testing and imaging myself where available.  Lab Results  Component Value Date   WBC 5.5 07/28/2019   HGB 15.7 07/28/2019   HCT 46.1 07/28/2019   MCV 93.2 07/28/2019   PLT 230.0 07/28/2019      Component Value Date/Time   NA 141 09/24/2019 1147   K 4.2 09/24/2019 1147   CL 104 09/24/2019 1147   CO2 30 09/24/2019 1147   GLUCOSE 90 09/24/2019 1147   BUN 37 (H) 09/24/2019 1147   CREATININE 1.27 09/24/2019 1147   CALCIUM 9.8 09/24/2019 1147   PROT 7.0 09/24/2019 1147   ALBUMIN 4.5 09/24/2019 1147   AST 23 09/24/2019 1147   ALT 18 09/24/2019 1147   ALKPHOS 52 09/24/2019 1147   BILITOT 0.7 09/24/2019 1147   GFRNONAA >60 08/29/2015 2229   GFRAA >60 08/29/2015 2229   Lab Results  Component Value Date   CHOL 149 07/28/2019   HDL 34.70 (L) 07/28/2019   LDLCALC 100 (H) 07/28/2019   TRIG 75.0 07/28/2019   CHOLHDL 4 07/28/2019   No results found for: HGBA1C Lab Results  Component Value Date   VITAMINB12 337 09/24/2019   Lab Results  Component Value Date   TSH 7.04 (H) 09/24/2019    10/09/19 CT head [I reviewed images myself and agree with  interpretation. -VRP]  - Unremarkable noncontrast head CT.    ASSESSMENT AND PLAN  64 y.o. year old male here with:  Dx:  1. Postural tremor   2. Essential tremor     PLAN:  POSTURAL TREMOR (likely essential tremor) - increase propranolol to 20mg  daily; after 1-2 weeks may increase to twice a day - may consider primidone  Meds ordered this encounter  Medications   propranolol (INDERAL) 20 MG tablet    Sig: Take 1 tablet (20 mg total) by mouth 2 (two) times daily.    Dispense:  60 tablet    Refill:  6   Return in about 6 months (around 04/27/2020).    Penni Bombard, MD [id number], [id number] PM Certified in Neurology, Neurophysiology and Neuroimaging  [facility] [address] [place], [place] [address] [phone]

## 2019-11-25 ENCOUNTER — Encounter: Payer: 59 | Admitting: Gastroenterology

## 2019-12-02 ENCOUNTER — Telehealth: Payer: Self-pay

## 2019-12-02 ENCOUNTER — Encounter: Payer: Self-pay | Admitting: Internal Medicine

## 2019-12-02 NOTE — Telephone Encounter (Deleted)
Error

## 2020-01-06 ENCOUNTER — Other Ambulatory Visit: Payer: Self-pay

## 2020-01-06 ENCOUNTER — Telehealth: Payer: Self-pay

## 2020-01-06 ENCOUNTER — Ambulatory Visit: Payer: 59

## 2020-01-06 NOTE — Telephone Encounter (Signed)
Pt returned call and r/s to tomorrow 8/12 at 8:00am in rm 51.

## 2020-01-06 NOTE — Telephone Encounter (Signed)
Attempted to Contact pt x 1 at 2:28 pm 01/06/20 Lt VM that a second attempt would be made in 5 mins.  Attempted to Contact pt x 1 at 2:25 pm 01/06/20-- no VM left  Attempted to Contact pt x 1 at 2:40 pm 01/06/20-- left VM requesting call back by 5:00 pm today to prevent cancellation of scheduled procedure and to reschedule PV.

## 2020-01-07 ENCOUNTER — Telehealth: Payer: Self-pay | Admitting: *Deleted

## 2020-01-07 ENCOUNTER — Ambulatory Visit (AMBULATORY_SURGERY_CENTER): Payer: 59 | Admitting: *Deleted

## 2020-01-07 ENCOUNTER — Other Ambulatory Visit: Payer: Self-pay

## 2020-01-07 VITALS — Ht 79.0 in | Wt 190.0 lb

## 2020-01-07 DIAGNOSIS — Z1211 Encounter for screening for malignant neoplasm of colon: Secondary | ICD-10-CM

## 2020-01-07 NOTE — Progress Notes (Signed)
Prep instructions sent via MyChart per patient request.

## 2020-01-07 NOTE — Progress Notes (Signed)
No egg or soy allergy known to patient  No issues with past sedation with any surgeries or procedures no intubation problems in the past  No FH of Malignant Hyperthermia No diet pills per patient No home 02 use per patient  No blood thinners per patient  Pt denies issues with constipation  No A fib or A flutter  EMMI video to pt or via [place] 19 guidelines implemented in PV today with Pt and RN   ++ patient reports having Suprep from previously cancelled colonoscopy.  Due to the COVID-19 pandemic we are asking patients to follow these guidelines. Please only bring one care partner. Please be aware that your care partner may wait in the car in the parking lot or if they feel like they will be too hot to wait in the car, they may wait in the lobby on the 4th floor. All care partners are required to wear a mask the entire time (we do not have any that we can provide them), they need to practice social distancing, and we will do a Covid check for all patient's and care partners when you arrive. Also we will check their temperature and your temperature. If the care partner waits in their car they need to stay in the parking lot the entire time and we will call them on their cell phone when the patient is ready for discharge so they can bring the car to the front of the building. Also all patient's will need to wear a mask into building.

## 2020-01-07 NOTE — Telephone Encounter (Signed)
Virtual pre visit completed.  

## 2020-01-14 ENCOUNTER — Ambulatory Visit: Payer: 59 | Admitting: Internal Medicine

## 2020-01-19 ENCOUNTER — Other Ambulatory Visit: Payer: Self-pay

## 2020-01-19 ENCOUNTER — Telehealth: Payer: Self-pay | Admitting: Gastroenterology

## 2020-01-19 ENCOUNTER — Ambulatory Visit: Payer: 59 | Admitting: Family

## 2020-01-19 ENCOUNTER — Encounter: Payer: Self-pay | Admitting: Family

## 2020-01-19 VITALS — BP 110/72 | HR 74 | Temp 98.1°F | Ht 79.0 in | Wt 197.0 lb

## 2020-01-19 DIAGNOSIS — H6123 Impacted cerumen, bilateral: Secondary | ICD-10-CM

## 2020-01-19 NOTE — Telephone Encounter (Signed)
Asking if he can have the broth of the chicken noodle soup- instructed to strain all goodies from the soup and JUST drink the broth- he verbalized understanding

## 2020-01-19 NOTE — Telephone Encounter (Signed)
Pt is requesting a call back regarding some questions relating to his prep prior to his colonoscopy scheduled for tomorrow

## 2020-01-19 NOTE — Progress Notes (Signed)
Jose Holden is a 64 y.o. male with the following history as recorded in [employer]:  Patient Active Problem List   Diagnosis Date Noted  . Tremor of left hand 09/24/2019  . Routine general medical examination at a health care facility 07/28/2019  . Anxiety 05/23/2018  . Esophageal stenosis 02/27/2018  . Dysphagia 02/13/2018  . Erectile dysfunction 02/13/2018  . Atrial fibrillation, chronic ([place]) 02/13/2018    Current Outpatient Medications  Medication Sig Dispense Refill  . aspirin 325 MG tablet Take 325 mg by mouth daily.    . Na Sulfate-K Sulfate-Mg Sulf (SUPREP BOWEL PREP KIT PO) Take 1 kit by mouth once. Colonoscopy bowel prep    . propranolol (INDERAL) 20 MG tablet Take 1 tablet (20 mg total) by mouth 2 (two) times daily. 60 tablet 6  . sildenafil (REVATIO) 20 MG tablet Take 1-5 tablets (20-100 mg total) by mouth daily as needed (erection). 100 tablet 11  . betamethasone valerate ointment (VALISONE) 0.1 % Apply 1 application topically 2 (two) times daily. (Patient not taking: Reported on 10/27/2019) 455 g 0  . DULoxetine (CYMBALTA) 30 MG capsule Take 30 mg by mouth daily. (Patient not taking: Reported on 01/07/2020)    . vardenafil (LEVITRA) 10 MG tablet Take 1 tablet (10 mg total) by mouth daily as needed for erectile dysfunction. (Patient not taking: Reported on 01/07/2020) 30 tablet 11   No current facility-administered medications for this visit.    Allergies: Patient has no known allergies.  Past Medical History:  Diagnosis Date  . A-fib ([facility])    On Aspirin only  . Anxiety   . Cataract    removed left eye that caused retinal detachment x2  . ED (erectile dysfunction)   . Heart murmur    past hx per pt   . Lactose intolerance   . Retinal detachment    x2- left eye     Past Surgical History:  Procedure Laterality Date  . APPENDECTOMY    . COLONOSCOPY     10 yr ago [place]- normal per pt   . EYE SURGERY     detached retina surgery x2  . LAPAROSCOPIC APPENDECTOMY  N/A 08/29/2015   Procedure: APPENDECTOMY LAPAROSCOPIC;  Surgeon: Johnathan Hausen, MD;  Location: [facility];  Service: General;  Laterality: N/A;  . UPPER GASTROINTESTINAL ENDOSCOPY      Family History  Problem Relation Age of Onset  . Cancer Mother        Pancreas CA  . Pancreatic cancer Mother   . Cancer Father        Brain CA  . Brain cancer Father   . Seizures Father        grand mal  . Colon cancer Neg Hx   . Esophageal cancer Neg Hx   . Rectal cancer Neg Hx   . Colon polyps Neg Hx   . Stomach cancer Neg Hx     Social History   Tobacco Use  . Smoking status: Never Smoker  . Smokeless tobacco: Never Used  Substance Use Topics  . 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  Head: Normocephalic and atraumatic  Eyes: Sclera and conjunctiva clear; pupils round and reactive to light; extraocular movements intact  Ears: External normal; initial exam shows bilateral cerumen impaction; after lavage, canals clear; tympanic membranes normal  Oropharynx: Pink, supple. No suspicious lesions  Neck: Supple without thyromegaly, adenopathy  Lungs: Respirations unlabored;  Neurologic: Alert and oriented; speech intact; face symmetrical; moves all extremities well; CNII-XII intact without focal deficit   Assessment:  1. Bilateral impacted cerumen     Plan:  Ear lavage completed with no difficulty; patient could tell good improvement in symptoms; follow up as needed otherwise;  This visit occurred during the SARS-CoV-2  public health emergency.  Safety protocols were in place, including screening questions prior to the visit, additional usage of staff PPE, and extensive cleaning of exam room while observing appropriate contact time as indicated for disinfecting solutions.     No follow-ups on file.  No orders of the defined types were placed in this encounter.   Requested Prescriptions    No prescriptions requested or ordered in this encounter

## 2020-01-19 NOTE — Telephone Encounter (Signed)
LM to return call 1034 am

## 2020-01-20 ENCOUNTER — Encounter: Payer: Self-pay | Admitting: Gastroenterology

## 2020-01-20 ENCOUNTER — Ambulatory Visit (AMBULATORY_SURGERY_CENTER): Payer: 59 | Admitting: Gastroenterology

## 2020-01-20 VITALS — BP 100/62 | HR 83 | Temp 98.0°F | Resp 18 | Ht 79.0 in | Wt 198.0 lb

## 2020-01-20 DIAGNOSIS — D120 Benign neoplasm of cecum: Secondary | ICD-10-CM

## 2020-01-20 DIAGNOSIS — Z1211 Encounter for screening for malignant neoplasm of colon: Secondary | ICD-10-CM | POA: Diagnosis not present

## 2020-01-20 DIAGNOSIS — D122 Benign neoplasm of ascending colon: Secondary | ICD-10-CM

## 2020-01-20 MED ORDER — SODIUM CHLORIDE 0.9 % IV SOLN: 500.0000 mL | Freq: Once | INTRAVENOUS | Status: DC

## 2020-01-20 NOTE — Progress Notes (Signed)
Report to PACU, RN, vss, BBS= Clear.  

## 2020-01-20 NOTE — Op Note (Signed)
[place] Patient Name: Jose Holden Procedure Date: 01/20/2020 1:33 PM MRN: [id number] Endoscopist: Ladene Artist , MD Age: 64 Referring MD:  Date of Birth: Oct 31, 1955 Gender: Male Account #: [id number] Procedure:                Colonoscopy Indications:              Screening for colorectal malignant neoplasm Medicines:                Monitored Anesthesia Care Procedure:                Pre-Anesthesia Assessment:                           - Prior to the procedure, a History and Physical                            was performed, and patient medications and                            allergies were reviewed. The patient's tolerance of                            previous anesthesia was also reviewed. The risks                            and benefits of the procedure and the sedation                            options and risks were discussed with the patient.                            All questions were answered, and informed consent                            was obtained. Prior Anticoagulants: The patient has                            taken no previous anticoagulant or antiplatelet                            agents. ASA Grade Assessment: II - A patient with                            mild systemic disease. After reviewing the risks                            and benefits, the patient was deemed in                            satisfactory condition to undergo the procedure.                           After obtaining informed consent, the colonoscope  was passed under direct vision. Throughout the                            procedure, the patient's blood pressure, pulse, and                            oxygen saturations were monitored continuously. The                            Colonoscope was introduced through the anus and                            advanced to the the cecum, identified by                            appendiceal orifice and  ileocecal valve. The                            ileocecal valve, appendiceal orifice, and rectum                            were photographed. The quality of the bowel                            preparation was good after extensive lavage and                            suction. The colonoscopy was performed without                            difficulty. The patient tolerated the procedure                            well. Scope In: 1:41:07 PM Scope Out: 2:04:18 PM Scope Withdrawal Time: 0 hours 18 minutes 14 seconds  Total Procedure Duration: 0 hours 23 minutes 11 seconds  Findings:                 The perianal and digital rectal examinations were                            normal.                           Four sessile polyps were found in the ascending                            colon (3) and cecum (1). The polyps were 6 to 12 mm                            in size. These polyps were removed with a cold                            snare. Resection and retrieval were complete.  Internal hemorrhoids were found during                            retroflexion. The hemorrhoids were small and Grade                            I (internal hemorrhoids that do not prolapse).                           The exam was otherwise without abnormality on                            direct and retroflexion views. Complications:            No immediate complications. Estimated blood loss:                            None. Estimated Blood Loss:     Estimated blood loss: none. Impression:               - Four 6 to 12 mm polyps in the ascending colon and                            in the cecum, removed with a cold snare. Resected                            and retrieved.                           - Internal hemorrhoids.                           - The examination was otherwise normal on direct                            and retroflexion views. Recommendation:           - Repeat  colonoscopy after studies are complete for                            surveillance based on pathology results.                           - Patient has a contact number available for                            emergencies. The signs and symptoms of potential                            delayed complications were discussed with the                            patient. Return to normal activities tomorrow.                            Written discharge instructions were provided to the  patient.                           - Resume previous diet.                           - Continue present medications.                           - Await pathology results. Ladene Artist, MD 01/20/2020 2:08:15 PM This report has been signed electronically.

## 2020-01-20 NOTE — Patient Instructions (Signed)
Please, read all of the handouts given to you by your recovery room nurse.  YOU HAD AN ENDOSCOPIC PROCEDURE TODAY AT THE [facility]:   Refer to the procedure report that was given to you for any specific questions about what was found during the examination.  If the procedure report does not answer your questions, please call your gastroenterologist to clarify.  If you requested that your care partner not be given the details of your procedure findings, then the procedure report has been included in a sealed envelope for you to review at your convenience later.  YOU SHOULD EXPECT: Some feelings of bloating in the abdomen. Passage of more gas than usual.  Walking can help get rid of the air that was put into your GI tract during the procedure and reduce the bloating. If you had a lower endoscopy (such as a colonoscopy or flexible sigmoidoscopy) you may notice spotting of blood in your stool or on the toilet paper. If you underwent a bowel prep for your procedure, you may not have a normal bowel movement for a few days.  Please Note:  You might notice some irritation and congestion in your nose or some drainage.  This is from the oxygen used during your procedure.  There is no need for concern and it should clear up in a day or so.  SYMPTOMS TO REPORT IMMEDIATELY:   Following lower endoscopy (colonoscopy or flexible sigmoidoscopy):  Excessive amounts of blood in the stool  Significant tenderness or worsening of abdominal pains  Swelling of the abdomen that is new, acute  Fever of 100F or higher   For urgent or emergent issues, a gastroenterologist can be reached at any hour by calling [phone]. Do not use MyChart messaging for urgent concerns.    DIET:  We do recommend a small meal at first, but then you may proceed to your regular diet.  Drink plenty of fluids but you should avoid alcoholic beverages for 24 hours.  ACTIVITY:  You should plan to take it easy for the rest of  today and you should NOT DRIVE or use heavy machinery until tomorrow (because of the sedation medicines used during the test).    FOLLOW UP: Our staff will call the number listed on your records 48-72 hours following your procedure to check on you and address any questions or concerns that you may have regarding the information given to you following your procedure. If we do not reach you, we will leave a message.  We will attempt to reach you two times.  During this call, we will ask if you have developed any symptoms of COVID 19. If you develop any symptoms (ie: fever, flu-like symptoms, shortness of breath, cough etc.) before then, please call [phone].  If you test positive for Covid 19 in the 2 weeks post procedure, please call and report this information to us.    If any biopsies were taken you will be contacted by phone or by letter within the next 1-3 weeks.  Please call us at [phone] if you have not heard about the biopsies in 3 weeks.    SIGNATURES/CONFIDENTIALITY: You and/or your care partner have signed paperwork which will be entered into your electronic medical record.  These signatures attest to the fact that that the information above on your After Visit Summary has been reviewed and is understood.  Full responsibility of the confidentiality of this discharge information lies with you and/or your care-partner. 

## 2020-01-20 NOTE — Progress Notes (Signed)
Pt's states no medical or surgical changes since previsit or office visit.Pt's states no medical or surgical changes since previsit or office visit.Pt's states no medical or surgical changes since previsit or office visit.  CW vitals and SM IV.

## 2020-01-22 ENCOUNTER — Telehealth: Payer: Self-pay

## 2020-01-22 ENCOUNTER — Telehealth: Payer: Self-pay | Admitting: *Deleted

## 2020-01-22 NOTE — Telephone Encounter (Signed)
First post procedure call, left message.

## 2020-01-22 NOTE — Telephone Encounter (Signed)
°  Follow up Call-  Call back number 01/20/2020  Post procedure Call Back phone  # [phone]  Permission to leave phone message Yes  Some recent data might be hidden     Patient questions:  Do you have a fever, pain , or abdominal swelling? No. Pain Score  0 *  Have you tolerated food without any problems? Yes.    Have you been able to return to your normal activities? Yes.    Do you have any questions about your discharge instructions: Diet   No. Medications  No. Follow up visit  No.  Do you have questions or concerns about your Care? No.  Actions: * If pain score is 4 or above: No action needed, pain <4.  1. Have you developed a fever since your procedure? no  2.   Have you had an respiratory symptoms (SOB or cough) since your procedure? no  3.   Have you tested positive for COVID 19 since your procedure no  4.   Have you had any family members/close contacts diagnosed with the COVID 19 since your procedure? no   If yes to any of these questions please route to Joylene John, RN and Joella Prince, RN

## 2020-01-28 ENCOUNTER — Encounter: Payer: Self-pay | Admitting: Gastroenterology

## 2020-04-18 ENCOUNTER — Encounter: Payer: Self-pay | Admitting: Internal Medicine

## 2020-04-18 ENCOUNTER — Ambulatory Visit: Payer: 59 | Admitting: Internal Medicine

## 2020-04-18 ENCOUNTER — Other Ambulatory Visit: Payer: Self-pay

## 2020-04-18 VITALS — BP 120/78 | HR 82 | Temp 98.0°F | Ht 79.0 in | Wt 199.0 lb

## 2020-04-18 DIAGNOSIS — N529 Male erectile dysfunction, unspecified: Secondary | ICD-10-CM

## 2020-04-18 MED ORDER — VARDENAFIL HCL 20 MG PO TABS: 20.0000 mg | ORAL_TABLET | Freq: Every day | ORAL | 11 refills | Status: DC | PRN

## 2020-04-18 MED ORDER — SILDENAFIL CITRATE 20 MG PO TABS: 20.0000 mg | ORAL_TABLET | Freq: Every day | ORAL | 11 refills | Status: DC | PRN

## 2020-04-18 NOTE — Assessment & Plan Note (Signed)
Paper copy levitra generic which should be reasonable price at [facility] as well as refill sildenafil and we talked about safe dosing maximum 100 mg at a time in case levitra is not reasonable he will continue with sildenafil.

## 2020-04-18 NOTE — Progress Notes (Signed)
   Subjective:   Patient ID: Jose Holden, male    DOB: [birth year], 64 y.o.   MRN: [id number]  HPI The patient is a 64 YO man coming in for concerns about ED. Has new relationship and is wondering about safe dosages for sildenafil. Previously had done well with levitra but this was very expensive at the time (200 per pill). He denies new urinary symptoms. Sildenafil 60 mg worked fine but he is wondering appropriate dosing. Denies fevers or chills. Stable problem for the last several years.   Review of Systems  Constitutional: Negative.   HENT: Negative.   Eyes: Negative.   Respiratory: Negative for cough, chest tightness and shortness of breath.   Cardiovascular: Negative for chest pain, palpitations and leg swelling.  Gastrointestinal: Negative for abdominal distention, abdominal pain, constipation, diarrhea, nausea and vomiting.  Genitourinary: Negative for difficulty urinating and dysuria.  Musculoskeletal: Negative.   Skin: Negative.   Neurological: Negative.   Psychiatric/Behavioral: Negative.     Objective:  Physical Exam Constitutional:      Appearance: He is well-developed.  HENT:     Head: Normocephalic and atraumatic.  Cardiovascular:     Rate and Rhythm: Normal rate and regular rhythm.  Pulmonary:     Effort: Pulmonary effort is normal. No respiratory distress.     Breath sounds: Normal breath sounds. No wheezing or rales.  Abdominal:     General: Bowel sounds are normal. There is no distension.     Palpations: Abdomen is soft.     Tenderness: There is no abdominal tenderness. There is no rebound.  Musculoskeletal:     Cervical back: Normal range of motion.  Skin:    General: Skin is warm and dry.  Neurological:     Mental Status: He is alert and oriented to person, place, and time.     Coordination: Coordination normal.     Vitals:   04/18/20 0826  BP: 120/78  Pulse: 82  Temp: 98 F (36.7 C)  TempSrc: Oral  SpO2: 98%  Weight: 199 lb (90.3 kg)    Height: 6\' 7"  (2.007 m)    This visit occurred during the SARS-CoV-2 public health emergency.  Safety protocols were in place, including screening questions prior to the visit, additional usage of staff PPE, and extensive cleaning of exam room while observing appropriate contact time as indicated for disinfecting solutions.   Assessment & Plan:

## 2020-04-18 NOTE — Patient Instructions (Signed)
We have sent in the sildenafil refill and a hard copy of the levitra generic. Go to [facility] and this should be reasonable at [place].

## 2020-06-13 ENCOUNTER — Other Ambulatory Visit: Payer: Self-pay

## 2020-06-14 ENCOUNTER — Ambulatory Visit: Payer: 59 | Admitting: Internal Medicine

## 2020-06-14 ENCOUNTER — Encounter: Payer: Self-pay | Admitting: Internal Medicine

## 2020-06-14 VITALS — BP 116/70 | HR 71 | Temp 97.7°F | Resp 18 | Ht 79.0 in | Wt 198.4 lb

## 2020-06-14 DIAGNOSIS — R251 Tremor, unspecified: Secondary | ICD-10-CM

## 2020-06-14 DIAGNOSIS — I482 Chronic atrial fibrillation, unspecified: Secondary | ICD-10-CM

## 2020-06-14 DIAGNOSIS — R634 Abnormal weight loss: Secondary | ICD-10-CM

## 2020-06-14 DIAGNOSIS — Z0000 Encounter for general adult medical examination without abnormal findings: Secondary | ICD-10-CM | POA: Diagnosis not present

## 2020-06-14 LAB — LIPID PANEL
Cholesterol: 141 mg/dL (ref 0–200)
HDL: 39.8 mg/dL (ref >39.00)
LDL Cholesterol: 90 mg/dL (ref 0–99)
NonHDL: 101.22
Total CHOL/HDL Ratio: 4
Triglycerides: 56 mg/dL (ref 0.0–149.0)
VLDL: 11.2 mg/dL (ref 0.0–40.0)

## 2020-06-14 LAB — COMPREHENSIVE METABOLIC PANEL
ALT: 18 U/L (ref 0–53)
AST: 21 U/L (ref 0–37)
Albumin: 4.4 g/dL (ref 3.5–5.2)
Alkaline Phosphatase: 65 U/L (ref 39–117)
BUN: 28 mg/dL — ABNORMAL HIGH (ref 6–23)
CO2: 28 mEq/L (ref 19–32)
Calcium: 10 mg/dL (ref 8.4–10.5)
Chloride: 105 mEq/L (ref 96–112)
Creatinine, Ser: 1.01 mg/dL (ref 0.40–1.50)
GFR: 79.43 mL/min (ref >60.00)
Glucose, Bld: 71 mg/dL (ref 70–99)
Potassium: 4.4 mEq/L (ref 3.5–5.1)
Sodium: 139 mEq/L (ref 135–145)
Total Bilirubin: 0.5 mg/dL (ref 0.2–1.2)
Total Protein: 6.9 g/dL (ref 6.0–8.3)

## 2020-06-14 LAB — CBC
HCT: 47.1 % (ref 39.0–52.0)
Hemoglobin: 16.1 g/dL (ref 13.0–17.0)
MCHC: 34.2 g/dL (ref 30.0–36.0)
MCV: 89.4 fl (ref 78.0–100.0)
Platelets: 229 10*3/uL (ref 150.0–400.0)
RBC: 5.27 Mil/uL (ref 4.22–5.81)
RDW: 13.8 % (ref 11.5–15.5)
WBC: 6.1 10*3/uL (ref 4.0–10.5)

## 2020-06-14 LAB — TSH: TSH: 5.41 u[IU]/mL — ABNORMAL HIGH (ref 0.35–4.50)

## 2020-06-14 LAB — T4, FREE: Free T4: 0.8 ng/dL (ref 0.60–1.60)

## 2020-06-14 MED ORDER — PROPRANOLOL HCL 20 MG PO TABS
20.0000 mg | ORAL_TABLET | Freq: Two times a day (BID) | ORAL | 6 refills | Status: DC
Start: 2020-06-14 — End: 2020-09-11

## 2020-06-14 NOTE — Patient Instructions (Addendum)
We will check the blood work today. ° ° °

## 2020-06-14 NOTE — Progress Notes (Signed)
   Subjective:   Patient ID: Jose Holden, male    DOB: [birth year], 65 y.o.   MRN: [id number]  HPI The patient is a 65 YO man coming in for concerns about his A fib (taking aspirin 325 mg daily, wanted to know if this is enough for prevention of stroke, previously with some imaging of the chest which did show the heart, last 2019) and weight loss (over the past few years weight has gone down, not monitoring regularly at home, up to date on colon cancer screening, last labs 08/2019, overall weight feels stable but gradually down, his girlfriend is concerned) and CV risk (overall concerned, denies chest pains or pressure, denies change in activity, is able to exercise without chest pain).   Review of Systems  Constitutional: Positive for unexpected weight change. Negative for activity change, appetite change, chills and fatigue.  HENT: Negative.   Eyes: Negative.   Respiratory: Negative for cough, chest tightness and shortness of breath.   Cardiovascular: Negative for chest pain, palpitations and leg swelling.  Gastrointestinal: Negative for abdominal distention, abdominal pain, constipation, diarrhea, nausea and vomiting.  Musculoskeletal: Negative.   Skin: Negative.   Neurological: Negative.   Psychiatric/Behavioral: Negative.     Objective:  Physical Exam Constitutional:      Appearance: He is well-developed and well-nourished.  HENT:     Head: Normocephalic and atraumatic.  Eyes:     Extraocular Movements: EOM normal.  Cardiovascular:     Rate and Rhythm: Normal rate and regular rhythm.     Comments: Sounds regular Pulmonary:     Effort: Pulmonary effort is normal. No respiratory distress.     Breath sounds: Normal breath sounds. No wheezing or rales.  Abdominal:     General: Bowel sounds are normal. There is no distension.     Palpations: Abdomen is soft.     Tenderness: There is no abdominal tenderness. There is no rebound.  Musculoskeletal:        General: No edema.      Cervical back: Normal range of motion.  Skin:    General: Skin is warm and dry.  Neurological:     Mental Status: He is alert and oriented to person, place, and time.     Coordination: Coordination normal.  Psychiatric:        Mood and Affect: Mood and affect normal.     Vitals:   06/14/20 1435  BP: 116/70  Pulse: 71  Resp: 18  Temp: 97.7 F (36.5 C)  TempSrc: Oral  SpO2: 96%  Weight: 198 lb 6.4 oz (90 kg)  Height: 6\' 7"  (2.007 m)    This visit occurred during the SARS-CoV-2 public health emergency.  Safety protocols were in place, including screening questions prior to the visit, additional usage of staff PPE, and extensive cleaning of exam room while observing appropriate contact time as indicated for disinfecting solutions.   Assessment & Plan:

## 2020-06-15 ENCOUNTER — Encounter: Payer: Self-pay | Admitting: Internal Medicine

## 2020-06-15 DIAGNOSIS — R634 Abnormal weight loss: Secondary | ICD-10-CM | POA: Insufficient documentation

## 2020-06-15 LAB — VITAMIN B12: Vitamin B-12: 322 pg/mL (ref 211–911)

## 2020-06-15 NOTE — Assessment & Plan Note (Signed)
Weight stable overall since last year. Will check thyroid and vitamin levels. Up to date on cancer screening. Checking CBC.

## 2020-06-15 NOTE — Assessment & Plan Note (Signed)
Stable on propranolol and will keep medications the same.

## 2020-06-15 NOTE — Assessment & Plan Note (Signed)
Using propranolol for tremor. On aspirin 325 mg daily which is adequate. CHADS2VASC score 0 today and explained to him the implications of this.

## 2020-08-17 ENCOUNTER — Other Ambulatory Visit: Payer: Self-pay

## 2020-08-18 ENCOUNTER — Encounter: Payer: Self-pay | Admitting: Internal Medicine

## 2020-08-18 ENCOUNTER — Ambulatory Visit (INDEPENDENT_AMBULATORY_CARE_PROVIDER_SITE_OTHER): Payer: 59 | Admitting: Internal Medicine

## 2020-08-18 DIAGNOSIS — H6123 Impacted cerumen, bilateral: Secondary | ICD-10-CM | POA: Insufficient documentation

## 2020-08-18 NOTE — Progress Notes (Signed)
   Subjective:   Patient ID: Jose Holden, male    DOB: 03-07-1956, 65 y.o.   MRN: [id number]  HPI The patient is a 65 YO man coming in for concerns about his ears. Previously they have had wax buildup and this feels similar. Also has been swimming recently and has stuffiness in his ears. Some hearing change. Denies fevers or chills.   Review of Systems  Constitutional: Negative.   HENT: Positive for hearing loss.   Eyes: Negative.   Respiratory: Negative for cough, chest tightness and shortness of breath.   Cardiovascular: Negative for chest pain, palpitations and leg swelling.  Gastrointestinal: Negative for abdominal distention, abdominal pain, constipation, diarrhea, nausea and vomiting.  Musculoskeletal: Negative.   Skin: Negative.   Neurological: Negative.   Psychiatric/Behavioral: Negative.     Objective:  Physical Exam Constitutional:      Appearance: He is well-developed.  HENT:     Head: Normocephalic and atraumatic.     Comments:  both ears canal impacted with copious hard wax, examination post ear lavage canal is clear and no bleeding or complications noted. Cardiovascular:     Rate and Rhythm: Normal rate and regular rhythm.  Pulmonary:     Effort: Pulmonary effort is normal. No respiratory distress.     Breath sounds: Normal breath sounds. No wheezing or rales.  Abdominal:     General: Bowel sounds are normal. There is no distension.     Palpations: Abdomen is soft.     Tenderness: There is no abdominal tenderness. There is no rebound.  Musculoskeletal:     Cervical back: Normal range of motion.  Skin:    General: Skin is warm and dry.  Neurological:     Mental Status: He is alert and oriented to person, place, and time.     Coordination: Coordination normal.     Vitals:   08/18/20 1052  BP: 118/80  Pulse: 75  Resp: 18  Temp: 98.7 F (37.1 C)  TempSrc: Oral  SpO2: 95%  Weight: 197 lb 12.8 oz (89.7 kg)  Height: 6\' 7"  (2.007 m)    This visit  occurred during the SARS-CoV-2 public health emergency.  Safety protocols were in place, including screening questions prior to the visit, additional usage of staff PPE, and extensive cleaning of exam room while observing appropriate contact time as indicated for disinfecting solutions.   Assessment & Plan:

## 2020-08-18 NOTE — Assessment & Plan Note (Signed)
Ears irrigated in office, tolerated well. After irrigation TM clear bilaterally.

## 2020-09-04 ENCOUNTER — Encounter (HOSPITAL_COMMUNITY): Payer: Self-pay | Admitting: Emergency Medicine

## 2020-09-04 ENCOUNTER — Inpatient Hospital Stay (HOSPITAL_COMMUNITY)
Admission: EM | Admit: 2020-09-04 | Discharge: 2020-09-11 | DRG: 378 | Disposition: A | Discharge status: Home / Self Care | Payer: 59 | Attending: Internal Medicine | Admitting: Internal Medicine

## 2020-09-04 ENCOUNTER — Ambulatory Visit (HOSPITAL_COMMUNITY)
Admission: EM | Admit: 2020-09-04 | Discharge: 2020-09-04 | Disposition: A | Discharge status: Nursing Home (Includes ICF) | Payer: 59

## 2020-09-04 ENCOUNTER — Other Ambulatory Visit: Payer: Self-pay

## 2020-09-04 DIAGNOSIS — R011 Cardiac murmur, unspecified: Secondary | ICD-10-CM | POA: Diagnosis present

## 2020-09-04 DIAGNOSIS — D62 Acute posthemorrhagic anemia: Secondary | ICD-10-CM | POA: Diagnosis present

## 2020-09-04 DIAGNOSIS — Z8719 Personal history of other diseases of the digestive system: Secondary | ICD-10-CM

## 2020-09-04 DIAGNOSIS — Z800 Family history of malignant neoplasm of digestive organs: Secondary | ICD-10-CM | POA: Diagnosis not present

## 2020-09-04 DIAGNOSIS — Z808 Family history of malignant neoplasm of other organs or systems: Secondary | ICD-10-CM | POA: Diagnosis not present

## 2020-09-04 DIAGNOSIS — E739 Lactose intolerance, unspecified: Secondary | ICD-10-CM | POA: Diagnosis present

## 2020-09-04 DIAGNOSIS — N529 Male erectile dysfunction, unspecified: Secondary | ICD-10-CM | POA: Diagnosis present

## 2020-09-04 DIAGNOSIS — I444 Left anterior fascicular block: Secondary | ICD-10-CM | POA: Diagnosis present

## 2020-09-04 DIAGNOSIS — K648 Other hemorrhoids: Secondary | ICD-10-CM | POA: Diagnosis present

## 2020-09-04 DIAGNOSIS — R42 Dizziness and giddiness: Secondary | ICD-10-CM | POA: Diagnosis present

## 2020-09-04 DIAGNOSIS — K222 Esophageal obstruction: Secondary | ICD-10-CM | POA: Diagnosis present

## 2020-09-04 DIAGNOSIS — I480 Paroxysmal atrial fibrillation: Secondary | ICD-10-CM | POA: Diagnosis present

## 2020-09-04 DIAGNOSIS — Z20822 Contact with and (suspected) exposure to covid-19: Secondary | ICD-10-CM | POA: Diagnosis present

## 2020-09-04 DIAGNOSIS — K922 Gastrointestinal hemorrhage, unspecified: Secondary | ICD-10-CM | POA: Diagnosis present

## 2020-09-04 DIAGNOSIS — I482 Chronic atrial fibrillation, unspecified: Secondary | ICD-10-CM | POA: Diagnosis present

## 2020-09-04 DIAGNOSIS — K921 Melena: Principal | ICD-10-CM | POA: Diagnosis present

## 2020-09-04 DIAGNOSIS — I959 Hypotension, unspecified: Secondary | ICD-10-CM | POA: Diagnosis present

## 2020-09-04 DIAGNOSIS — I493 Ventricular premature depolarization: Secondary | ICD-10-CM | POA: Diagnosis present

## 2020-09-04 DIAGNOSIS — H3322 Serous retinal detachment, left eye: Secondary | ICD-10-CM | POA: Diagnosis present

## 2020-09-04 DIAGNOSIS — F419 Anxiety disorder, unspecified: Secondary | ICD-10-CM | POA: Diagnosis present

## 2020-09-04 DIAGNOSIS — Z79899 Other long term (current) drug therapy: Secondary | ICD-10-CM | POA: Diagnosis not present

## 2020-09-04 DIAGNOSIS — R195 Other fecal abnormalities: Secondary | ICD-10-CM

## 2020-09-04 DIAGNOSIS — D649 Anemia, unspecified: Secondary | ICD-10-CM

## 2020-09-04 DIAGNOSIS — Z7982 Long term (current) use of aspirin: Secondary | ICD-10-CM | POA: Diagnosis not present

## 2020-09-04 LAB — COMPREHENSIVE METABOLIC PANEL
ALT: 18 U/L (ref 0–44)
AST: 21 U/L (ref 15–41)
Albumin: 3.1 g/dL — ABNORMAL LOW (ref 3.5–5.0)
Alkaline Phosphatase: 39 U/L (ref 38–126)
Anion gap: 7 (ref 5–15)
BUN: 38 mg/dL — ABNORMAL HIGH (ref 8–23)
CO2: 23 mmol/L (ref 22–32)
Calcium: 8.5 mg/dL — ABNORMAL LOW (ref 8.9–10.3)
Chloride: 107 mmol/L (ref 98–111)
Creatinine, Ser: 0.92 mg/dL (ref 0.61–1.24)
GFR, Estimated: >60 mL/min (ref >60)
Glucose, Bld: 118 mg/dL — ABNORMAL HIGH (ref 70–99)
Potassium: 3.8 mmol/L (ref 3.5–5.1)
Sodium: 137 mmol/L (ref 135–145)
Total Bilirubin: 0.8 mg/dL (ref 0.3–1.2)
Total Protein: 5 g/dL — ABNORMAL LOW (ref 6.5–8.1)

## 2020-09-04 LAB — RESP PANEL BY RT-PCR (FLU A&B, COVID) ARPGX2
Influenza A by PCR: NEGATIVE
Influenza B by PCR: NEGATIVE
SARS Coronavirus 2 by RT PCR: NEGATIVE

## 2020-09-04 LAB — HEMOGLOBIN AND HEMATOCRIT, BLOOD
HCT: 23.3 % — ABNORMAL LOW (ref 39.0–52.0)
Hemoglobin: 7.9 g/dL — ABNORMAL LOW (ref 13.0–17.0)

## 2020-09-04 LAB — POC OCCULT BLOOD, ED: Fecal Occult Bld: POSITIVE — AB

## 2020-09-04 LAB — CBC
HCT: 25.5 % — ABNORMAL LOW (ref 39.0–52.0)
Hemoglobin: 8.4 g/dL — ABNORMAL LOW (ref 13.0–17.0)
MCH: 32.1 pg (ref 26.0–34.0)
MCHC: 32.9 g/dL (ref 30.0–36.0)
MCV: 97.3 fL (ref 80.0–100.0)
Platelets: 237 10*3/uL (ref 150–400)
RBC: 2.62 MIL/uL — ABNORMAL LOW (ref 4.22–5.81)
RDW: 14.3 % (ref 11.5–15.5)
WBC: 6.1 10*3/uL (ref 4.0–10.5)
nRBC: 0 % (ref 0.0–0.2)

## 2020-09-04 LAB — PREPARE RBC (CROSSMATCH)

## 2020-09-04 MED ORDER — SODIUM CHLORIDE 0.9 % IV SOLN: 250.0000 mL | INTRAVENOUS | Status: DC | PRN

## 2020-09-04 MED ORDER — SODIUM CHLORIDE 0.9 % IV BOLUS
1000.0000 mL | Freq: Once | INTRAVENOUS | Status: AC
  Administered 2020-09-04: 1000 mL via INTRAVENOUS

## 2020-09-04 MED ORDER — SODIUM CHLORIDE 0.9 % IV SOLN
8.0000 mg/h | INTRAVENOUS | Status: DC
  Administered 2020-09-04 – 2020-09-05 (×2): 8 mg/h via INTRAVENOUS
  Filled 2020-09-04 (×2): qty 80

## 2020-09-04 MED ORDER — SODIUM CHLORIDE 0.9 % IV SOLN: 8.0000 mg/h | INTRAVENOUS | Status: DC

## 2020-09-04 MED ORDER — SODIUM CHLORIDE 0.9% FLUSH
3.0000 mL | Freq: Two times a day (BID) | INTRAVENOUS | Status: DC
  Administered 2020-09-04 – 2020-09-11 (×8): 3 mL via INTRAVENOUS

## 2020-09-04 MED ORDER — SODIUM CHLORIDE 0.9 % IV SOLN: 80.0000 mg | Freq: Once | INTRAVENOUS | Status: DC

## 2020-09-04 MED ORDER — SODIUM CHLORIDE 0.9% FLUSH
3.0000 mL | INTRAVENOUS | Status: DC | PRN
  Administered 2020-09-04: 3 mL via INTRAVENOUS

## 2020-09-04 MED ORDER — SODIUM CHLORIDE 0.9 % IV SOLN
10.0000 mL/h | Freq: Once | INTRAVENOUS | Status: AC
  Administered 2020-09-04: 10 mL/h via INTRAVENOUS

## 2020-09-04 MED ORDER — PANTOPRAZOLE SODIUM 40 MG IV SOLR: 40.0000 mg | Freq: Two times a day (BID) | INTRAVENOUS | Status: DC

## 2020-09-04 MED ORDER — SODIUM CHLORIDE 0.9 % IV SOLN
80.0000 mg | Freq: Once | INTRAVENOUS | Status: AC
  Administered 2020-09-04: 80 mg via INTRAVENOUS
  Filled 2020-09-04: qty 80

## 2020-09-04 NOTE — ED Provider Notes (Signed)
[facility]    CSN: [id number] Arrival date & time: 09/04/20  1021      History   Chief Complaint Chief Complaint  Patient presents with  . Lower GI bleed    HPI Jose Holden is a 65 y.o. male.   Mr. Jose Holden presents today with a several day history of melena.  He describes stools as tarry and difficult to clean.  He has had some hematochezia but is unsure if this is related to hemorrhoids or something else.  Recently he has developed palpitations and took a propranolol which exacerbated symptoms.  He has a history of atrial fibrillation and is currently managed with high-dose aspirin does not take any additional blood thinning medications.  He has a history of melena when he had appendicitis but is not had additional GI bleeds or other conditions.  He reports fatigue and lightheadedness with changing position.  Despite the symptoms he has been able to perform daily activities including driving to [place] for performance.  He denies regular NSAID use.  He denies associated abdominal pain, nausea, vomiting, syncope, chest pain.     Past Medical History:  Diagnosis Date  . A-fib (HCC)    On Aspirin only  . Anxiety   . Cataract    removed left eye that caused retinal detachment x2  . ED (erectile dysfunction)   . Heart murmur    past hx per pt   . Lactose intolerance   . Retinal detachment    x2- left eye     Patient Active Problem List   Diagnosis Date Noted  . Hearing loss due to cerumen impaction, bilateral 08/18/2020  . Weight loss 06/15/2020  . Tremor of left hand 09/24/2019  . Routine general medical examination at a health care facility 07/28/2019  . Anxiety 05/23/2018  . Esophageal stenosis 02/27/2018  . Dysphagia 02/13/2018  . Erectile dysfunction 02/13/2018  . Atrial fibrillation, chronic ([place]) 02/13/2018    Past Surgical History:  Procedure Laterality Date  . APPENDECTOMY    . COLONOSCOPY     10 yr ago [place]- normal per pt   . EYE SURGERY      detached retina surgery x2  . LAPAROSCOPIC APPENDECTOMY N/A 08/29/2015   Procedure: APPENDECTOMY LAPAROSCOPIC;  Surgeon: Johnathan Hausen, MD;  Location: [facility];  Service: General;  Laterality: N/A;  . UPPER GASTROINTESTINAL ENDOSCOPY         Home Medications    Prior to Admission medications   Medication Sig Start Date End Date Taking? Authorizing Provider  aspirin 325 MG tablet Take 325 mg by mouth daily.   Yes [provider]  betamethasone valerate ointment (VALISONE) 0.1 % Apply 1 application topically 2 (two) times daily. 05/22/18   Hoyt Koch, MD  DULoxetine (CYMBALTA) 30 MG capsule Take 30 mg by mouth daily.     [provider]  propranolol (INDERAL) 20 MG tablet Take 1 tablet (20 mg total) by mouth 2 (two) times daily. 06/14/20   Hoyt Koch, MD  sildenafil (REVATIO) 20 MG tablet Take 1-5 tablets (20-100 mg total) by mouth daily as needed (erection). 04/18/20   Hoyt Koch, MD  vardenafil (LEVITRA) 20 MG tablet Take 1 tablet (20 mg total) by mouth daily as needed for erectile dysfunction. 04/18/20   Hoyt Koch, MD    Family History Family History  Problem Relation Age of Onset  . Cancer Mother        Pancreas CA  . Pancreatic cancer  Mother   . Cancer Father        Brain CA  . Brain cancer Father   . Seizures Father        grand mal  . Colon cancer Neg Hx   . Esophageal cancer Neg Hx   . Rectal cancer Neg Hx   . Colon polyps Neg Hx   . Stomach cancer Neg Hx     Social History Social History   Tobacco Use  . Smoking status: Never Smoker  . Smokeless tobacco: Never Used  Vaping Use  . Vaping Use: Never used  Substance Use Topics  . Alcohol use: Yes    Alcohol/week: 0.0 standard drinks    Comment: mod- rare per pt 09-10-2019  . Drug use: Never     Allergies   Patient has no known allergies.   Review of Systems Review of Systems  Constitutional: Positive for activity change and fatigue.  Negative for appetite change and fever.  Respiratory: Negative for cough and shortness of breath.   Cardiovascular: Positive for palpitations. Negative for chest pain and leg swelling.  Gastrointestinal: Positive for blood in stool and diarrhea. Negative for abdominal pain, nausea and vomiting.  Neurological: Positive for light-headedness. Negative for dizziness and headaches.     Physical Exam Triage Vital Signs ED Triage Vitals  Enc Vitals Group     BP 09/04/20 1058 (!) 99/58     Pulse Rate 09/04/20 1058 83     Resp 09/04/20 1058 20     Temp --      Temp src --      SpO2 09/04/20 1058 99 %     Weight --      Height --      Head Circumference --      Peak Flow --      Pain Score 09/04/20 1107 0     Pain Loc --      Pain Edu? --      Excl. in [place]? --    No data found.  Updated Vital Signs BP 110/70   Pulse 83 Comment: irregular; reports having a-fib  Resp 20   SpO2 99%   Visual Acuity Right Eye Distance:   Left Eye Distance:   Bilateral Distance:    Right Eye Near:   Left Eye Near:    Bilateral Near:     Physical Exam Vitals reviewed.  Constitutional:      General: He is awake.     Appearance: Normal appearance. He is normal weight. He is not ill-appearing.     Comments: Very pleasant male appears stated age sitting comfortably on exam table in no acute distress  HENT:     Head: Normocephalic and atraumatic.     Mouth/Throat:     Pharynx: No oropharyngeal exudate, posterior oropharyngeal erythema or uvula swelling.  Cardiovascular:     Rate and Rhythm: Tachycardia present.     Heart sounds: No murmur heard.   Pulmonary:     Effort: Pulmonary effort is normal.     Breath sounds: Normal breath sounds. No stridor. No wheezing, rhonchi or rales.     Comments: Clear to auscultation bilaterally Abdominal:     Palpations: Abdomen is soft.     Tenderness: There is no abdominal tenderness.  Neurological:     Mental Status: He is alert.  Psychiatric:         Behavior: Behavior is cooperative.      UC Treatments / Results  Labs (all labs  ordered are listed, but only abnormal results are displayed) Labs Reviewed - No data to display  EKG   Radiology No results found.  Procedures Procedures (including critical care time)  Medications Ordered in UC Medications - No data to display  Initial Impression / Assessment and Plan / UC Course  I have reviewed the triage vital signs and the nursing notes.  Pertinent labs & imaging results that were available during my care of the patient were reviewed by me and considered in my medical decision making (see chart for details).     Given consolation of symptoms and vital signs patient was instructed to follow-up with the ER for further evaluation and management.  He was open to this and will go directly to emergency room following visit but declined EMS transport.  He was hemodynamically stable at the time of discharge for private transport.  Final Clinical Impressions(s) / UC Diagnoses   Final diagnoses:  Melena  Episodic lightheadedness     Discharge Instructions     GO TO THE ER.    ED Prescriptions    None     PDMP not reviewed this encounter.   Terrilee Croak, PA-C 09/04/20 1113

## 2020-09-04 NOTE — ED Provider Notes (Addendum)
[facility] EMERGENCY DEPARTMENT Provider Note   CSN: [id number] Arrival date & time: 09/04/20  1117     History Chief Complaint  Patient presents with  . GI Bleeding    Jose Holden is a 65 y.o. male.  64 year old male with past medical history of A. fib (takes a full aspirin daily), presents with complaint of feeling lightheaded with standing and fatigued.  Patient states that he has had black tarry stools for the past 4 to 5 days, denies abdominal pain.  Patient was seen in urgent care today, found to be hypotensive and was sent to the emergency room for further evaluation.  Patient reports seeing all of our GI in the past for colonoscopy, found precancerous polyp and is scheduled for repeat in 3 years.        Past Medical History:  Diagnosis Date  . A-fib (HCC)    On Aspirin only  . Anxiety   . Cataract    removed left eye that caused retinal detachment x2  . ED (erectile dysfunction)   . Heart murmur    past hx per pt   . Lactose intolerance   . Retinal detachment    x2- left eye     Patient Active Problem List   Diagnosis Date Noted  . Melena 09/04/2020  . Anemia associated with acute blood loss 09/04/2020  . Hearing loss due to cerumen impaction, bilateral 08/18/2020  . Weight loss 06/15/2020  . Tremor of left hand 09/24/2019  . Routine general medical examination at a health care facility 07/28/2019  . Anxiety 05/23/2018  . Esophageal stenosis 02/27/2018  . Dysphagia 02/13/2018  . Erectile dysfunction 02/13/2018  . Atrial fibrillation, chronic ([place]) 02/13/2018    Past Surgical History:  Procedure Laterality Date  . APPENDECTOMY    . COLONOSCOPY     10 yr ago [place]- normal per pt   . EYE SURGERY     detached retina surgery x2  . LAPAROSCOPIC APPENDECTOMY N/A 08/29/2015   Procedure: APPENDECTOMY LAPAROSCOPIC;  Surgeon: Johnathan Hausen, MD;  Location: [facility];  Service: General;  Laterality: N/A;  . UPPER GASTROINTESTINAL ENDOSCOPY          Family History  Problem Relation Age of Onset  . Cancer Mother        Pancreas CA  . Pancreatic cancer Mother   . Cancer Father        Brain CA  . Brain cancer Father   . Seizures Father        grand mal  . Colon cancer Neg Hx   . Esophageal cancer Neg Hx   . Rectal cancer Neg Hx   . Colon polyps Neg Hx   . Stomach cancer Neg Hx     Social History   Tobacco Use  . Smoking status: Never Smoker  . Smokeless tobacco: Never Used  Vaping Use  . Vaping Use: Never used  Substance Use Topics  . Alcohol use: Yes    Alcohol/week: 0.0 standard drinks    Comment: mod- rare per pt 09-10-2019  . Drug use: Never    Home Medications Prior to Admission medications   Medication Sig Start Date End Date Taking? Authorizing Provider  aspirin 325 MG tablet Take 325 mg by mouth daily.    [provider]  betamethasone valerate ointment (VALISONE) 0.1 % Apply 1 application topically 2 (two) times daily. 05/22/18   Hoyt Koch, MD  DULoxetine (CYMBALTA) 30 MG capsule Take 30 mg by  mouth daily.     [provider]  propranolol (INDERAL) 20 MG tablet Take 1 tablet (20 mg total) by mouth 2 (two) times daily. 06/14/20   Hoyt Koch, MD  sildenafil (REVATIO) 20 MG tablet Take 1-5 tablets (20-100 mg total) by mouth daily as needed (erection). 04/18/20   Hoyt Koch, MD  vardenafil (LEVITRA) 20 MG tablet Take 1 tablet (20 mg total) by mouth daily as needed for erectile dysfunction. 04/18/20   Hoyt Koch, MD    Allergies    Patient has no known allergies.  Review of Systems   Review of Systems  Constitutional: Positive for fatigue. Negative for fever.  Respiratory: Negative for shortness of breath.   Cardiovascular: Negative for chest pain and palpitations.  Gastrointestinal: Positive for blood in stool. Negative for abdominal pain, constipation, nausea and vomiting.  Genitourinary: Negative for decreased urine volume and  difficulty urinating.  Musculoskeletal: Negative for arthralgias and myalgias.  Skin: Negative for rash and wound.  Allergic/Immunologic: Negative for immunocompromised state.  Neurological: Positive for light-headedness.  Psychiatric/Behavioral: Negative for confusion.  All other systems reviewed and are negative.   Physical Exam Updated Vital Signs BP 101/62   Pulse 88   Temp 98.2 F (36.8 C) (Oral)   Resp 20   SpO2 100%   Physical Exam Vitals and nursing note reviewed. Exam conducted with a chaperone present.  Constitutional:      General: He is not in acute distress.    Appearance: He is well-developed. He is not diaphoretic.  HENT:     Head: Normocephalic and atraumatic.     Mouth/Throat:     Mouth: Mucous membranes are moist.  Eyes:     Conjunctiva/sclera: Conjunctivae normal.  Cardiovascular:     Rate and Rhythm: Bradycardia present. Rhythm irregular.     Pulses: Normal pulses.     Heart sounds: Normal heart sounds.  Pulmonary:     Effort: Pulmonary effort is normal.     Breath sounds: Normal breath sounds.  Abdominal:     Palpations: Abdomen is soft.     Tenderness: There is no abdominal tenderness.  Genitourinary:    Rectum: Guaiac result positive.     Comments: Melanotic stool Musculoskeletal:     Right lower leg: No edema.     Left lower leg: No edema.  Skin:    General: Skin is warm and dry.     Coloration: Skin is not pale.     Findings: No erythema or rash.  Neurological:     Mental Status: He is alert and oriented to person, place, and time.     Motor: No weakness.  Psychiatric:        Behavior: Behavior normal.     ED Results / Procedures / Treatments   Labs (all labs ordered are listed, but only abnormal results are displayed) Labs Reviewed  COMPREHENSIVE METABOLIC PANEL - Abnormal; Notable for the following components:      Result Value   Glucose, Bld 118 (*)    BUN 38 (*)    Calcium 8.5 (*)    Total Protein 5.0 (*)    Albumin 3.1  (*)    All other components within normal limits  CBC - Abnormal; Notable for the following components:   RBC 2.62 (*)    Hemoglobin 8.4 (*)    HCT 25.5 (*)    All other components within normal limits  POC OCCULT BLOOD, ED - Abnormal; Notable for the following components:  Fecal Occult Bld POSITIVE (*)    All other components within normal limits  RESP PANEL BY RT-PCR (FLU A&B, COVID) ARPGX2  TYPE AND SCREEN  PREPARE RBC (CROSSMATCH)    EKG EKG Interpretation  Date/Time:  Sunday September 04 2020 12:12:11 EDT Ventricular Rate:  74 PR Interval:  142 QRS Duration: 99 QT Interval:  371 QTC Calculation: 412 R Axis:   -59 Text Interpretation: Sinus rhythm Multiple premature complexes, vent & supraven LAD, consider left anterior fascicular block since last tracing no significant change Confirmed by Daleen Bo [phone]) on 09/04/2020 12:17:54 PM   Radiology No results found.  Procedures .Critical Care Performed by: Tacy Learn, PA-C Authorized by: Tacy Learn, PA-C   Critical care provider statement:    Critical care time (minutes):  45   Critical care was time spent personally by me on the following activities:  Discussions with consultants, evaluation of patient's response to treatment, examination of patient, ordering and performing treatments and interventions, ordering and review of laboratory studies, ordering and review of radiographic studies, pulse oximetry, re-evaluation of patient's condition, obtaining history from patient or surrogate and review of old charts     Medications Ordered in ED Medications  0.9 %  sodium chloride infusion (has no administration in time range)  sodium chloride 0.9 % bolus 1,000 mL (has no administration in time range)  pantoprazole (PROTONIX) 80 mg in sodium chloride 0.9 % 100 mL IVPB (has no administration in time range)  pantoprazole (PROTONIX) 80 mg in sodium chloride 0.9 % 100 mL (0.8 mg/mL) infusion (has no administration in  time range)  pantoprazole (PROTONIX) injection 40 mg (has no administration in time range)    ED Course  I have reviewed the triage vital signs and the nursing notes.  Pertinent labs & imaging results that were available during my care of the patient were reviewed by me and considered in my medical decision making (see chart for details).  Clinical Course as of 09/04/20 1410  Sun Sep 04, 1536  6239 65 year old male with complaint of feeling lightheaded with black stools for 4 to 5 days. History of chronic A. fib, takes a full aspirin daily, denies abdominal pain. On exam, does not appear pale however does become tachycardic easily with activity. Blood pressure 90 systolic at urgent care, on repeat is 109 in the emergency room, is orthostatic. Stool is melanotic and heme positive. CBC with hemoglobin of 8.4, previously 16. Patient is agreeable with transfusion, 1 unit ordered. CMP with creatinine of 0.92, elevated BUN at 38.  Case discussed with PA Berniece Pap with [facility] who will see the patient. Hospitalist paged for consult for admission.  Discussed results and plan of care with patient who verbalizes understanding. [LM]  1321 Discussed with hospitalist who will consult for admission. [LM]    Clinical Course User Index [LM] Roque Lias   MDM Rules/Calculators/A&P                          Final Clinical Impression(s) / ED Diagnoses Final diagnoses:  Acute GI bleeding  Melena  Symptomatic anemia    Rx / DC Orders ED Discharge Orders    None       Tacy Learn, PA-C 09/04/20 1411    Tacy Learn, PA-C 09/04/20 1541    Daleen Bo, MD 09/05/20 1125

## 2020-09-04 NOTE — ED Notes (Addendum)
E. Raspet, PA notified of pt VS and sxs.

## 2020-09-04 NOTE — ED Notes (Signed)
Patient is being discharged from the Urgent [facility] and sent to the Emergency Department via wheelchair by staff. Per E. Raspet, PA, patient is stable but in need of higher level of care due to lower GI bleed. Patient is aware and verbalizes understanding of plan of care.  Vitals:   09/04/20 1058 09/04/20 1106  BP: (!) 99/58 110/70  Pulse: 83   Resp: 20   SpO2: 99%

## 2020-09-04 NOTE — ED Triage Notes (Signed)
Reports black stools since Wednesday.  Sent from [facility].  Denies pain.  Reports lightheaded when he stands up quick.

## 2020-09-04 NOTE — Progress Notes (Signed)
Jeannette Corpus NP notified about H&H 7.9/23.3.

## 2020-09-04 NOTE — ED Notes (Signed)
Per provider, pt declining EMS transport to ED.

## 2020-09-04 NOTE — ED Triage Notes (Signed)
Pt c/o starting with black, tarry stool and blood in toilet after BM inconsistently x 4-5 days.  Denies any abd pain.  C/O lightheadedness when he stands up quickly.  States only takes ASA for a-fib, no blood thinners.

## 2020-09-04 NOTE — H&P (Signed)
History and Physical    Cochise Dinneen OEU:[id number] DOB: Jun 03, 1955 DOA: 09/04/2020  PCP: Hoyt Koch, MD  Patient coming from: Home  Chief Complaint: Weakness and dizziness  HPI: Jose Holden is a 65 y.o. male with medical history significant of esophageal stenosis, A. fib on aspirin comes in with 3 to 4 days of melanotic stool.  Patient denies any chronic heartburn symptoms.  He denies any NSAID usage such as BC powders or Goody powders over-the-counter he does take a daily aspirin for his A. fib.  He recently had a colonoscopy a year ago which removed 3 polyps which were premalignant.  He has had 6 to EGDs in the past which required esophageal dilation due to stenosis present.  He does not chronically take a PPI IR has never been told he needs to take a chronic PPI.  He denies any nausea vomiting.  He does have some dysphagia symptoms which she reports are his normal and is been no worse than normal.  He denies any vomiting.  Patient found to have a hemoglobin of 8 dropped from 16 several months ago.  He denies any abdominal pain.  He is currently being transfused 2 units of packed red blood cells in the emergency department and referred for admission for work-up for melanotic stool.  GI service has been called for consultation.    Review of Systems: As per HPI otherwise 10 point review of systems negative.   Past Medical History:  Diagnosis Date  . A-fib (HCC)    On Aspirin only  . Anxiety   . Cataract    removed left eye that caused retinal detachment x2  . ED (erectile dysfunction)   . Heart murmur    past hx per pt   . Lactose intolerance   . Retinal detachment    x2- left eye     Past Surgical History:  Procedure Laterality Date  . APPENDECTOMY    . COLONOSCOPY     10 yr ago [place]- normal per pt   . EYE SURGERY     detached retina surgery x2  . LAPAROSCOPIC APPENDECTOMY N/A 08/29/2015   Procedure: APPENDECTOMY LAPAROSCOPIC;  Surgeon: Johnathan Hausen, MD;   Location: [facility];  Service: General;  Laterality: N/A;  . UPPER GASTROINTESTINAL ENDOSCOPY       reports that he has never smoked. He has never used smokeless tobacco. He reports current alcohol use. He reports that he does not use drugs.  No Known Allergies  Family History  Problem Relation Age of Onset  . Cancer Mother        Pancreas CA  . Pancreatic cancer Mother   . Cancer Father        Brain CA  . Brain cancer Father   . Seizures Father        grand mal  . Colon cancer Neg Hx   . Esophageal cancer Neg Hx   . Rectal cancer Neg Hx   . Colon polyps Neg Hx   . Stomach cancer Neg Hx     Prior to Admission medications   Medication Sig Start Date End Date Taking? Authorizing Provider  aspirin 325 MG tablet Take 325 mg by mouth in the morning.   Yes [provider]  Cholecalciferol (VITAMIN D-3 PO) Take 1 tablet by mouth daily with breakfast.   Yes [provider]  propranolol (INDERAL) 20 MG tablet Take 1 tablet (20 mg total) by mouth 2 (two) times daily. Patient taking differently:  Take 20 mg by mouth 2 (two) times daily as needed (for performance-induced stress). 06/14/20  Yes Hoyt Koch, MD  SELENIUM PO Take 1 tablet by mouth daily with breakfast.   Yes [provider]  sildenafil (REVATIO) 20 MG tablet Take 1-5 tablets (20-100 mg total) by mouth daily as needed (erection). Patient taking differently: Take 20-100 mg by mouth daily as needed (for E.D.). 04/18/20  Yes Hoyt Koch, MD  SPIRULINA PO Take 1 tablet by mouth daily with breakfast.   Yes [provider]  vardenafil (LEVITRA) 20 MG tablet Take 1 tablet (20 mg total) by mouth daily as needed for erectile dysfunction. 04/18/20  Yes Hoyt Koch, MD  betamethasone valerate ointment (VALISONE) 0.1 % Apply 1 application topically 2 (two) times daily. Patient not taking: No sig reported 05/22/18   Hoyt Koch, MD    Physical Exam: Vitals:    09/04/20 1300 09/04/20 1314 09/04/20 1315 09/04/20 1329  BP: 109/73 109/73 109/82 101/62  Pulse: 80 93 87 88  Resp: (!) 21 17 (!) 22 20  Temp:  98.3 F (36.8 C)  98.2 F (36.8 C)  TempSrc:  Oral  Oral  SpO2: 100%  100% 100%      Constitutional: NAD, calm, comfortable Vitals:   09/04/20 1300 09/04/20 1314 09/04/20 1315 09/04/20 1329  BP: 109/73 109/73 109/82 101/62  Pulse: 80 93 87 88  Resp: (!) 21 17 (!) 22 20  Temp:  98.3 F (36.8 C)  98.2 F (36.8 C)  TempSrc:  Oral  Oral  SpO2: 100%  100% 100%   Eyes: PERRL, lids and conjunctivae normal ENMT: Mucous membranes are moist. Posterior pharynx clear of any exudate or lesions.Normal dentition.  Neck: normal, supple, no masses, no thyromegaly Respiratory: clear to auscultation bilaterally, no wheezing, no crackles. Normal respiratory effort. No accessory muscle use.  Cardiovascular: Regular rate and rhythm, no murmurs / rubs / gallops. No extremity edema. 2+ pedal pulses. No carotid bruits.  Abdomen: no tenderness, no masses palpated. No hepatosplenomegaly. Bowel sounds positive.  Musculoskeletal: no clubbing / cyanosis. No joint deformity upper and lower extremities. Good ROM, no contractures. Normal muscle tone.  Skin: no rashes, lesions, ulcers. No induration Neurologic: CN 2-12 grossly intact. Sensation intact, DTR normal. Strength 5/5 in all 4.  Psychiatric: Normal judgment and insight. Alert and oriented x 3. Normal mood.    Labs on Admission: I have personally reviewed following labs and imaging studies  CBC: Recent Labs  Lab 09/04/20 1138  WBC 6.1  HGB 8.4*  HCT 25.5*  MCV 97.3  PLT 650   Basic Metabolic Panel: Recent Labs  Lab 09/04/20 1138  NA 137  K 3.8  CL 107  CO2 23  GLUCOSE 118*  BUN 38*  CREATININE 0.92  CALCIUM 8.5*   GFR: CrCl cannot be calculated (Unknown ideal weight.). Liver Function Tests: Recent Labs  Lab 09/04/20 1138  AST 21  ALT 18  ALKPHOS 39  BILITOT 0.8  PROT 5.0*   ALBUMIN 3.1*   No results for input(s): LIPASE, AMYLASE in the last 168 hours. No results for input(s): AMMONIA in the last 168 hours. Coagulation Profile: No results for input(s): INR, PROTIME in the last 168 hours. Cardiac Enzymes: No results for input(s): CKTOTAL, CKMB, CKMBINDEX, TROPONINI in the last 168 hours. BNP (last 3 results) No results for input(s): PROBNP in the last 8760 hours. HbA1C: No results for input(s): HGBA1C in the last 72 hours. CBG: No results for input(s): GLUCAP  in the last 168 hours. Lipid Profile: No results for input(s): CHOL, HDL, LDLCALC, TRIG, CHOLHDL, LDLDIRECT in the last 72 hours. Thyroid Function Tests: No results for input(s): TSH, T4TOTAL, FREET4, T3FREE, THYROIDAB in the last 72 hours. Anemia Panel: No results for input(s): VITAMINB12, FOLATE, FERRITIN, TIBC, IRON, RETICCTPCT in the last 72 hours. Urine analysis:    Component Value Date/Time   COLORURINE AMBER (A) 08/29/2015 1305   APPEARANCEUR CLEAR 08/29/2015 1305   LABSPEC 1.029 08/29/2015 1305   PHURINE 5.5 08/29/2015 1305   GLUCOSEU NEGATIVE 08/29/2015 1305   HGBUR NEGATIVE 08/29/2015 1305   BILIRUBINUR SMALL (A) 08/29/2015 1305   BILIRUBINUR small (A) 08/29/2015 0955   KETONESUR 40 (A) 08/29/2015 1305   KETONESUR small (15) (A) 08/29/2015 0955   PROTEINUR 30 (A) 08/29/2015 1305   PROTEINUR =100 (A) 08/29/2015 0955   UROBILINOGEN 0.2 08/29/2015 0955   NITRITE NEGATIVE 08/29/2015 1305   NITRITE Negative 08/29/2015 0955   LEUKOCYTESUR NEGATIVE 08/29/2015 1305   Sepsis Labs: !!!!!!!!!!!!!!!!!!!!!!!!!!!!!!!!!!!!!!!!!!!! @LABRCNTIP (procalcitonin:4,lacticidven:4) ) Recent Results (from the past 240 hour(s))  Resp Panel by RT-PCR (Flu A&B, Covid) Nasopharyngeal Swab     Status: None   Collection Time: 09/04/20 12:24 PM   Specimen: Nasopharyngeal Swab; Nasopharyngeal(NP) swabs in vial transport medium  Result Value Ref Range Status   SARS Coronavirus 2 by RT PCR NEGATIVE NEGATIVE  Final    Comment: (NOTE) SARS-CoV-2 target nucleic acids are NOT DETECTED.  The SARS-CoV-2 RNA is generally detectable in upper respiratory specimens during the acute phase of infection. The lowest concentration of SARS-CoV-2 viral copies this assay can detect is 138 copies/mL. A negative result does not preclude SARS-Cov-2 infection and should not be used as the sole basis for treatment or other patient management decisions. A negative result may occur with  improper specimen collection/handling, submission of specimen other than nasopharyngeal swab, presence of viral mutation(s) within the areas targeted by this assay, and inadequate number of viral copies(<138 copies/mL). A negative result must be combined with clinical observations, patient history, and epidemiological information. The expected result is Negative.  Fact Sheet for Patients:  EntrepreneurPulse.com.au  Fact Sheet for Healthcare Providers:  IncredibleEmployment.be  This test is no t yet approved or cleared by the [place] FDA and  has been authorized for detection and/or diagnosis of SARS-CoV-2 by FDA under an Emergency Use Authorization (EUA). This EUA will remain  in effect (meaning this test can be used) for the duration of the COVID-19 declaration under Section 564(b)(1) of the Act, 21 U.S.C.section 360bbb-3(b)(1), unless the authorization is terminated  or revoked sooner.       Influenza A by PCR NEGATIVE NEGATIVE Final   Influenza B by PCR NEGATIVE NEGATIVE Final    Comment: (NOTE) The Xpert Xpress SARS-CoV-2/FLU/RSV plus assay is intended as an aid in the diagnosis of influenza from Nasopharyngeal swab specimens and should not be used as a sole basis for treatment. Nasal washings and aspirates are unacceptable for Xpert Xpress SARS-CoV-2/FLU/RSV testing.  Fact Sheet for Patients: EntrepreneurPulse.com.au  Fact Sheet for Healthcare  Providers: IncredibleEmployment.be  This test is not yet approved or cleared by the [place] FDA and has been authorized for detection and/or diagnosis of SARS-CoV-2 by FDA under an Emergency Use Authorization (EUA). This EUA will remain in effect (meaning this test can be used) for the duration of the COVID-19 declaration under Section 564(b)(1) of the Act, 21 U.S.C. section 360bbb-3(b)(1), unless the authorization is terminated or revoked.  Performed at [facility],  [address] [place], [place] [address]      Radiological Exams on Admission: No results found.  Old chart reviewed Case discussed with EDP  Assessment/Plan 65 year old male with melanotic stool  Principal Problem:    Melena-keep n.p.o. for potential EGD in the near future.  Hemoglobin has dropped significantly.  Transfuse 2 units of packed red blood cells.  Obtain GI consultation.  Active Problems:    Anemia associated with acute blood loss-due to GI losses.  Melanotic stool.  Hold any antiplatelet or anticoagulants medications at this time.    Atrial fibrillation, chronic (HCC)-noted, currently rate controlled    Esophageal stenosis-patient reports no clear etiology of this    GIB (gastrointestinal bleeding)-as above    Further recommendations pending on overall hospital course   DVT prophylaxis: SCDs Code Status: Full Family Communication: None Disposition Plan: 1 to 3 days Consults called: GI Admission status: Admission   Dartanion,Saraih Lorton A MD [facility]  If 7PM-7AM, please contact night-coverage [email or web address] Password [facility]  09/04/2020, 2:54 PM

## 2020-09-04 NOTE — ED Notes (Signed)
Report called to 5N

## 2020-09-04 NOTE — Consult Note (Addendum)
Referring Provider: Brandt Loosen PA-C Primary Care Physician:  Hoyt Koch, MD Primary Gastroenterologist:  Dr. Rush Landmark  Reason for Consultation: Upper GI bleed/melena, anemia  HPI: Jose Holden is a 65 y.o. male with a past medical history of anxiety, atrial fibrillation on aspirin, retinal detachment x 2, esophageal narrowing  and colon polyps.  He developed black tarry colored solid stool on Wednesday 08/31/2020.  He continued to pass 2-3 solid stools for the next 3 days and one black stool was loose.  He felt a bit lightheaded yesterday and he felt a bit anxious.  He took an Inderal tablet which he  takes PRN if he becomes anxious and he felt worse.  A few hours later his light headedness abated. 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  He completed playing in the symphony and drove back home without any difficulty.  He passed a another solid black stool last night and upon awakening this morning.  He felt lightheaded this morning but not as severe as his lightheadedness yesterday.  He presented to [facility] ED via EMS for further evaluation.  Labs in the ED showed a hemoglobin level of 8.4(baseline Hg 15 -16).  Hematocrit 25.5.  BUN 38 (baseline BUN 28).  FOBT positive. Sars coronavirus 2 test pending.  One unit of PRBCs transfusing. PPI infusion not yet started.   He denies having any chest pain or palpitations.  No current dizziness or feeling lightheaded.  He takes aspirin 325 mg daily.  He does not take any anticoagulation for his atrial fibrillation.  No other NSAID or steroid use.  No Pepto-Bismol or oral iron use.  He has a history of aphasia with an esophageal narrowing for which he underwent an EGD in 04/2016 in [place] which identified non-bleeding erosive gastropathy with benign appearing esophageal stenosis which was dilated. Gastric biopsies showed mild reactive changes, negative for Hpylori.  He continues to have  daily dysphagia which she describes as food is slow to pass down the esophagus.  Food does not get stuck in the esophagus.  No heartburn.  No upper or lower abdominal pain.  He typically passes normal formed brown bowel movements as days with exception of the black stools as reported above.  He also recalled passing a solid black stool prior to his appendectomy in 2017.  No rectal bleeding, however, yesterday when he passed a solid black stool he saw some red blood which seemed to lose from the black stool in the toilet water. No known history of an upper or lower GI bleed.  He underwent a colonoscopy by Dr. Fuller Plan 01/20/2020, for tubular adenomatous/sessile serrated polyps were removed.  No family history of esophageal, gastric or colon cancer.  His mother had pancreatic cancer.   Colonoscopy by Dr. Fuller Plan 01/20/2020: - Four 6 to 12 mm polyps in the ascending colon and in the cecum, removed with a cold snare. Resected and retrieved. - Internal hemorrhoids. - The examination was otherwise normal on direct and retroflexion views. Biopsy results: Surgical [P], colon, cecum, ascending, polyp (4) - TUBULAR ADENOMA. - MULTIPLE FRAGMENTS OF SESSILE SERRATED POLYP WITHOUT DYSPLASIA. - NO HIGH GRADE DYSPLASIA OR MALIGNANCY.  Past Medical History:  Diagnosis Date  . A-fib (HCC)    On Aspirin only  . Anxiety   . Cataract    removed left eye that caused retinal detachment x2  . ED (erectile dysfunction)   . Heart murmur  past hx per pt   . Lactose intolerance   . Retinal detachment    x2- left eye     Past Surgical History:  Procedure Laterality Date  . APPENDECTOMY    . COLONOSCOPY     10 yr ago [place]- normal per pt   . EYE SURGERY     detached retina surgery x2  . LAPAROSCOPIC APPENDECTOMY N/A 08/29/2015   Procedure: APPENDECTOMY LAPAROSCOPIC;  Surgeon: Johnathan Hausen, MD;  Location: [facility];  Service: General;  Laterality: N/A;  . UPPER GASTROINTESTINAL ENDOSCOPY      Prior to Admission  medications   Medication Sig Start Date End Date Taking? Authorizing Provider  aspirin 325 MG tablet Take 325 mg by mouth daily.    [provider]  betamethasone valerate ointment (VALISONE) 0.1 % Apply 1 application topically 2 (two) times daily. 05/22/18   Hoyt Koch, MD  DULoxetine (CYMBALTA) 30 MG capsule Take 30 mg by mouth daily.     [provider]  propranolol (INDERAL) 20 MG tablet Take 1 tablet (20 mg total) by mouth 2 (two) times daily. 06/14/20   Hoyt Koch, MD  sildenafil (REVATIO) 20 MG tablet Take 1-5 tablets (20-100 mg total) by mouth daily as needed (erection). 04/18/20   Hoyt Koch, MD  vardenafil (LEVITRA) 20 MG tablet Take 1 tablet (20 mg total) by mouth daily as needed for erectile dysfunction. 04/18/20   Hoyt Koch, MD    Current Facility-Administered Medications  Medication Dose Route Frequency Provider Last Rate Last Admin  . 0.9 %  sodium chloride infusion  10 mL/hr Intravenous Once Suella Broad A, PA-C      . sodium chloride 0.9 % bolus 1,000 mL  1,000 mL Intravenous Once Tacy Learn, PA-C       Current Outpatient Medications  Medication Sig Dispense Refill  . aspirin 325 MG tablet Take 325 mg by mouth daily.    . betamethasone valerate ointment (VALISONE) 0.1 % Apply 1 application topically 2 (two) times daily. 455 g 0  . DULoxetine (CYMBALTA) 30 MG capsule Take 30 mg by mouth daily.     . propranolol (INDERAL) 20 MG tablet Take 1 tablet (20 mg total) by mouth 2 (two) times daily. 60 tablet 6  . sildenafil (REVATIO) 20 MG tablet Take 1-5 tablets (20-100 mg total) by mouth daily as needed (erection). 100 tablet 11  . vardenafil (LEVITRA) 20 MG tablet Take 1 tablet (20 mg total) by mouth daily as needed for erectile dysfunction. 10 tablet 11    Allergies as of 09/04/2020  . (No Known Allergies)    Family History  Problem Relation Age of Onset  . Cancer Mother        Pancreas CA  . Pancreatic  cancer Mother   . Cancer Father        Brain CA  . Brain cancer Father   . Seizures Father        grand mal  . Colon cancer Neg Hx   . Esophageal cancer Neg Hx   . Rectal cancer Neg Hx   . Colon polyps Neg Hx   . Stomach cancer Neg Hx     Social History   Socioeconomic History  . Marital status: Single    Spouse name: Not on file  . Number of children: 0  . Years of education: Not on file  . Highest education level: Not on file  Occupational History  . Occupation: classical Facilities manager  Tobacco  Use  . Smoking status: Never Smoker  . Smokeless tobacco: Never Used  Vaping Use  . Vaping Use: Never used  Substance and Sexual Activity  . Alcohol use: Yes    Alcohol/week: 0.0 standard drinks    Comment: mod- rare per pt 09-10-2019  . Drug use: Never  . Sexual activity: Not Currently  Other Topics Concern  . Not on file  Social History Narrative  . Not on file   Social Determinants of Health   Financial Resource Strain: Not on file  Food Insecurity: Not on file  Transportation Needs: Not on file  Physical Activity: Not on file  Stress: Not on file  Social Connections: Not on file  Intimate Partner Violence: Not on file    Review of Systems: Gen: Denies fever, sweats or chills. No weight loss.  CV: Denies chest pain, palpitations or edema. Resp: Denies cough, shortness of breath of hemoptysis.  GI: See HPI. GU : Denies urinary burning, blood in urine, increased urinary frequency or incontinence. MS: Denies joint pain, muscles aches or weakness. Derm: Denies rash, itchiness, skin lesions or unhealing ulcers. Psych: + Anxiety.  Heme: Denies easy bruising, bleeding. Neuro:  Denies headaches, dizziness or paresthesias. Endo:  Denies any problems with DM, thyroid or adrenal function.  Physical Exam: Vital signs in last 24 hours: Temp:  [98.2 F (36.8 C)] 98.2 F (36.8 C) (04/10 1128) Pulse Rate:  [53-95] 95 (04/10 1200) Resp:  [16-20] 18 (04/10 1200) BP:  (99-112)/(58-81) 112/81 (04/10 1200) SpO2:  [97 %-100 %] 100 % (04/10 1200)   General:  Alert,  well-developed, well-nourished, pleasant and cooperative in NAD. Head:  Normocephalic and atraumatic. Eyes:  No scleral icterus. Conjunctiva pink. Ears:  Normal auditory acuity. Nose:  No deformity, discharge or lesions. Mouth:  Dentition intact. No ulcers or lesions.  Neck:  Supple. No lymphadenopathy or thyromegaly.  Lungs: Breath sounds clear throughout. Heart: Regular rate and rhythm, no murmurs. Abdomen: Soft, nondistended.  Nontender.  No masses.  Positive bowel sounds to all 4 quadrants. Rectal: Deferred.  FOBT positive.  Patient showed me photographs of black stool on the tissue and in the toilet bowl. Musculoskeletal:  Symmetrical without gross deformities.  Pulses:  Normal pulses noted. Extremities:  Without clubbing or edema. Neurologic:  Alert and  oriented x4. No focal deficits.  Skin:  Intact without significant lesions or rashes. Psych:  Alert and cooperative. Normal mood and affect.  Intake/Output from previous day: No intake/output data recorded. Intake/Output this shift: No intake/output data recorded.  Lab Results: Recent Labs    09/04/20 1138  WBC 6.1  HGB 8.4*  HCT 25.5*  PLT 237   BMET Recent Labs    09/04/20 1138  NA 137  K 3.8  CL 107  CO2 23  GLUCOSE 118*  BUN 38*  CREATININE 0.92  CALCIUM 8.5*   LFT Recent Labs    09/04/20 1138  PROT 5.0*  ALBUMIN 3.1*  AST 21  ALT 18  ALKPHOS 39  BILITOT 0.8   PT/INR No results for input(s): LABPROT, INR in the last 72 hours. Hepatitis Panel No results for input(s): HEPBSAG, HCVAB, HEPAIGM, HEPBIGM in the last 72 hours.    Studies/Results: No results found.  IMPRESSION/PLAN:  45.  65 year old male with upper GI bleed/melena and symptomatic acute blood loss anemia. On ASA 325mg  daily for afib. Hg 8.4 ( baseline Hg 15 -16). BUN 38.  -NPO -EGD benefits and risks discussed including risk with  sedation, risk of bleeding,  perforation and infection, Dr. Loletha Carrow to verify timing. -Pantoprazole 8mg /hr infusion  -Check H/H post transfusion  -IV fluids per the hospitalist  -Further recommendations per Dr. Loletha Carrow  2.  History of esophageal narrowing status post esophageal dilatation x 2, last EGD with esophageal dilatation was in 2017  3.  History of colon polyp per colonoscopy 01/20/2020  4.  History of atrial fibrillation on ASA 325 mg daily   Noralyn Pick  09/04/2020, 1:05 PM  I have reviewed the entire case in detail with the above APP and discussed the plan in detail.  Therefore, I agree with the diagnoses recorded above. In addition,  I have personally interviewed and examined the patient.  My additional thoughts are as follows:  Several days of melena and today symptomatic with lightheadedness. Daily aspirin, suspect PUD, less likely AVM.  No red flag symptoms to suggest malignancy.  Hemodynamically stable.  He is receiving PRBC transfusion, and when I saw him in the ED about 90 minutes ago, he was just about to start pantoprazole bolus and drip.  He is alert and conversational and in no distress.  Blood pressure 100/60, pulse 80s to 90s in A. fib, abdomen exam benign.  Plan is for upper endoscopy tomorrow morning with Dr.Pyrtle.  Procedure described in detail along with risks and benefits and he was agreeable.  The benefits and risks of the planned procedure were described in detail with the patient or (when appropriate) their health care proxy.  Risks were outlined as including, but not limited to, bleeding, infection, perforation, adverse medication reaction leading to cardiac or pulmonary decompensation, pancreatitis (if ERCP).  The limitation of incomplete mucosal visualization was also discussed.  No guarantees or warranties were given.  Patient at increased risk for cardiopulmonary complications of procedure due to medical comorbidities.  Please call overnight  on-call physician if patient shows signs of brisk GI bleeding and hemodynamic instability.   Nelida Meuse III Office:[phone]

## 2020-09-04 NOTE — Discharge Instructions (Addendum)
GO TO THE ER

## 2020-09-04 NOTE — H&P (View-Only) (Signed)
Referring Provider: Brandt Loosen PA-C Primary Care Physician:  Hoyt Koch, MD Primary Gastroenterologist:  Dr. Rush Landmark  Reason for Consultation: Upper GI bleed/melena, anemia  HPI: Jose Holden is a 65 y.o. male with a past medical history of anxiety, atrial fibrillation on aspirin, retinal detachment x 2, esophageal narrowing  and colon polyps.  He developed black tarry colored solid stool on Wednesday 08/31/2020.  He continued to pass 2-3 solid stools for the next 3 days and one black stool was loose.  He felt a bit lightheaded yesterday and he felt a bit anxious.  He took an Inderal tablet which he  takes PRN if he becomes anxious and he felt worse.  A few hours later his light headedness abated. 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  He completed playing in the symphony and drove back home without any difficulty.  He passed a another solid black stool last night and upon awakening this morning.  He felt lightheaded this morning but not as severe as his lightheadedness yesterday.  He presented to [facility] ED via EMS for further evaluation.  Labs in the ED showed a hemoglobin level of 8.4(baseline Hg 15 -16).  Hematocrit 25.5.  BUN 38 (baseline BUN 28).  FOBT positive. Sars coronavirus 2 test pending.  One unit of PRBCs transfusing. PPI infusion not yet started.   He denies having any chest pain or palpitations.  No current dizziness or feeling lightheaded.  He takes aspirin 325 mg daily.  He does not take any anticoagulation for his atrial fibrillation.  No other NSAID or steroid use.  No Pepto-Bismol or oral iron use.  He has a history of aphasia with an esophageal narrowing for which he underwent an EGD in 04/2016 in [place] which identified non-bleeding erosive gastropathy with benign appearing esophageal stenosis which was dilated. Gastric biopsies showed mild reactive changes, negative for Hpylori.  He continues to have  daily dysphagia which she describes as food is slow to pass down the esophagus.  Food does not get stuck in the esophagus.  No heartburn.  No upper or lower abdominal pain.  He typically passes normal formed brown bowel movements as days with exception of the black stools as reported above.  He also recalled passing a solid black stool prior to his appendectomy in 2017.  No rectal bleeding, however, yesterday when he passed a solid black stool he saw some red blood which seemed to lose from the black stool in the toilet water. No known history of an upper or lower GI bleed.  He underwent a colonoscopy by Dr. Fuller Plan 01/20/2020, for tubular adenomatous/sessile serrated polyps were removed.  No family history of esophageal, gastric or colon cancer.  His mother had pancreatic cancer.   Colonoscopy by Dr. Fuller Plan 01/20/2020: - Four 6 to 12 mm polyps in the ascending colon and in the cecum, removed with a cold snare. Resected and retrieved. - Internal hemorrhoids. - The examination was otherwise normal on direct and retroflexion views. Biopsy results: Surgical [P], colon, cecum, ascending, polyp (4) - TUBULAR ADENOMA. - MULTIPLE FRAGMENTS OF SESSILE SERRATED POLYP WITHOUT DYSPLASIA. - NO HIGH GRADE DYSPLASIA OR MALIGNANCY.  Past Medical History:  Diagnosis Date  . A-fib (HCC)    On Aspirin only  . Anxiety   . Cataract    removed left eye that caused retinal detachment x2  . ED (erectile dysfunction)   . Heart murmur  past hx per pt   . Lactose intolerance   . Retinal detachment    x2- left eye     Past Surgical History:  Procedure Laterality Date  . APPENDECTOMY    . COLONOSCOPY     10 yr ago [place]- normal per pt   . EYE SURGERY     detached retina surgery x2  . LAPAROSCOPIC APPENDECTOMY N/A 08/29/2015   Procedure: APPENDECTOMY LAPAROSCOPIC;  Surgeon: Johnathan Hausen, MD;  Location: [facility] ORS;  Service: General;  Laterality: N/A;  . UPPER GASTROINTESTINAL ENDOSCOPY      Prior to Admission  medications   Medication Sig Start Date End Date Taking? Authorizing Provider  aspirin 325 MG tablet Take 325 mg by mouth daily.    [provider]  betamethasone valerate ointment (VALISONE) 0.1 % Apply 1 application topically 2 (two) times daily. 05/22/18   Hoyt Koch, MD  DULoxetine (CYMBALTA) 30 MG capsule Take 30 mg by mouth daily.     [provider]  propranolol (INDERAL) 20 MG tablet Take 1 tablet (20 mg total) by mouth 2 (two) times daily. 06/14/20   Hoyt Koch, MD  sildenafil (REVATIO) 20 MG tablet Take 1-5 tablets (20-100 mg total) by mouth daily as needed (erection). 04/18/20   Hoyt Koch, MD  vardenafil (LEVITRA) 20 MG tablet Take 1 tablet (20 mg total) by mouth daily as needed for erectile dysfunction. 04/18/20   Hoyt Koch, MD    Current Facility-Administered Medications  Medication Dose Route Frequency Provider Last Rate Last Admin  . 0.9 %  sodium chloride infusion  10 mL/hr Intravenous Once Suella Broad A, PA-C      . sodium chloride 0.9 % bolus 1,000 mL  1,000 mL Intravenous Once Tacy Learn, PA-C       Current Outpatient Medications  Medication Sig Dispense Refill  . aspirin 325 MG tablet Take 325 mg by mouth daily.    . betamethasone valerate ointment (VALISONE) 0.1 % Apply 1 application topically 2 (two) times daily. 455 g 0  . DULoxetine (CYMBALTA) 30 MG capsule Take 30 mg by mouth daily.     . propranolol (INDERAL) 20 MG tablet Take 1 tablet (20 mg total) by mouth 2 (two) times daily. 60 tablet 6  . sildenafil (REVATIO) 20 MG tablet Take 1-5 tablets (20-100 mg total) by mouth daily as needed (erection). 100 tablet 11  . vardenafil (LEVITRA) 20 MG tablet Take 1 tablet (20 mg total) by mouth daily as needed for erectile dysfunction. 10 tablet 11    Allergies as of 09/04/2020  . (No Known Allergies)    Family History  Problem Relation Age of Onset  . Cancer Mother        Pancreas CA  . Pancreatic  cancer Mother   . Cancer Father        Brain CA  . Brain cancer Father   . Seizures Father        grand mal  . Colon cancer Neg Hx   . Esophageal cancer Neg Hx   . Rectal cancer Neg Hx   . Colon polyps Neg Hx   . Stomach cancer Neg Hx     Social History   Socioeconomic History  . Marital status: Single    Spouse name: Not on file  . Number of children: 0  . Years of education: Not on file  . Highest education level: Not on file  Occupational History  . Occupation: classical Facilities manager  Tobacco  Use  . Smoking status: Never Smoker  . Smokeless tobacco: Never Used  Vaping Use  . Vaping Use: Never used  Substance and Sexual Activity  . Alcohol use: Yes    Alcohol/week: 0.0 standard drinks    Comment: mod- rare per pt 09-10-2019  . Drug use: Never  . Sexual activity: Not Currently  Other Topics Concern  . Not on file  Social History Narrative  . Not on file   Social Determinants of Health   Financial Resource Strain: Not on file  Food Insecurity: Not on file  Transportation Needs: Not on file  Physical Activity: Not on file  Stress: Not on file  Social Connections: Not on file  Intimate Partner Violence: Not on file    Review of Systems: Gen: Denies fever, sweats or chills. No weight loss.  CV: Denies chest pain, palpitations or edema. Resp: Denies cough, shortness of breath of hemoptysis.  GI: See HPI. GU : Denies urinary burning, blood in urine, increased urinary frequency or incontinence. MS: Denies joint pain, muscles aches or weakness. Derm: Denies rash, itchiness, skin lesions or unhealing ulcers. Psych: + Anxiety.  Heme: Denies easy bruising, bleeding. Neuro:  Denies headaches, dizziness or paresthesias. Endo:  Denies any problems with DM, thyroid or adrenal function.  Physical Exam: Vital signs in last 24 hours: Temp:  [98.2 F (36.8 C)] 98.2 F (36.8 C) (04/10 1128) Pulse Rate:  [53-95] 95 (04/10 1200) Resp:  [16-20] 18 (04/10 1200) BP:  (99-112)/(58-81) 112/81 (04/10 1200) SpO2:  [97 %-100 %] 100 % (04/10 1200)   General:  Alert,  well-developed, well-nourished, pleasant and cooperative in NAD. Head:  Normocephalic and atraumatic. Eyes:  No scleral icterus. Conjunctiva pink. Ears:  Normal auditory acuity. Nose:  No deformity, discharge or lesions. Mouth:  Dentition intact. No ulcers or lesions.  Neck:  Supple. No lymphadenopathy or thyromegaly.  Lungs: Breath sounds clear throughout. Heart: Regular rate and rhythm, no murmurs. Abdomen: Soft, nondistended.  Nontender.  No masses.  Positive bowel sounds to all 4 quadrants. Rectal: Deferred.  FOBT positive.  Patient showed me photographs of black stool on the tissue and in the toilet bowl. Musculoskeletal:  Symmetrical without gross deformities.  Pulses:  Normal pulses noted. Extremities:  Without clubbing or edema. Neurologic:  Alert and  oriented x4. No focal deficits.  Skin:  Intact without significant lesions or rashes. Psych:  Alert and cooperative. Normal mood and affect.  Intake/Output from previous day: No intake/output data recorded. Intake/Output this shift: No intake/output data recorded.  Lab Results: Recent Labs    09/04/20 1138  WBC 6.1  HGB 8.4*  HCT 25.5*  PLT 237   BMET Recent Labs    09/04/20 1138  NA 137  K 3.8  CL 107  CO2 23  GLUCOSE 118*  BUN 38*  CREATININE 0.92  CALCIUM 8.5*   LFT Recent Labs    09/04/20 1138  PROT 5.0*  ALBUMIN 3.1*  AST 21  ALT 18  ALKPHOS 39  BILITOT 0.8   PT/INR No results for input(s): LABPROT, INR in the last 72 hours. Hepatitis Panel No results for input(s): HEPBSAG, HCVAB, HEPAIGM, HEPBIGM in the last 72 hours.    Studies/Results: No results found.  IMPRESSION/PLAN:  5.  65 year old male with upper GI bleed/melena and symptomatic acute blood loss anemia. On ASA 325mg  daily for afib. Hg 8.4 ( baseline Hg 15 -16). BUN 38.  -NPO -EGD benefits and risks discussed including risk with  sedation, risk of bleeding,  perforation and infection, Dr. Loletha Carrow to verify timing. -Pantoprazole 8mg /hr infusion  -Check H/H post transfusion  -IV fluids per the hospitalist  -Further recommendations per Dr. Loletha Carrow  2.  History of esophageal narrowing status post esophageal dilatation x 2, last EGD with esophageal dilatation was in 2017  3.  History of colon polyp per colonoscopy 01/20/2020  4.  History of atrial fibrillation on ASA 325 mg daily   Noralyn Pick  09/04/2020, 1:05 PM  I have reviewed the entire case in detail with the above APP and discussed the plan in detail.  Therefore, I agree with the diagnoses recorded above. In addition,  I have personally interviewed and examined the patient.  My additional thoughts are as follows:  Several days of melena and today symptomatic with lightheadedness. Daily aspirin, suspect PUD, less likely AVM.  No red flag symptoms to suggest malignancy.  Hemodynamically stable.  He is receiving PRBC transfusion, and when I saw him in the ED about 90 minutes ago, he was just about to start pantoprazole bolus and drip.  He is alert and conversational and in no distress.  Blood pressure 100/60, pulse 80s to 90s in A. fib, abdomen exam benign.  Plan is for upper endoscopy tomorrow morning with Dr.Pyrtle.  Procedure described in detail along with risks and benefits and he was agreeable.  The benefits and risks of the planned procedure were described in detail with the patient or (when appropriate) their health care proxy.  Risks were outlined as including, but not limited to, bleeding, infection, perforation, adverse medication reaction leading to cardiac or pulmonary decompensation, pancreatitis (if ERCP).  The limitation of incomplete mucosal visualization was also discussed.  No guarantees or warranties were given.  Patient at increased risk for cardiopulmonary complications of procedure due to medical comorbidities.  Please call overnight  on-call physician if patient shows signs of brisk GI bleeding and hemodynamic instability.   Nelida Meuse III Office:[phone]

## 2020-09-05 ENCOUNTER — Encounter (HOSPITAL_COMMUNITY)
Admission: EM | Disposition: A | Discharge status: Home / Self Care | Payer: Self-pay | Source: Home / Self Care | Attending: Internal Medicine

## 2020-09-05 ENCOUNTER — Inpatient Hospital Stay (HOSPITAL_COMMUNITY): Payer: 59 | Admitting: Anesthesiology

## 2020-09-05 ENCOUNTER — Encounter (HOSPITAL_COMMUNITY): Payer: Self-pay | Admitting: Family Medicine

## 2020-09-05 DIAGNOSIS — K222 Esophageal obstruction: Secondary | ICD-10-CM

## 2020-09-05 DIAGNOSIS — K921 Melena: Principal | ICD-10-CM

## 2020-09-05 HISTORY — PX: ESOPHAGOGASTRODUODENOSCOPY (EGD) WITH PROPOFOL: SHX5813

## 2020-09-05 HISTORY — PX: GIVENS CAPSULE STUDY: SHX5432

## 2020-09-05 LAB — CBC
HCT: 24.3 % — ABNORMAL LOW (ref 39.0–52.0)
Hemoglobin: 8.3 g/dL — ABNORMAL LOW (ref 13.0–17.0)
MCH: 31.8 pg (ref 26.0–34.0)
MCHC: 34.2 g/dL (ref 30.0–36.0)
MCV: 93.1 fL (ref 80.0–100.0)
Platelets: 223 10*3/uL (ref 150–400)
RBC: 2.61 MIL/uL — ABNORMAL LOW (ref 4.22–5.81)
RDW: 15 % (ref 11.5–15.5)
WBC: 7.5 10*3/uL (ref 4.0–10.5)
nRBC: 0 % (ref 0.0–0.2)

## 2020-09-05 LAB — TYPE AND SCREEN
ABO/RH(D): A NEG
Antibody Screen: NEGATIVE
Unit division: 0

## 2020-09-05 LAB — BPAM RBC
Blood Product Expiration Date: 202205062359
ISSUE DATE / TIME: 202204101303
Unit Type and Rh: 600

## 2020-09-05 LAB — BASIC METABOLIC PANEL
Anion gap: 3 — ABNORMAL LOW (ref 5–15)
BUN: 33 mg/dL — ABNORMAL HIGH (ref 8–23)
CO2: 24 mmol/L (ref 22–32)
Calcium: 8.1 mg/dL — ABNORMAL LOW (ref 8.9–10.3)
Chloride: 111 mmol/L (ref 98–111)
Creatinine, Ser: 0.97 mg/dL (ref 0.61–1.24)
GFR, Estimated: >60 mL/min (ref >60)
Glucose, Bld: 110 mg/dL — ABNORMAL HIGH (ref 70–99)
Potassium: 3.5 mmol/L (ref 3.5–5.1)
Sodium: 138 mmol/L (ref 135–145)

## 2020-09-05 LAB — HEMOGLOBIN AND HEMATOCRIT, BLOOD
HCT: 25.6 % — ABNORMAL LOW (ref 39.0–52.0)
Hemoglobin: 8.5 g/dL — ABNORMAL LOW (ref 13.0–17.0)

## 2020-09-05 SURGERY — ESOPHAGOGASTRODUODENOSCOPY (EGD) WITH PROPOFOL
Anesthesia: Monitor Anesthesia Care

## 2020-09-05 MED ORDER — PROPOFOL 10 MG/ML IV BOLUS
INTRAVENOUS | Status: DC | PRN
  Administered 2020-09-05: 20 mg via INTRAVENOUS
  Administered 2020-09-05: 30 mg via INTRAVENOUS

## 2020-09-05 MED ORDER — PHENYLEPHRINE 40 MCG/ML (10ML) SYRINGE FOR IV PUSH (FOR BLOOD PRESSURE SUPPORT)
PREFILLED_SYRINGE | INTRAVENOUS | Status: DC | PRN
  Administered 2020-09-05 (×3): 80 ug via INTRAVENOUS
  Administered 2020-09-05: 160 ug via INTRAVENOUS
  Administered 2020-09-05: 120 ug via INTRAVENOUS
  Administered 2020-09-05: 160 ug via INTRAVENOUS

## 2020-09-05 MED ORDER — PANTOPRAZOLE SODIUM 40 MG PO TBEC
40.0000 mg | DELAYED_RELEASE_TABLET | Freq: Every day | ORAL | Status: DC
  Administered 2020-09-05 – 2020-09-11 (×5): 40 mg via ORAL
  Filled 2020-09-05 (×6): qty 1

## 2020-09-05 MED ORDER — ACETAMINOPHEN 325 MG PO TABS: 650.0000 mg | ORAL_TABLET | Freq: Four times a day (QID) | ORAL | Status: DC | PRN

## 2020-09-05 MED ORDER — LACTATED RINGERS IV SOLN
INTRAVENOUS | Status: AC | PRN
  Administered 2020-09-05: 1000 mL via INTRAVENOUS

## 2020-09-05 MED ORDER — HYDROXYZINE HCL 25 MG PO TABS
25.0000 mg | ORAL_TABLET | Freq: Once | ORAL | Status: AC
  Administered 2020-09-05: 25 mg via ORAL
  Filled 2020-09-05: qty 1

## 2020-09-05 MED ORDER — POLYVINYL ALCOHOL 1.4 % OP SOLN
1.0000 [drp] | OPHTHALMIC | Status: DC | PRN
  Administered 2020-09-05: 1 [drp] via OPHTHALMIC
  Filled 2020-09-05: qty 15

## 2020-09-05 MED ORDER — PHENYLEPHRINE 40 MCG/ML (10ML) SYRINGE FOR IV PUSH (FOR BLOOD PRESSURE SUPPORT): PREFILLED_SYRINGE | INTRAVENOUS | Status: DC | PRN

## 2020-09-05 MED ORDER — PROPOFOL 500 MG/50ML IV EMUL
INTRAVENOUS | Status: DC | PRN
  Administered 2020-09-05: 100 ug/kg/min via INTRAVENOUS

## 2020-09-05 MED ORDER — TRAZODONE HCL 50 MG PO TABS
50.0000 mg | ORAL_TABLET | Freq: Every day | ORAL | Status: DC
  Administered 2020-09-05 – 2020-09-08 (×3): 50 mg via ORAL
  Filled 2020-09-05 (×6): qty 1

## 2020-09-05 SURGICAL SUPPLY — 15 items

## 2020-09-05 NOTE — Progress Notes (Signed)
PROGRESS NOTE    Jose Holden  FTD:[id number] DOB: 12-16-1955 DOA: 09/04/2020 PCP: Hoyt Koch, MD   Chief Complain: Weakness/dizziness  Brief Narrative: Patient is a 65 year old male with history of esophageal stenosis, A. fib on aspirin who presented with complaints of weakness, dizziness, 3 to 4 days history of melanotic stool.  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  History of several EGDs in the past for esophageal dilation for stenosis.  On presentation his hemoglobin was found to be in the range of 8, previous hemoglobin was in the range of 16.  Transfused with 2 units of PRBC on presentation, GI consulted, underwent EGD today without finding of clear source of bleeding.  Underwent video capsule study today..  Assessment & Plan:   Principal Problem:   Melena Active Problems:   Atrial fibrillation, chronic (HCC)   Esophageal stenosis   Anemia associated with acute blood loss   GIB (gastrointestinal bleeding)   Upper GI bleed/melena: Currently hemoglobin stable in the range of 8.  Baseline hemoglobin around 16.  GI consulted,  underwent EGD today without finding of clear source of bleeding.  Undergoing video capsule study today..  Takes aspirin at home which is on hold  Acute blood loss normocytic anemia: Secondary to upper GI bleed.  Transfused with 2 units of PRBC.  Monitor H&H.  Paroxysmal A. fib: Currently rate is controlled.  On aspirin at baseline.  EKG showed normal sinus rhythm, PVCs  History of esophageal stenosis: Currently stable        DVT prophylaxis:SCD Code Status: Full Family Communication: None Status is: Inpatient  Remains inpatient appropriate because:Inpatient level of care appropriate due to severity of illness   Dispo: The patient is from: Home              Anticipated d/c is to: Home              Patient currently is not medically stable to d/c.   Difficult to place patient  No    Consultants: GI  Procedures:None  Antimicrobials:  Anti-infectives (From admission, onward)   None      Subjective: Patient seen and examined at the bedside this morning after the endoscopy.  Denies any complaints.  No new episode of melena after admission.  Comfortable  Objective: Vitals:   09/04/20 2028 09/04/20 2100 09/05/20 0600 09/05/20 0744  BP: (!) 80/54 (!) 105/55 106/63 (!) 110/54  Pulse: 96  90 81  Resp: 16 16 18 14   Temp: 98.2 F (36.8 C)  98.3 F (36.8 C) 97.9 F (36.6 C)  TempSrc: Oral  Oral Temporal  SpO2: 99%  96% 99%  Weight:      Height:        Intake/Output Summary (Last 24 hours) at 09/05/2020 0758 Last data filed at 09/04/2020 1647 Gross per 24 hour  Intake 2194.1 ml  Output --  Net 2194.1 ml   Filed Weights   09/04/20 1644  Weight: 89 kg    Examination:  General exam: Appears calm and comfortable ,Not in distress,average built HEENT:PERRL,Oral mucosa moist, Ear/Nose normal on gross exam Respiratory system: Bilateral equal air entry, normal vesicular breath sounds, no wheezes or crackles  Cardiovascular system: S1 & S2 heard, RRR. No JVD, murmurs, rubs, gallops or clicks. No pedal edema. Gastrointestinal system: Abdomen is nondistended, soft and nontender. No organomegaly or masses felt. Normal bowel sounds heard. Central nervous system: Alert and oriented. No focal neurological deficits.  Extremities: No edema, no clubbing ,no cyanosis Skin: No rashes, lesions or ulcers,no icterus ,no pallor    Data Reviewed: I have personally reviewed following labs and imaging studies  CBC: Recent Labs  Lab 09/04/20 1138 09/04/20 1839 09/05/20 0241  WBC 6.1  --  7.5  HGB 8.4* 7.9* 8.3*  HCT 25.5* 23.3* 24.3*  MCV 97.3  --  93.1  PLT 237  --  329   Basic Metabolic Panel: Recent Labs  Lab 09/04/20 1138 09/05/20 0241  NA 137 138  K 3.8 3.5  CL 107 111  CO2 23 24  GLUCOSE 118* 110*  BUN 38* 33*  CREATININE 0.92 0.97  CALCIUM  8.5* 8.1*   GFR: Estimated Creatinine Clearance: 98.1 mL/min (by C-G formula based on SCr of 0.97 mg/dL). Liver Function Tests: Recent Labs  Lab 09/04/20 1138  AST 21  ALT 18  ALKPHOS 39  BILITOT 0.8  PROT 5.0*  ALBUMIN 3.1*   No results for input(s): LIPASE, AMYLASE in the last 168 hours. No results for input(s): AMMONIA in the last 168 hours. Coagulation Profile: No results for input(s): INR, PROTIME in the last 168 hours. Cardiac Enzymes: No results for input(s): CKTOTAL, CKMB, CKMBINDEX, TROPONINI in the last 168 hours. BNP (last 3 results) No results for input(s): PROBNP in the last 8760 hours. HbA1C: No results for input(s): HGBA1C in the last 72 hours. CBG: No results for input(s): GLUCAP in the last 168 hours. Lipid Profile: No results for input(s): CHOL, HDL, LDLCALC, TRIG, CHOLHDL, LDLDIRECT in the last 72 hours. Thyroid Function Tests: No results for input(s): TSH, T4TOTAL, FREET4, T3FREE, THYROIDAB in the last 72 hours. Anemia Panel: No results for input(s): VITAMINB12, FOLATE, FERRITIN, TIBC, IRON, RETICCTPCT in the last 72 hours. Sepsis Labs: No results for input(s): PROCALCITON, LATICACIDVEN in the last 168 hours.  Recent Results (from the past 240 hour(s))  Resp Panel by RT-PCR (Flu A&B, Covid) Nasopharyngeal Swab     Status: None   Collection Time: 09/04/20 12:24 PM   Specimen: Nasopharyngeal Swab; Nasopharyngeal(NP) swabs in vial transport medium  Result Value Ref Range Status   SARS Coronavirus 2 by RT PCR NEGATIVE NEGATIVE Final    Comment: (NOTE) SARS-CoV-2 target nucleic acids are NOT DETECTED.  The SARS-CoV-2 RNA is generally detectable in upper respiratory specimens during the acute phase of infection. The lowest concentration of SARS-CoV-2 viral copies this assay can detect is 138 copies/mL. A negative result does not preclude SARS-Cov-2 infection and should not be used as the sole basis for treatment or other patient management decisions. A  negative result may occur with  improper specimen collection/handling, submission of specimen other than nasopharyngeal swab, presence of viral mutation(s) within the areas targeted by this assay, and inadequate number of viral copies(<138 copies/mL). A negative result must be combined with clinical observations, patient history, and epidemiological information. The expected result is Negative.  Fact Sheet for Patients:  EntrepreneurPulse.com.au  Fact Sheet for Healthcare Providers:  IncredibleEmployment.be  This test is no t yet approved or cleared by the [place] FDA and  has been authorized for detection and/or diagnosis of SARS-CoV-2 by FDA under an Emergency Use Authorization (EUA). This EUA will remain  in effect (meaning this test can be used) for the duration of the COVID-19 declaration under Section 564(b)(1) of the Act, 21 U.S.C.section 360bbb-3(b)(1), unless the authorization is terminated  or revoked sooner.       Influenza A by PCR NEGATIVE NEGATIVE Final   Influenza B by  PCR NEGATIVE NEGATIVE Final    Comment: (NOTE) The Xpert Xpress SARS-CoV-2/FLU/RSV plus assay is intended as an aid in the diagnosis of influenza from Nasopharyngeal swab specimens and should not be used as a sole basis for treatment. Nasal washings and aspirates are unacceptable for Xpert Xpress SARS-CoV-2/FLU/RSV testing.  Fact Sheet for Patients: EntrepreneurPulse.com.au  Fact Sheet for Healthcare Providers: IncredibleEmployment.be  This test is not yet approved or cleared by the [place] FDA and has been authorized for detection and/or diagnosis of SARS-CoV-2 by FDA under an Emergency Use Authorization (EUA). This EUA will remain in effect (meaning this test can be used) for the duration of the COVID-19 declaration under Section 564(b)(1) of the Act, 21 U.S.C. section 360bbb-3(b)(1), unless the authorization  is terminated or revoked.  Performed at [facility], [place] [address] [place], [place] [address]          Radiology Studies: No results found.      Scheduled Meds: . [MAR Hold] pantoprazole  40 mg Intravenous Q12H  . [MAR Hold] sodium chloride flush  3 mL Intravenous Q12H   Continuous Infusions: . [MAR Hold] sodium chloride    . lactated ringers 1,000 mL (09/05/20 0747)  . pantoprozole (PROTONIX) infusion 8 mg/hr (09/05/20 0136)     LOS: 1 day    Time spent: 25 mins.More than 50% of that time was spent in counseling and/or coordination of care.      Shelly Coss, MD [facility] P4/03/2021, 7:58 AM

## 2020-09-05 NOTE — Progress Notes (Signed)
Pt ingested pill cam at 0945.  Instructions given to patient and bedside RN.  Vista Lawman, RN

## 2020-09-05 NOTE — Interval H&P Note (Signed)
History and Physical Interval Note: Pt mentioned intermittent dysphagia and requests repeat dilation is appropriate.  I told him his esophagus and stricture would be evaluated today but depending on the findings and needed therapy this may or may not be possible.  He voiced understanding.  09/05/2020 8:15 AM  Jose Holden  has presented today for surgery, with the diagnosis of Upper GI bleed, melena, anemia.  The various methods of treatment have been discussed with the patient and family. After consideration of risks, benefits and other options for treatment, the patient has consented to  Procedure(s): ESOPHAGOGASTRODUODENOSCOPY (EGD) WITH PROPOFOL (N/A) as a surgical intervention.  The patient's history has been reviewed, patient examined, no change in status, stable for surgery.  I have reviewed the patient's chart and labs.  Questions were answered to the patient's satisfaction.     Lajuan Lines Sumedh Shinsato

## 2020-09-05 NOTE — Transfer of Care (Signed)
Immediate Anesthesia Transfer of Care Note  Patient: Jose Holden  Procedure(s) Performed: ESOPHAGOGASTRODUODENOSCOPY (EGD) WITH PROPOFOL (N/A )  Patient Location: PACU  Anesthesia Type:MAC  Level of Consciousness: oriented, drowsy and patient cooperative  Airway & Oxygen Therapy: Patient Spontanous Breathing and Patient connected to nasal cannula oxygen  Post-op Assessment: Report given to RN and Post -op Vital signs reviewed and stable  Post vital signs: Reviewed  Last Vitals:  Vitals Value Taken Time  BP 90/53 09/05/20 0906  Temp 36.5 C 09/05/20 0904  Pulse 79 09/05/20 0909  Resp 20 09/05/20 0909  SpO2 100 % 09/05/20 0909  Vitals shown include unvalidated device data.  Last Pain:  Vitals:   09/05/20 0904  TempSrc: Temporal  PainSc: 0-No pain         Complications: No complications documented.

## 2020-09-05 NOTE — Anesthesia Procedure Notes (Signed)
Procedure Name: MAC Date/Time: 09/05/2020 8:25 AM Performed by: Jenne Campus, CRNA Pre-anesthesia Checklist: Patient identified, Emergency Drugs available, Suction available, Patient being monitored and Timeout performed Oxygen Delivery Method: Nasal cannula

## 2020-09-05 NOTE — Anesthesia Preprocedure Evaluation (Addendum)
Anesthesia Evaluation  Patient identified by MRN, date of birth, ID band Patient awake    Reviewed: Allergy & Precautions, NPO status , Patient's Chart, lab work & pertinent test results  Airway Mallampati: I  TM Distance: >3 FB Neck ROM: Full    Dental  (+) Teeth Intact, Dental Advisory Given   Pulmonary neg pulmonary ROS,    Pulmonary exam normal        Cardiovascular + dysrhythmias Atrial Fibrillation  Rhythm:Irregular Rate:Normal     Neuro/Psych Anxiety negative neurological ROS     GI/Hepatic negative GI ROS, Neg liver ROS,   Endo/Other  negative endocrine ROS  Renal/GU negative Renal ROS     Musculoskeletal negative musculoskeletal ROS (+)   Abdominal Normal abdominal exam  (+)   Peds  Hematology negative hematology ROS (+)   Anesthesia Other Findings   Reproductive/Obstetrics                            Anesthesia Physical Anesthesia Plan  ASA: III  Anesthesia Plan: MAC   Post-op Pain Management:    Induction: Intravenous  PONV Risk Score and Plan: 0 and Propofol infusion  Airway Management Planned: Natural Airway and Simple Face Mask  Additional Equipment: None  Intra-op Plan:   Post-operative Plan:   Informed Consent: I have reviewed the patients History and Physical, chart, labs and discussed the procedure including the risks, benefits and alternatives for the proposed anesthesia with the patient or authorized representative who has indicated his/her understanding and acceptance.       Plan Discussed with: CRNA  Anesthesia Plan Comments:        Anesthesia Quick Evaluation

## 2020-09-05 NOTE — Interval H&P Note (Signed)
History and Physical Interval Note: For EGD today to eval melena and acute anemia. The nature of the procedure, as well as the risks, benefits, and alternatives were carefully and thoroughly reviewed with the patient. Ample time for discussion and questions allowed. The patient understood, was satisfied, and agreed to proceed.   CBC Latest Ref Rng & Units 09/05/2020 09/04/2020 09/04/2020  WBC 4.0 - 10.5 K/uL 7.5 - 6.1  Hemoglobin 13.0 - 17.0 g/dL 8.3(L) 7.9(L) 8.4(L)  Hematocrit 39.0 - 52.0 % 24.3(L) 23.3(L) 25.5(L)  Platelets 150 - 400 K/uL 223 - 237     09/05/2020 8:06 AM  Jose Holden  has presented today for surgery, with the diagnosis of Upper GI bleed, melena, anemia.  The various methods of treatment have been discussed with the patient and family. After consideration of risks, benefits and other options for treatment, the patient has consented to  Procedure(s): ESOPHAGOGASTRODUODENOSCOPY (EGD) WITH PROPOFOL (N/A) as a surgical intervention.  The patient's history has been reviewed, patient examined, no change in status, stable for surgery.  I have reviewed the patient's chart and labs.  Questions were answered to the patient's satisfaction.     Lajuan Lines Christo Hain

## 2020-09-05 NOTE — Anesthesia Postprocedure Evaluation (Signed)
Anesthesia Post Note  Patient: Jose Holden  Procedure(s) Performed: ESOPHAGOGASTRODUODENOSCOPY (EGD) WITH PROPOFOL (N/A ) GIVENS CAPSULE STUDY (N/A )     Patient location during evaluation: PACU Anesthesia Type: MAC Level of consciousness: awake and alert Pain management: pain level controlled Vital Signs Assessment: post-procedure vital signs reviewed and stable Respiratory status: spontaneous breathing, nonlabored ventilation, respiratory function stable and patient connected to nasal cannula oxygen Cardiovascular status: stable and blood pressure returned to baseline Postop Assessment: no apparent nausea or vomiting Anesthetic complications: no   No complications documented.  Last Vitals:  Vitals:   09/05/20 0935 09/05/20 1048  BP: 100/62 94/71  Pulse: 78 71  Resp: (!) 22 18  Temp:  (!) 36.4 C  SpO2: 96% 100%    Last Pain:  Vitals:   09/05/20 1048  TempSrc: Oral  PainSc:                  [name]

## 2020-09-05 NOTE — Op Note (Signed)
[facility] Patient Name: Jose Holden Procedure Date : 09/05/2020 MRN: [id number] Attending MD: Jerene Bears , MD Date of Birth: 1956-04-15 CSN: [id number] Age: 65 Admit Type: Inpatient Procedure:                Upper GI endoscopy Indications:              Acute post hemorrhagic anemia, Melena Providers:                Lajuan Lines. Hilarie Fredrickson, MD, Particia Nearing, RN, Elspeth Cho                            Tech., Technician, Luciana Axe, CRNA Referring MD:             [facility] Medicines:                Monitored Anesthesia Care Complications:            No immediate complications. Estimated Blood Loss:     Estimated blood loss: none. Procedure:                Pre-Anesthesia Assessment:                           - Prior to the procedure, a History and Physical                            was performed, and patient medications and                            allergies were reviewed. The patient's tolerance of                            previous anesthesia was also reviewed. The risks                            and benefits of the procedure and the sedation                            options and risks were discussed with the patient.                            All questions were answered, and informed consent                            was obtained. Prior Anticoagulants: The patient has                            taken no previous anticoagulant or antiplatelet                            agents. ASA Grade Assessment: II - A patient with                            mild systemic disease. After reviewing the risks  and benefits, the patient was deemed in                            satisfactory condition to undergo the procedure.                           After obtaining informed consent, the endoscope was                            passed under direct vision. Throughout the                            procedure, the patient's blood pressure,  pulse, and                            oxygen saturations were monitored continuously. The                            GIF-H190 ([id number]) Olympus gastroscope was                            introduced through the mouth, and advanced to the                            second part of duodenum. Due to a longer than                            average esophagus and gastric looping the adult                            upper endoscope would not easily advance to D2,                            thus the scope was exchanged for the ultraslim                            colonoscope and this scope was advanced well into                            D2/proximal D3. The upper GI endoscopy was                            accomplished without difficulty. The patient                            tolerated the procedure well. Scope In: Scope Out: Findings:      Normal mucosa was found in the entire esophagus. Z-line is regular at 48       cm.      A non-obstructing and partial Schatzki's ring was found at the       gastroesophageal junction.      The gastroesophageal flap valve was visualized endoscopically and       classified as Hill Grade II (fold present, opens with respiration).  The entire examined stomach was normal.      The examined duodenum was normal. Impression:               - Normal mucosa was found in the entire esophagus.                           - Non-obstructing and partial Schatzki's ring.                           - Normal stomach.                           - Normal examined duodenum.                           - No specimens collected. Moderate Sedation:      N/A Recommendation:           - Patient has a contact number available for                            emergencies. The signs and symptoms of potential                            delayed complications were discussed with the                            patient. Return to normal activities tomorrow.                            Written  discharge instructions were provided to the                            patient.                           - Clear liquid diet.                           - Continue present medications.                           - Given melena, significant acute anemia,                            colonoscopy in the past 8 months, and possible need                            to [facility] in setting of atrial fibrillation my                            recommendation is to proceed with VCE to exam the                            remaining small bowel for source of recent GI  bleeding. Will discuss further with the patient.                           - Closely monitor Hgb. Procedure Code(s):        --- Professional ---                           [phone], Esophagogastroduodenoscopy, flexible,                            transoral; diagnostic, including collection of                            specimen(s) by brushing or washing, when performed                            (separate procedure) Diagnosis Code(s):        --- Professional ---                           K22.2, Esophageal obstruction                           D62, Acute posthemorrhagic anemia                           K92.1, Melena (includes Hematochezia) CPT copyright 2019 American Medical Association. All rights reserved. The codes documented in this report are preliminary and upon coder review may  be revised to meet current compliance requirements. Jerene Bears, MD 09/05/2020 9:02:46 AM This report has been signed electronically. Number of Addenda: 0

## 2020-09-06 ENCOUNTER — Encounter (HOSPITAL_COMMUNITY): Payer: Self-pay | Admitting: Internal Medicine

## 2020-09-06 LAB — CBC WITH DIFFERENTIAL/PLATELET
Abs Immature Granulocytes: 0.02 10*3/uL (ref 0.00–0.07)
Basophils Absolute: 0 10*3/uL (ref 0.0–0.1)
Basophils Relative: 1 %
Eosinophils Absolute: 0.2 10*3/uL (ref 0.0–0.5)
Eosinophils Relative: 3 %
HCT: 21.3 % — ABNORMAL LOW (ref 39.0–52.0)
Hemoglobin: 7.2 g/dL — ABNORMAL LOW (ref 13.0–17.0)
Immature Granulocytes: 0 %
Lymphocytes Relative: 38 %
Lymphs Abs: 2 10*3/uL (ref 0.7–4.0)
MCH: 32 pg (ref 26.0–34.0)
MCHC: 33.8 g/dL (ref 30.0–36.0)
MCV: 94.7 fL (ref 80.0–100.0)
Monocytes Absolute: 0.5 10*3/uL (ref 0.1–1.0)
Monocytes Relative: 9 %
Neutro Abs: 2.7 10*3/uL (ref 1.7–7.7)
Neutrophils Relative %: 49 %
Platelets: 203 10*3/uL (ref 150–400)
RBC: 2.25 MIL/uL — ABNORMAL LOW (ref 4.22–5.81)
RDW: 14.7 % (ref 11.5–15.5)
WBC: 5.4 10*3/uL (ref 4.0–10.5)
nRBC: 0 % (ref 0.0–0.2)

## 2020-09-06 LAB — HEMOGLOBIN AND HEMATOCRIT, BLOOD
HCT: 24.1 % — ABNORMAL LOW (ref 39.0–52.0)
HCT: 21.9 % — ABNORMAL LOW (ref 39.0–52.0)
Hemoglobin: 8.2 g/dL — ABNORMAL LOW (ref 13.0–17.0)
Hemoglobin: 7.5 g/dL — ABNORMAL LOW (ref 13.0–17.0)

## 2020-09-06 MED ORDER — PEG-KCL-NACL-NASULF-NA ASC-C 100 G PO SOLR
0.5000 | Freq: Once | ORAL | Status: AC
  Administered 2020-09-06: 100 g via ORAL
  Filled 2020-09-06: qty 1

## 2020-09-06 MED ORDER — PEG-KCL-NACL-NASULF-NA ASC-C 100 G PO SOLR: 1.0000 | Freq: Once | ORAL | Status: DC

## 2020-09-06 MED ORDER — PEG-KCL-NACL-NASULF-NA ASC-C 100 G PO SOLR
0.5000 | Freq: Once | ORAL | Status: AC
  Administered 2020-09-07: 100 g via ORAL
  Filled 2020-09-06: qty 1

## 2020-09-06 NOTE — Progress Notes (Signed)
Patient ID: Jose Holden, male   DOB: 11/12/1955, 65 y.o.   MRN: [id number]    Progress Note   Subjective   Day # 3 CC; GI bleed/melena  EGD yesterday normal Capsule endoscopy interpreted today-negative, no blood in the small bowel and no findings to explain bleeding.  Melena noted in the cecum and throughout the remainder of the colon  Patient says he feels okay he has not had any bowel movements since very early a.m., dark stool Last hemoglobin 7.2 this a.m.   Objective   Vital signs in last 24 hours: Temp:  [97.8 F (36.6 C)] 97.8 F (36.6 C) (04/12 0828) Pulse Rate:  [65-84] 65 (04/12 0828) Resp:  [14-17] 17 (04/12 0828) BP: (90-110)/(62-71) 90/62 (04/12 0828) SpO2:  [99 %-100 %] 99 % (04/12 0828) Last BM Date: 09/06/20 General:    white male in NAD Heart:  Regular rate and rhythm; no murmurs Lungs: Respirations even and unlabored, lungs CTA bilaterally Abdomen:  Soft, nontender and nondistended. Normal bowel sounds. Extremities:  Without edema. Neurologic:  Alert and oriented,  grossly normal neurologically. Psych:  Cooperative. Normal mood and affect.  Intake/Output from previous day: 04/11 0701 - 04/12 0700 In: 300 [I.V.:300] Out: -  Intake/Output this shift: No intake/output data recorded.  Lab Results: Recent Labs    09/04/20 1138 09/04/20 1839 09/05/20 0241 09/05/20 1818 09/06/20 0248  WBC 6.1  --  7.5  --  5.4  HGB 8.4*   < > 8.3* 8.5* 7.2*  HCT 25.5*   < > 24.3* 25.6* 21.3*  PLT 237  --  223  --  203   < > = values in this interval not displayed.   BMET Recent Labs    09/04/20 1138 09/05/20 0241  NA 137 138  K 3.8 3.5  CL 107 111  CO2 23 24  GLUCOSE 118* 110*  BUN 38* 33*  CREATININE 0.92 0.97  CALCIUM 8.5* 8.1*   LFT Recent Labs    09/04/20 1138  PROT 5.0*  ALBUMIN 3.1*  AST 21  ALT 18  ALKPHOS 39  BILITOT 0.8   PT/INR No results for input(s): LABPROT, INR in the last 72 hours.  Studies/Results: No results  found.     Assessment / Plan:    #92 65 year old white male with acute GI bleed, of unclear etiology Capsule endoscopy negative for any findings in the small bowel, melena noted in the cecum and throughout the remainder of the visualized colon  Suspect source of bleed may be from the colon  Plan; Clear liquid diet, n.p.o. after midnight Repeat hemoglobin now and every 8 hours and plan to transfuse for hemoglobin less than 7  Bowel prep this evening Patient has been scheduled for colonoscopy with Dr. Hilarie Fredrickson early tomorrow afternoon. Capsule endoscopy discussed with the patient in detail, as well as colonoscopy and patient is agreeable to proceed    Principal Problem:   Melena Active Problems:   Atrial fibrillation, chronic (HCC)   Esophageal stenosis   Anemia associated with acute blood loss   GIB (gastrointestinal bleeding)     LOS: 2 days   Jacey Pelc PA-C 09/06/2020, 2:29 PM

## 2020-09-06 NOTE — Progress Notes (Signed)
          Daily Rounding Note  09/06/2020, 12:28 PM  LOS: 2 days   SUBJECTIVE:   Chief complaint: GI bleed.  Anemia.     2 tarry stools this morning.  Tolerating solid food.  Still feels a bit weak but overall better than he did. Asking if he will be able to restart his full dose aspirin at some point.  OBJECTIVE:         Vital signs in last 24 hours:    Temp:  [97.8 F (36.6 C)] 97.8 F (36.6 C) (04/12 0828) Pulse Rate:  [65-84] 65 (04/12 0828) Resp:  [14-17] 17 (04/12 0828) BP: (90-110)/(62-71) 90/62 (04/12 0828) SpO2:  [99 %-100 %] 99 % (04/12 0828) Last BM Date: 09/06/20 Filed Weights   09/04/20 1644  Weight: 89 kg   General: Looks well.  NAD Heart: RRR. Chest: Clear bilaterally.  No labored breathing.  No cough Abdomen: Not tender or distended.  Active bowel sounds. Extremities: No CCE. Neuro/Psych: Appropriate.  Fluid speech.  Moves all 4 limbs.  Ambulates without difficulty.  Intake/Output from previous day: 04/11 0701 - 04/12 0700 In: 300 [I.V.:300] Out: -   Intake/Output this shift: No intake/output data recorded.  Lab Results: Recent Labs    09/04/20 1138 09/04/20 1839 09/05/20 0241 09/05/20 1818 09/06/20 0248  WBC 6.1  --  7.5  --  5.4  HGB 8.4*   < > 8.3* 8.5* 7.2*  HCT 25.5*   < > 24.3* 25.6* 21.3*  PLT 237  --  223  --  203   < > = values in this interval not displayed.   BMET Recent Labs    09/04/20 1138 09/05/20 0241  NA 137 138  K 3.8 3.5  CL 107 111  CO2 23 24  GLUCOSE 118* 110*  BUN 38* 33*  CREATININE 0.92 0.97  CALCIUM 8.5* 8.1*   LFT Recent Labs    09/04/20 1138  PROT 5.0*  ALBUMIN 3.1*  AST 21  ALT 18  ALKPHOS 39  BILITOT 0.8   PT/INR No results for input(s): LABPROT, INR in the last 72 hours. Hepatitis Panel No results for input(s): HEPBSAG, HCVAB, HEPAIGM, HEPBIGM in the last 72 hours.  Studies/Results: No results found.  ASSESMENT:   *   GI bleed with  melena.  Blood loss anemia. 09/05/2020 EGD: Nonobstructing, partial Schatzki's ring.  Otherwise normal exam and esophagus.  Normal stomach and normal examined duodenum. 09/05/2020 VCE.  Capsule dropped after the EGD.  Await reading. 12/2019 colonoscopy with several polyps removed, sessile serrated type and tubular adenomas. 2 melenic stools this morning.  *    Blood loss anemia.  Hgb 7.9 >> 8.5  >> 7.2. Received 1 PRBC on 4/10.  *   Azotemia.  BUN 30 >> 33.  Creatinine normal.   PLAN   *    Await reading of VCE.  Repeat Hgb hematocrit pending for 6 PM tonight with repeat CBC in the morning.    Azucena Freed  09/06/2020, 12:28 PM Phone [phone]

## 2020-09-06 NOTE — H&P (View-Only) (Signed)
Patient ID: Jose Holden, male   DOB: 03/11/1956, 65 y.o.   MRN: [id number]    Progress Note   Subjective   Day # 3 CC; GI bleed/melena  EGD yesterday normal Capsule endoscopy interpreted today-negative, no blood in the small bowel and no findings to explain bleeding.  Melena noted in the cecum and throughout the remainder of the colon  Patient says he feels okay he has not had any bowel movements since very early a.m., dark stool Last hemoglobin 7.2 this a.m.   Objective   Vital signs in last 24 hours: Temp:  [97.8 F (36.6 C)] 97.8 F (36.6 C) (04/12 0828) Pulse Rate:  [65-84] 65 (04/12 0828) Resp:  [14-17] 17 (04/12 0828) BP: (90-110)/(62-71) 90/62 (04/12 0828) SpO2:  [99 %-100 %] 99 % (04/12 0828) Last BM Date: 09/06/20 General:    white male in NAD Heart:  Regular rate and rhythm; no murmurs Lungs: Respirations even and unlabored, lungs CTA bilaterally Abdomen:  Soft, nontender and nondistended. Normal bowel sounds. Extremities:  Without edema. Neurologic:  Alert and oriented,  grossly normal neurologically. Psych:  Cooperative. Normal mood and affect.  Intake/Output from previous day: 04/11 0701 - 04/12 0700 In: 300 [I.V.:300] Out: -  Intake/Output this shift: No intake/output data recorded.  Lab Results: Recent Labs    09/04/20 1138 09/04/20 1839 09/05/20 0241 09/05/20 1818 09/06/20 0248  WBC 6.1  --  7.5  --  5.4  HGB 8.4*   < > 8.3* 8.5* 7.2*  HCT 25.5*   < > 24.3* 25.6* 21.3*  PLT 237  --  223  --  203   < > = values in this interval not displayed.   BMET Recent Labs    09/04/20 1138 09/05/20 0241  NA 137 138  K 3.8 3.5  CL 107 111  CO2 23 24  GLUCOSE 118* 110*  BUN 38* 33*  CREATININE 0.92 0.97  CALCIUM 8.5* 8.1*   LFT Recent Labs    09/04/20 1138  PROT 5.0*  ALBUMIN 3.1*  AST 21  ALT 18  ALKPHOS 39  BILITOT 0.8   PT/INR No results for input(s): LABPROT, INR in the last 72 hours.  Studies/Results: No results  found.     Assessment / Plan:    #81 65 year old white male with acute GI bleed, of unclear etiology Capsule endoscopy negative for any findings in the small bowel, melena noted in the cecum and throughout the remainder of the visualized colon  Suspect source of bleed may be from the colon  Plan; Clear liquid diet, n.p.o. after midnight Repeat hemoglobin now and every 8 hours and plan to transfuse for hemoglobin less than 7  Bowel prep this evening Patient has been scheduled for colonoscopy with Dr. Hilarie Fredrickson early tomorrow afternoon. Capsule endoscopy discussed with the patient in detail, as well as colonoscopy and patient is agreeable to proceed    Principal Problem:   Melena Active Problems:   Atrial fibrillation, chronic (HCC)   Esophageal stenosis   Anemia associated with acute blood loss   GIB (gastrointestinal bleeding)     LOS: 2 days   Ariyana Faw PA-C 09/06/2020, 2:29 PM

## 2020-09-06 NOTE — Progress Notes (Signed)
PROGRESS NOTE    Jose Holden  FGH:[id number] DOB: Sep 28, 1955 DOA: 09/04/2020 PCP: Hoyt Koch, MD   Chief Complain: Weakness/dizziness  Brief Narrative: Patient is a 65 year old male with history of esophageal stenosis, A. fib on aspirin who presented with complaints of weakness, dizziness, 3 to 4 days history of melanotic stool.  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  History of several EGDs in the past for esophageal dilation for stenosis.  On presentation his hemoglobin was found to be in the range of 8, previous hemoglobin was in the range of 16.  Transfused with 2 units of PRBC on presentation, GI consulted, underwent EGD  without finding of clear source of bleeding.  Underwent video capsule study , GI following.  Assessment & Plan:   Principal Problem:   Melena Active Problems:   Atrial fibrillation, chronic (HCC)   Esophageal stenosis   Anemia associated with acute blood loss   GIB (gastrointestinal bleeding)   Upper GI bleed/melena: Hb dropped to 7.2 today.  He reported 2 large melanotic stools.  Baseline hemoglobin around 16.  GI consulted,  underwent EGD  without finding of clear source of bleeding.  Undergwent video capsule study.  Takes aspirin at home which is on hold.  Continue Protonix 40 mg daily.  Acute blood loss normocytic anemia: Secondary to upper GI bleed.  Transfused with 2 units of PRBC.  Monitor H&H.  Hemoglobin this morning 7.2.  We will continue to check hemoglobin every 12 hours.  Paroxysmal A. fib: Currently rate is controlled.  On aspirin at baseline.  EKG showed normal sinus rhythm, PVCs  History of esophageal stenosis: Currently stable        DVT prophylaxis:SCD Code Status: Full Family Communication: None Status is: Inpatient  Remains inpatient appropriate because:Inpatient level of care appropriate due to severity of illness   Dispo: The patient is from: Home               Anticipated d/c is to: Home              Patient currently is not medically stable to d/c.   Difficult to place patient No    Consultants: GI  Procedures:None  Antimicrobials:  Anti-infectives (From admission, onward)   None      Subjective: Patient seen and examined the bedside this morning.  Hemodynamically stable during my evaluation.  Appears comfortable.  Very concerned about his drop in the hemoglobin.  Reported to large melanotic stoold since yesterday  Objective: Vitals:   09/05/20 0935 09/05/20 1048 09/05/20 1457 09/06/20 0828  BP: 100/62 94/71 110/71 90/62  Pulse: 78 71 84 65  Resp: (!) 22 18 14 17   Temp:  (!) 97.5 F (36.4 C) 97.8 F (36.6 C) 97.8 F (36.6 C)  TempSrc:  Oral Oral Oral  SpO2: 96% 100% 100% 99%  Weight:      Height:       No intake or output data in the 24 hours ending 09/06/20 1134 Filed Weights   09/04/20 1644  Weight: 89 kg    Examination:  General exam: Overall comfortable, not in distress HEENT: PERRL Respiratory system:  no wheezes or crackles  Cardiovascular system: S1 & S2 heard, RRR.  Gastrointestinal system: Abdomen is nondistended, soft and nontender. Central nervous system: Alert and oriented Extremities: No edema, no clubbing ,no cyanosis Skin: No rashes, no ulcers,no icterus   Data Reviewed: I have personally reviewed following labs and imaging  studies  CBC: Recent Labs  Lab 09/04/20 1138 09/04/20 1839 09/05/20 0241 09/05/20 1818 09/06/20 0248  WBC 6.1  --  7.5  --  5.4  NEUTROABS  --   --   --   --  2.7  HGB 8.4* 7.9* 8.3* 8.5* 7.2*  HCT 25.5* 23.3* 24.3* 25.6* 21.3*  MCV 97.3  --  93.1  --  94.7  PLT 237  --  223  --  376   Basic Metabolic Panel: Recent Labs  Lab 09/04/20 1138 09/05/20 0241  NA 137 138  K 3.8 3.5  CL 107 111  CO2 23 24  GLUCOSE 118* 110*  BUN 38* 33*  CREATININE 0.92 0.97  CALCIUM 8.5* 8.1*   GFR: Estimated Creatinine Clearance: 98.1 mL/min (by C-G formula based on SCr of 0.97  mg/dL). Liver Function Tests: Recent Labs  Lab 09/04/20 1138  AST 21  ALT 18  ALKPHOS 39  BILITOT 0.8  PROT 5.0*  ALBUMIN 3.1*   No results for input(s): LIPASE, AMYLASE in the last 168 hours. No results for input(s): AMMONIA in the last 168 hours. Coagulation Profile: No results for input(s): INR, PROTIME in the last 168 hours. Cardiac Enzymes: No results for input(s): CKTOTAL, CKMB, CKMBINDEX, TROPONINI in the last 168 hours. BNP (last 3 results) No results for input(s): PROBNP in the last 8760 hours. HbA1C: No results for input(s): HGBA1C in the last 72 hours. CBG: No results for input(s): GLUCAP in the last 168 hours. Lipid Profile: No results for input(s): CHOL, HDL, LDLCALC, TRIG, CHOLHDL, LDLDIRECT in the last 72 hours. Thyroid Function Tests: No results for input(s): TSH, T4TOTAL, FREET4, T3FREE, THYROIDAB in the last 72 hours. Anemia Panel: No results for input(s): VITAMINB12, FOLATE, FERRITIN, TIBC, IRON, RETICCTPCT in the last 72 hours. Sepsis Labs: No results for input(s): PROCALCITON, LATICACIDVEN in the last 168 hours.  Recent Results (from the past 240 hour(s))  Resp Panel by RT-PCR (Flu A&B, Covid) Nasopharyngeal Swab     Status: None   Collection Time: 09/04/20 12:24 PM   Specimen: Nasopharyngeal Swab; Nasopharyngeal(NP) swabs in vial transport medium  Result Value Ref Range Status   SARS Coronavirus 2 by RT PCR NEGATIVE NEGATIVE Final    Comment: (NOTE) SARS-CoV-2 target nucleic acids are NOT DETECTED.  The SARS-CoV-2 RNA is generally detectable in upper respiratory specimens during the acute phase of infection. The lowest concentration of SARS-CoV-2 viral copies this assay can detect is 138 copies/mL. A negative result does not preclude SARS-Cov-2 infection and should not be used as the sole basis for treatment or other patient management decisions. A negative result may occur with  improper specimen collection/handling, submission of specimen  other than nasopharyngeal swab, presence of viral mutation(s) within the areas targeted by this assay, and inadequate number of viral copies(<138 copies/mL). A negative result must be combined with clinical observations, patient history, and epidemiological information. The expected result is Negative.  Fact Sheet for Patients:  EntrepreneurPulse.com.au  Fact Sheet for Healthcare Providers:  IncredibleEmployment.be  This test is no t yet approved or cleared by the [place] FDA and  has been authorized for detection and/or diagnosis of SARS-CoV-2 by FDA under an Emergency Use Authorization (EUA). This EUA will remain  in effect (meaning this test can be used) for the duration of the COVID-19 declaration under Section 564(b)(1) of the Act, 21 U.S.C.section 360bbb-3(b)(1), unless the authorization is terminated  or revoked sooner.       Influenza A by PCR NEGATIVE NEGATIVE  Final   Influenza B by PCR NEGATIVE NEGATIVE Final    Comment: (NOTE) The Xpert Xpress SARS-CoV-2/FLU/RSV plus assay is intended as an aid in the diagnosis of influenza from Nasopharyngeal swab specimens and should not be used as a sole basis for treatment. Nasal washings and aspirates are unacceptable for Xpert Xpress SARS-CoV-2/FLU/RSV testing.  Fact Sheet for Patients: EntrepreneurPulse.com.au  Fact Sheet for Healthcare Providers: IncredibleEmployment.be  This test is not yet approved or cleared by the [place] FDA and has been authorized for detection and/or diagnosis of SARS-CoV-2 by FDA under an Emergency Use Authorization (EUA). This EUA will remain in effect (meaning this test can be used) for the duration of the COVID-19 declaration under Section 564(b)(1) of the Act, 21 U.S.C. section 360bbb-3(b)(1), unless the authorization is terminated or revoked.  Performed at [facility], [place] [address], [place],  [place] [address]          Radiology Studies: No results found.      Scheduled Meds: . pantoprazole  40 mg Oral Daily  . sodium chloride flush  3 mL Intravenous Q12H  . traZODone  50 mg Oral QHS   Continuous Infusions: . sodium chloride       LOS: 2 days    Time spent: 25 mins.More than 50% of that time was spent in counseling and/or coordination of care.      Shelly Coss, MD [facility] P4/04/2021, 11:34 AM

## 2020-09-06 NOTE — Plan of Care (Signed)
  Problem: Education: Goal: Knowledge of General Education information will improve Description: Including pain rating scale, medication(s)/side effects and non-pharmacologic comfort measures Outcome: Progressing   Problem: Clinical Measurements: Goal: Diagnostic test results will improve Outcome: Progressing Goal: Respiratory complications will improve Outcome: Progressing Goal: Cardiovascular complication will be avoided Outcome: Progressing   Problem: Activity: Goal: Risk for activity intolerance will decrease Outcome: Progressing   Problem: Nutrition: Goal: Adequate nutrition will be maintained Outcome: Progressing   

## 2020-09-07 ENCOUNTER — Ambulatory Visit: Payer: 59 | Admitting: Internal Medicine

## 2020-09-07 ENCOUNTER — Encounter (HOSPITAL_COMMUNITY): Payer: Self-pay | Admitting: Family Medicine

## 2020-09-07 ENCOUNTER — Inpatient Hospital Stay (HOSPITAL_COMMUNITY): Payer: 59 | Admitting: Physician Assistant

## 2020-09-07 ENCOUNTER — Encounter (HOSPITAL_COMMUNITY)
Admission: EM | Disposition: A | Discharge status: Home / Self Care | Payer: Self-pay | Source: Home / Self Care | Attending: Internal Medicine

## 2020-09-07 DIAGNOSIS — K922 Gastrointestinal hemorrhage, unspecified: Secondary | ICD-10-CM

## 2020-09-07 DIAGNOSIS — R195 Other fecal abnormalities: Secondary | ICD-10-CM

## 2020-09-07 HISTORY — PX: COLONOSCOPY WITH PROPOFOL: SHX5780

## 2020-09-07 LAB — CBC WITH DIFFERENTIAL/PLATELET
Abs Immature Granulocytes: 0.04 10*3/uL (ref 0.00–0.07)
Basophils Absolute: 0 10*3/uL (ref 0.0–0.1)
Basophils Relative: 1 %
Eosinophils Absolute: 0.1 10*3/uL (ref 0.0–0.5)
Eosinophils Relative: 2 %
HCT: 23.5 % — ABNORMAL LOW (ref 39.0–52.0)
Hemoglobin: 8.1 g/dL — ABNORMAL LOW (ref 13.0–17.0)
Immature Granulocytes: 1 %
Lymphocytes Relative: 27 %
Lymphs Abs: 1.4 10*3/uL (ref 0.7–4.0)
MCH: 32.5 pg (ref 26.0–34.0)
MCHC: 34.5 g/dL (ref 30.0–36.0)
MCV: 94.4 fL (ref 80.0–100.0)
Monocytes Absolute: 0.5 10*3/uL (ref 0.1–1.0)
Monocytes Relative: 10 %
Neutro Abs: 3.2 10*3/uL (ref 1.7–7.7)
Neutrophils Relative %: 59 %
Platelets: 270 10*3/uL (ref 150–400)
RBC: 2.49 MIL/uL — ABNORMAL LOW (ref 4.22–5.81)
RDW: 15.2 % (ref 11.5–15.5)
WBC: 5.3 10*3/uL (ref 4.0–10.5)
nRBC: 0 % (ref 0.0–0.2)

## 2020-09-07 LAB — HEMOGLOBIN AND HEMATOCRIT, BLOOD
HCT: 21.9 % — ABNORMAL LOW (ref 39.0–52.0)
Hemoglobin: 7.3 g/dL — ABNORMAL LOW (ref 13.0–17.0)

## 2020-09-07 SURGERY — COLONOSCOPY WITH PROPOFOL
Anesthesia: Monitor Anesthesia Care

## 2020-09-07 MED ORDER — PHENYLEPHRINE HCL-NACL 10-0.9 MG/250ML-% IV SOLN
INTRAVENOUS | Status: DC | PRN
  Administered 2020-09-07: 50 ug/min via INTRAVENOUS

## 2020-09-07 MED ORDER — FUROSEMIDE 10 MG/ML IJ SOLN
20.0000 mg | Freq: Once | INTRAMUSCULAR | Status: AC
  Administered 2020-09-08: 20 mg via INTRAVENOUS
  Filled 2020-09-07: qty 2

## 2020-09-07 MED ORDER — SODIUM CHLORIDE 0.9 % IV SOLN: INTRAVENOUS | Status: DC

## 2020-09-07 MED ORDER — PROPOFOL 500 MG/50ML IV EMUL
INTRAVENOUS | Status: DC | PRN
  Administered 2020-09-07: 100 ug/kg/min via INTRAVENOUS

## 2020-09-07 MED ORDER — SODIUM CHLORIDE 0.9 % IV BOLUS
1000.0000 mL | Freq: Once | INTRAVENOUS | Status: AC
  Administered 2020-09-07: 1000 mL via INTRAVENOUS

## 2020-09-07 MED ORDER — DIPHENHYDRAMINE HCL 25 MG PO CAPS
25.0000 mg | ORAL_CAPSULE | Freq: Once | ORAL | Status: AC
  Administered 2020-09-08: 25 mg via ORAL
  Filled 2020-09-07: qty 1

## 2020-09-07 MED ORDER — LACTATED RINGERS IV SOLN: INTRAVENOUS | Status: DC | PRN

## 2020-09-07 MED ORDER — ACETAMINOPHEN 325 MG PO TABS
650.0000 mg | ORAL_TABLET | Freq: Once | ORAL | Status: AC
  Administered 2020-09-08: 650 mg via ORAL
  Filled 2020-09-07: qty 2

## 2020-09-07 MED ORDER — SODIUM CHLORIDE 0.9% IV SOLUTION: Freq: Once | INTRAVENOUS | Status: AC

## 2020-09-07 MED ORDER — LIDOCAINE 2% (20 MG/ML) 5 ML SYRINGE
INTRAMUSCULAR | Status: DC | PRN
  Administered 2020-09-07: 50 mg via INTRAVENOUS

## 2020-09-07 MED ORDER — PHENYLEPHRINE 40 MCG/ML (10ML) SYRINGE FOR IV PUSH (FOR BLOOD PRESSURE SUPPORT)
PREFILLED_SYRINGE | INTRAVENOUS | Status: DC | PRN
  Administered 2020-09-07 (×2): 200 ug via INTRAVENOUS

## 2020-09-07 SURGICAL SUPPLY — 22 items

## 2020-09-07 NOTE — Transfer of Care (Signed)
Immediate Anesthesia Transfer of Care Note  Patient: Jose Holden  Procedure(s) Performed: COLONOSCOPY WITH PROPOFOL (N/A )  Patient Location: PACU  Anesthesia Type:MAC  Level of Consciousness: lethargic and responds to stimulation  Airway & Oxygen Therapy: Patient Spontanous Breathing and Patient connected to nasal cannula oxygen  Post-op Assessment: Report given to RN  Post vital signs: stable and unstable  Last Vitals:  Vitals Value Taken Time  BP 93/71 09/07/20 1740  Temp    Pulse 81 09/07/20 1741  Resp 13 09/07/20 1741  SpO2 100 % 09/07/20 1741  Vitals shown include unvalidated device data.  Last Pain:  Vitals:   09/07/20 1610  TempSrc: Oral  PainSc: 0-No pain         Complications: No complications documented.

## 2020-09-07 NOTE — Interval H&P Note (Signed)
History and Physical Interval Note: For colonoscopy today to eval melenic stools seen in cecum/rt colon at recent VCE. The nature of the procedure, as well as the risks, benefits, and alternatives were carefully and thoroughly reviewed with the patient. Ample time for discussion and questions allowed. The patient understood, was satisfied, and agreed to proceed.    09/07/2020 4:22 PM  [name]  has presented today for surgery, with the diagnosis of GI bleed.  The various methods of treatment have been discussed with the patient and family. After consideration of risks, benefits and other options for treatment, the patient has consented to  Procedure(s): COLONOSCOPY WITH PROPOFOL (N/A) as a surgical intervention.  The patient's history has been reviewed, patient examined, no change in status, stable for surgery.  I have reviewed the patient's chart and labs.  Questions were answered to the patient's satisfaction.     Lajuan Lines Karnell Vanderloop

## 2020-09-07 NOTE — Progress Notes (Signed)
PROGRESS NOTE    Jose Holden  YTK:[id number] DOB: 03-29-1956 DOA: 09/04/2020 PCP: Hoyt Koch, MD   Chief Complain: Weakness/dizziness  Brief Narrative: Patient is a 65 year old male with history of esophageal stenosis, A. fib on aspirin who presented with complaints of weakness, dizziness, 3 to 4 days history of melanotic stool.  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  History of several EGDs in the past for esophageal dilation for stenosis.  On presentation his hemoglobin was found to be in the range of 8, previous hemoglobin was in the range of 16.  Transfused with 2 units of PRBC on presentation, GI consulted, underwent EGD  without finding of clear source of bleeding.  Underwent video capsule study which showed normal small bowel but also showed dark melanic appearing stool in the cecum. GI following.  Plan for colonoscopy today.  His blood pressure remains soft, on IV fluids.  Plan for PT/OT evaluation tomorrow  Assessment & Plan:   Principal Problem:   Melena Active Problems:   Atrial fibrillation, chronic (HCC)   Esophageal stenosis   Anemia associated with acute blood loss   GIB (gastrointestinal bleeding)   Upper GI bleed/melena:  Baseline hemoglobin around 16.  Presented with hemoglobin range of 7.  GI consulted,  underwent EGD  without finding of clear source of bleeding. Underwent video capsule study which showed normal small bowel but also showed dark melanic appearing stool in the cecum.  Plan for colonoscopy. Takes aspirin at home which is on hold.  Continue Protonix 40 mg daily.  Acute blood loss normocytic anemia: Secondary to upper GI bleed.  Transfused with 2 units of PRBC.  Monitor H&H.  We will continue to check hemoglobin every 12 hours.  Hypotension/dizziness: Blood pressure remains soft.  Also complains of dizziness.  Given a bolus of normal saline 1 L today, continue fluids at 100 mL/h.  PT/OT  evaluation tomorrow  Paroxysmal A. fib: Currently rate is controlled.  On aspirin at home.  EKG showed normal sinus rhythm, PVCs.  Will check with GI before discharge about the duration of resumption of aspirin  History of esophageal stenosis: Currently stable        DVT prophylaxis:SCD Code Status: Full Family Communication: None Status is: Inpatient  Remains inpatient appropriate because:Inpatient level of care appropriate due to severity of illness   Dispo: The patient is from: Home              Anticipated d/c is to: Home              Patient currently is not medically stable to d/c.   Difficult to place patient No    Consultants: GI  Procedures:None  Antimicrobials:  Anti-infectives (From admission, onward)   None      Subjective: Patient seen and examined at the bedside this morning.  He was on commode when I entered the room.  He said he was having loose dark stool.  Blood pressure was soft and he complained of some dizziness while standing this afternoon.  Denies any abdominal pain.  Objective: Vitals:   09/06/20 2200 09/07/20 0641 09/07/20 0759 09/07/20 1220  BP: 112/61 98/67 (!) 88/59 (!) 87/60  Pulse: 80 79 78 80  Resp: 18 18 14    Temp: 97.9 F (36.6 C) 98.4 F (36.9 C) 97.8 F (36.6 C)   TempSrc: Oral Oral Oral   SpO2: 99% 100% 100%   Weight:  Height:       No intake or output data in the 24 hours ending 09/07/20 1319 Filed Weights   09/04/20 1644  Weight: 89 kg    Examination:  General exam: Overall comfortable, not in distress, was on commode when I entered the room    Data Reviewed: I have personally reviewed following labs and imaging studies  CBC: Recent Labs  Lab 09/04/20 1138 09/04/20 1839 09/05/20 0241 09/05/20 1818 09/06/20 0248 09/06/20 1444 09/06/20 2245 09/07/20 0741  WBC 6.1  --  7.5  --  5.4  --   --  5.3  NEUTROABS  --   --   --   --  2.7  --   --  3.2  HGB 8.4*   < > 8.3* 8.5* 7.2* 8.2* 7.5* 8.1*  HCT  25.5*   < > 24.3* 25.6* 21.3* 24.1* 21.9* 23.5*  MCV 97.3  --  93.1  --  94.7  --   --  94.4  PLT 237  --  223  --  203  --   --  270   < > = values in this interval not displayed.   Basic Metabolic Panel: Recent Labs  Lab 09/04/20 1138 09/05/20 0241  NA 137 138  K 3.8 3.5  CL 107 111  CO2 23 24  GLUCOSE 118* 110*  BUN 38* 33*  CREATININE 0.92 0.97  CALCIUM 8.5* 8.1*   GFR: Estimated Creatinine Clearance: 98.1 mL/min (by C-G formula based on SCr of 0.97 mg/dL). Liver Function Tests: Recent Labs  Lab 09/04/20 1138  AST 21  ALT 18  ALKPHOS 39  BILITOT 0.8  PROT 5.0*  ALBUMIN 3.1*   No results for input(s): LIPASE, AMYLASE in the last 168 hours. No results for input(s): AMMONIA in the last 168 hours. Coagulation Profile: No results for input(s): INR, PROTIME in the last 168 hours. Cardiac Enzymes: No results for input(s): CKTOTAL, CKMB, CKMBINDEX, TROPONINI in the last 168 hours. BNP (last 3 results) No results for input(s): PROBNP in the last 8760 hours. HbA1C: No results for input(s): HGBA1C in the last 72 hours. CBG: No results for input(s): GLUCAP in the last 168 hours. Lipid Profile: No results for input(s): CHOL, HDL, LDLCALC, TRIG, CHOLHDL, LDLDIRECT in the last 72 hours. Thyroid Function Tests: No results for input(s): TSH, T4TOTAL, FREET4, T3FREE, THYROIDAB in the last 72 hours. Anemia Panel: No results for input(s): VITAMINB12, FOLATE, FERRITIN, TIBC, IRON, RETICCTPCT in the last 72 hours. Sepsis Labs: No results for input(s): PROCALCITON, LATICACIDVEN in the last 168 hours.  Recent Results (from the past 240 hour(s))  Resp Panel by RT-PCR (Flu A&B, Covid) Nasopharyngeal Swab     Status: None   Collection Time: 09/04/20 12:24 PM   Specimen: Nasopharyngeal Swab; Nasopharyngeal(NP) swabs in vial transport medium  Result Value Ref Range Status   SARS Coronavirus 2 by RT PCR NEGATIVE NEGATIVE Final    Comment: (NOTE) SARS-CoV-2 target nucleic acids are  NOT DETECTED.  The SARS-CoV-2 RNA is generally detectable in upper respiratory specimens during the acute phase of infection. The lowest concentration of SARS-CoV-2 viral copies this assay can detect is 138 copies/mL. A negative result does not preclude SARS-Cov-2 infection and should not be used as the sole basis for treatment or other patient management decisions. A negative result may occur with  improper specimen collection/handling, submission of specimen other than nasopharyngeal swab, presence of viral mutation(s) within the areas targeted by this assay, and inadequate number of viral copies(<138  copies/mL). A negative result must be combined with clinical observations, patient history, and epidemiological information. The expected result is Negative.  Fact Sheet for Patients:  EntrepreneurPulse.com.au  Fact Sheet for Healthcare Providers:  IncredibleEmployment.be  This test is no t yet approved or cleared by the [place] FDA and  has been authorized for detection and/or diagnosis of SARS-CoV-2 by FDA under an Emergency Use Authorization (EUA). This EUA will remain  in effect (meaning this test can be used) for the duration of the COVID-19 declaration under Section 564(b)(1) of the Act, 21 U.S.C.section 360bbb-3(b)(1), unless the authorization is terminated  or revoked sooner.       Influenza A by PCR NEGATIVE NEGATIVE Final   Influenza B by PCR NEGATIVE NEGATIVE Final    Comment: (NOTE) The Xpert Xpress SARS-CoV-2/FLU/RSV plus assay is intended as an aid in the diagnosis of influenza from Nasopharyngeal swab specimens and should not be used as a sole basis for treatment. Nasal washings and aspirates are unacceptable for Xpert Xpress SARS-CoV-2/FLU/RSV testing.  Fact Sheet for Patients: EntrepreneurPulse.com.au  Fact Sheet for Healthcare Providers: IncredibleEmployment.be  This test is not  yet approved or cleared by the [place] FDA and has been authorized for detection and/or diagnosis of SARS-CoV-2 by FDA under an Emergency Use Authorization (EUA). This EUA will remain in effect (meaning this test can be used) for the duration of the COVID-19 declaration under Section 564(b)(1) of the Act, 21 U.S.C. section 360bbb-3(b)(1), unless the authorization is terminated or revoked.  Performed at [facility], [address], [place], [place] [address]          Radiology Studies: No results found.      Scheduled Meds: . pantoprazole  40 mg Oral Daily  . sodium chloride flush  3 mL Intravenous Q12H  . traZODone  50 mg Oral QHS   Continuous Infusions: . sodium chloride    . sodium chloride 100 mL/hr at 09/07/20 1155     LOS: 3 days    Time spent: 25 mins.More than 50% of that time was spent in counseling and/or coordination of care.      Shelly Coss, MD [facility] P4/13/2022, 1:19 PM

## 2020-09-07 NOTE — Anesthesia Postprocedure Evaluation (Signed)
Anesthesia Post Note  Patient: Jose Holden  Procedure(s) Performed: COLONOSCOPY WITH PROPOFOL (N/A )     Patient location during evaluation: Endoscopy Anesthesia Type: MAC Level of consciousness: awake and alert Pain management: pain level controlled Vital Signs Assessment: post-procedure vital signs reviewed and stable Respiratory status: spontaneous breathing, nonlabored ventilation, respiratory function stable and patient connected to nasal cannula oxygen Cardiovascular status: stable and blood pressure returned to baseline Postop Assessment: no apparent nausea or vomiting Anesthetic complications: no   No complications documented.  Last Vitals:  Vitals:   09/07/20 1808 09/07/20 1835  BP: 95/66 117/82  Pulse: 78 70  Resp: 19 17  Temp: 36.6 C 36.6 C  SpO2: 99% 94%    Last Pain:  Vitals:   09/07/20 1835  TempSrc: Oral  PainSc:                  March Rummage Ingri Diemer

## 2020-09-07 NOTE — Op Note (Signed)
[facility] Patient Name: Jose Holden Procedure Date : 09/07/2020 MRN: [id number] Attending MD: Jerene Bears , MD Date of Birth: 02-07-1956 CSN: [id number] Age: 65 Admit Type: Inpatient Procedure:                Colonoscopy Indications:              Gastrointestinal occult blood loss with recent                            unremarkable EGD, video capsule endoscopy was                            normal though melenic stool was seen when the pill                            camera reached the cecum; acute blood loss anemia,                            last colonoscopy Aug 2021 Providers:                Lajuan Lines. Hilarie Fredrickson, MD, Josie Dixon, RN, Laverda Sorenson, Technician Referring MD:             [facility] Medicines:                Monitored Anesthesia Care Complications:            No immediate complications. Estimated Blood Loss:     Estimated blood loss: none. Procedure:                Pre-Anesthesia Assessment:                           - Prior to the procedure, a History and Physical                            was performed, and patient medications and                            allergies were reviewed. The patient's tolerance of                            previous anesthesia was also reviewed. The risks                            and benefits of the procedure and the sedation                            options and risks were discussed with the patient.                            All questions were answered, and informed consent  was obtained. Prior Anticoagulants: The patient has                            taken no previous anticoagulant or antiplatelet                            agents. ASA Grade Assessment: II - A patient with                            mild systemic disease. After reviewing the risks                            and benefits, the patient was deemed in                             satisfactory condition to undergo the procedure.                           After obtaining informed consent, the colonoscope                            was passed under direct vision. Throughout the                            procedure, the patient's blood pressure, pulse, and                            oxygen saturations were monitored continuously. The                            CF-HQ190L ([id number]) Olympus colonoscope was                            introduced through the anus and advanced to the                            terminal ileum. The colonoscopy was performed                            without difficulty. The patient tolerated the                            procedure well. The quality of the bowel                            preparation was fair with copious irrigation and                            lavage. The terminal ileum, ileocecal valve,                            appendiceal orifice, and rectum were photographed. Scope In: 4:55:21 PM Scope Out: 5:30:34 PM Scope Withdrawal Time: 0 hours 27 minutes 2 seconds  Total Procedure Duration: 0  hours 35 minutes 13 seconds  Findings:      The digital rectal exam was normal.      The terminal ileum appeared normal. There was scant dark material felt       to be most consistent with backwash from the cecum.      Hematin (altered blood/coffee-ground-like material) was found in the       entire colon. This was adherent to the colonic wall throughout most       impressively so in the cecum/right colon. Copious irrigation and lavage       was performed and the right colon was closely examined and reexamined       without vascular or polypoid lesion found. No colitis.      Internal hemorrhoids were found during retroflexion. The hemorrhoids       were small. Impression:               - Preparation of the colon was fair due to adherent                            heme/coffee-ground material.                           - The examined portion  of the ileum was normal.                           - Blood (coffee-ground) in the entire examined                            colon without red blood, active bleeding or                            vascular/polypoid lesions.                           - No diverticulosis was seen.                           - Small, non-bleeding internal hemorrhoids.                           - No specimens collected. Moderate Sedation:      N/A Recommendation:           - Return patient to hospital ward for ongoing care.                           - Full liquid diet.                           - Continue present medications.                           - Occult, significant, subacute GI bleeding with                            overall 9 g/dL drop in Hgb. Options for further  investigation include tagged RBC bleeding scan                            (with IR consult if positive) and Meckel's scan. I                            will discuss further with the patient. Procedure Code(s):        --- Professional ---                           [phone], Colonoscopy, flexible; diagnostic, including                            collection of specimen(s) by brushing or washing,                            when performed (separate procedure) Diagnosis Code(s):        --- Professional ---                           K64.8, Other hemorrhoids                           K92.2, Gastrointestinal hemorrhage, unspecified                           R19.5, Other fecal abnormalities CPT copyright 2019 American Medical Association. All rights reserved. The codes documented in this report are preliminary and upon coder review may  be revised to meet current compliance requirements. Jerene Bears, MD 09/07/2020 6:04:53 PM This report has been signed electronically. Number of Addenda: 0

## 2020-09-07 NOTE — Anesthesia Preprocedure Evaluation (Addendum)
Anesthesia Evaluation  Patient identified by MRN, date of birth, ID band Patient awake    Reviewed: Allergy & Precautions, NPO status , Patient's Chart, lab work & pertinent test results  Airway Mallampati: I  TM Distance: >3 FB Neck ROM: Full    Dental  (+) Teeth Intact, Dental Advisory Given   Pulmonary neg pulmonary ROS,    Pulmonary exam normal        Cardiovascular + dysrhythmias Atrial Fibrillation + Valvular Problems/Murmurs  Rhythm:Irregular Rate:Normal     Neuro/Psych Anxiety negative neurological ROS     GI/Hepatic negative GI ROS, Neg liver ROS,   Endo/Other  negative endocrine ROS  Renal/GU negative Renal ROS     Musculoskeletal negative musculoskeletal ROS (+)   Abdominal Normal abdominal exam  (+)   Peds  Hematology negative hematology ROS (+) anemia ,   Anesthesia Other Findings   Reproductive/Obstetrics                             Anesthesia Physical  Anesthesia Plan  ASA: III  Anesthesia Plan: MAC   Post-op Pain Management:    Induction: Intravenous  PONV Risk Score and Plan: 0 and Propofol infusion  Airway Management Planned: Natural Airway and Simple Face Mask  Additional Equipment: None  Intra-op Plan:   Post-operative Plan:   Informed Consent: I have reviewed the patients History and Physical, chart, labs and discussed the procedure including the risks, benefits and alternatives for the proposed anesthesia with the patient or authorized representative who has indicated his/her understanding and acceptance.       Plan Discussed with: CRNA  Anesthesia Plan Comments:         Anesthesia Quick Evaluation

## 2020-09-07 NOTE — Anesthesia Procedure Notes (Signed)
Procedure Name: MAC Date/Time: 09/07/2020 4:47 PM Performed by: Barrington Ellison, CRNA Pre-anesthesia Checklist: Patient identified, Emergency Drugs available, Suction available and Timeout performed Patient Re-evaluated:Patient Re-evaluated prior to induction Oxygen Delivery Method: Nasal cannula

## 2020-09-07 NOTE — Plan of Care (Signed)

## 2020-09-07 NOTE — Progress Notes (Signed)
Notified by RN that Hgb now 6.4 from baseline of 7.3. Presented with GI bleed. Reviewed chart.  Ordered 2 units of PRBC to be transfused and will recheck Hgb/Hct 2 hours after transfusion.

## 2020-09-08 ENCOUNTER — Encounter (HOSPITAL_COMMUNITY): Payer: Self-pay | Admitting: Internal Medicine

## 2020-09-08 ENCOUNTER — Inpatient Hospital Stay (HOSPITAL_COMMUNITY): Payer: 59

## 2020-09-08 DIAGNOSIS — K921 Melena: Secondary | ICD-10-CM

## 2020-09-08 LAB — HEMOGLOBIN AND HEMATOCRIT, BLOOD
HCT: 19 % — ABNORMAL LOW (ref 39.0–52.0)
HCT: 22.1 % — ABNORMAL LOW (ref 39.0–52.0)
HCT: 24.4 % — ABNORMAL LOW (ref 39.0–52.0)
HCT: 25 % — ABNORMAL LOW (ref 39.0–52.0)
Hemoglobin: 6.4 g/dL — CL (ref 13.0–17.0)
Hemoglobin: 7.4 g/dL — ABNORMAL LOW (ref 13.0–17.0)
Hemoglobin: 8.4 g/dL — ABNORMAL LOW (ref 13.0–17.0)
Hemoglobin: 8.3 g/dL — ABNORMAL LOW (ref 13.0–17.0)

## 2020-09-08 LAB — BASIC METABOLIC PANEL
Anion gap: 4 — ABNORMAL LOW (ref 5–15)
BUN: 17 mg/dL (ref 8–23)
CO2: 28 mmol/L (ref 22–32)
Calcium: 7.9 mg/dL — ABNORMAL LOW (ref 8.9–10.3)
Chloride: 107 mmol/L (ref 98–111)
Creatinine, Ser: 0.98 mg/dL (ref 0.61–1.24)
GFR, Estimated: >60 mL/min (ref >60)
Glucose, Bld: 98 mg/dL (ref 70–99)
Potassium: 3.8 mmol/L (ref 3.5–5.1)
Sodium: 139 mmol/L (ref 135–145)

## 2020-09-08 LAB — PREPARE RBC (CROSSMATCH)

## 2020-09-08 NOTE — Evaluation (Addendum)
Physical Therapy Evaluation Patient Details Name: Jose Holden MRN: [id number] DOB: 10/17/1955 Today's Date: 09/08/2020   History of Present Illness  pt is 65 year old white male admitted 4/10 with acute GI bleed, of unclear etiology  Capsule endoscopy negative for any findings in the small bowel, melena noted in the cecum and throughout the remainder of the visualized colon.  Clinical Impression  Pt fully participated in evaluation. Pt I PTA. Pt requiring S-min guard due to mild balance deficits. Pt will benefit from skilled PT to address balance to maximize independence and decrease risk of falling to increase safety at home.     Follow Up Recommendations No PT follow up    Equipment Recommendations  None recommended by PT    Recommendations for Other Services       Precautions / Restrictions Precautions Precautions: None Restrictions Weight Bearing Restrictions: No      Mobility  Bed Mobility Overal bed mobility: Independent                  Transfers Overall transfer level: Needs assistance   Transfers: Sit to/from Stand Sit to Stand: Supervision            Ambulation/Gait Ambulation/Gait assistance: Supervision;Min guard Gait Distance (Feet): 400 Feet         General Gait Details: S initially wtih continued ambulation 3 LOB noted with pt able to regain balance wtih steadying assist  Stairs Stairs: Yes Stairs assistance: Min guard Stair Management: One rail Left Number of Stairs: 9    Wheelchair Mobility    Modified Rankin (Stroke Patients Only)       Balance Overall balance assessment: Mild deficits observed, not formally tested                                           Pertinent Vitals/Pain Pain Assessment: No/denies pain    Home Living Family/patient expects to be discharged to:: Private residence Living Arrangements: Alone Available Help at Discharge: Available PRN/intermittently Type of Home:  Apartment Home Access: Other (comment)     Home Layout: One level Home Equipment: None      Prior Function Level of Independence: Independent               Hand Dominance   Dominant Hand: Right    Extremity/Trunk Assessment   Upper Extremity Assessment Upper Extremity Assessment: Defer to OT evaluation    Lower Extremity Assessment Lower Extremity Assessment: Overall WFL for tasks assessed       Communication   Communication: No difficulties  Cognition Arousal/Alertness: Awake/alert Behavior During Therapy: WFL for tasks assessed/performed Overall Cognitive Status: Within Functional Limits for tasks assessed                                        General Comments      Exercises     Assessment/Plan    PT Assessment Patient needs continued PT services  PT Problem List Decreased balance       PT Treatment Interventions Balance training    PT Goals (Current goals can be found in the Care Plan section)  Acute Rehab PT Goals Patient Stated Goal: to go home PT Goal Formulation: With patient Time For Goal Achievement: 09/22/20 Potential to Achieve Goals: Good    Frequency Min  3X/week   Barriers to discharge        Co-evaluation PT/OT/SLP Co-Evaluation/Treatment: Yes Reason for Co-Treatment: Other (comment) (pt has been off floor and needed evaluation) PT goals addressed during session: Mobility/safety with mobility;Balance         AM-PAC PT "6 Clicks" Mobility  Outcome Measure Help needed turning from your back to your side while in a flat bed without using bedrails?: None Help needed moving from lying on your back to sitting on the side of a flat bed without using bedrails?: None Help needed moving to and from a bed to a chair (including a wheelchair)?: A Little Help needed standing up from a chair using your arms (e.g., wheelchair or bedside chair)?: A Little Help needed to walk in hospital room?: A Little Help needed  climbing 3-5 steps with a railing? : A Little 6 Click Score: 20    End of Session Equipment Utilized During Treatment: Gait belt Activity Tolerance: Patient tolerated treatment well Patient left: Other (comment) (finishing with OT) Nurse Communication: Mobility status PT Visit Diagnosis: Unsteadiness on feet (R26.81)    Time: 5035-4656 PT Time Calculation (min) (ACUTE ONLY): 14 min   Charges:   PT Evaluation $PT Eval Low Complexity: Vina, DPT Acute Rehabilitation Services [phone]  [place] 09/08/2020, 3:25 PM

## 2020-09-08 NOTE — Progress Notes (Signed)
Patient ID: Jose Holden, male   DOB: 01/26/1956, 65 y.o.   MRN: [id number]    Progress Note   Subjective  Day #4 CC; GI bleeding  Colonoscopy yesterday-finding of coffee-ground appearing blood in the entire colon without evidence of active bleeding or obvious lesions but visualization limited no diverticulosis noted  Hemoglobin down to 6.4 last night-transfusing 2 units-hemoglobin 7.4 this a.m.- pending this afternoon  Bleeding scan is negative.  No stools today , one small BM last pm, not black, appeared more dark brown with some red blood.   Objective   Vital signs in last 24 hours: Temp:  [97.6 F (36.4 C)-98.5 F (36.9 C)] 97.6 F (36.4 C) (04/14 0727) Pulse Rate:  [70-99] 73 (04/14 0727) Resp:  [12-20] 14 (04/14 0727) BP: (85-117)/(54-82) 92/68 (04/14 0727) SpO2:  [94 %-100 %] 100 % (04/14 0727) Last BM Date: 09/07/20 General: Older white male in NAD Heart:  Regular rate and rhythm; no murmurs Lungs: Respirations even and unlabored, lungs CTA bilaterally Abdomen:  Soft, nontender and nondistended. Normal bowel sounds. Extremities:  Without edema. Neurologic:  Alert and oriented,  grossly normal neurologically. Psych:  Cooperative. Normal mood and affect.  Intake/Output from previous day: 04/13 0701 - 04/14 0700 In: 989.4 [I.V.:400; Blood:589.4] Out: -  Intake/Output this shift: No intake/output data recorded.  Lab Results: Recent Labs    09/06/20 0248 09/06/20 1444 09/07/20 0741 09/07/20 1506 09/07/20 2228 09/08/20 0927  WBC 5.4  --  5.3  --   --   --   HGB 7.2*   < > 8.1* 7.3* 6.4* 7.4*  HCT 21.3*   < > 23.5* 21.9* 19.0* 22.1*  PLT 203  --  270  --   --   --    < > = values in this interval not displayed.   BMET Recent Labs    09/08/20 0927  NA 139  K 3.8  CL 107  CO2 28  GLUCOSE 98  BUN 17  CREATININE 0.98  CALCIUM 7.9*   LFT No results for input(s): PROT, ALBUMIN, AST, ALT, ALKPHOS, BILITOT, BILIDIR, IBILI in the last 72  hours. PT/INR No results for input(s): LABPROT, INR in the last 72 hours.      Assessment / Plan:    #58 65 year old male with acute GI bleeding of unclear etiology. EGD this admission negative Capsule endoscopy this admission negative for any findings in the small bowel, there is melena noted in the cecum and then throughout the remainder of the visualized colon.  Colonoscopy yesterday with adherent melena throughout the colon, no active red blood, no diverticulosis noted, no other lesions noted but limited visualization due to adherent melena.  Bleeding scan today negative  Patient had further drift in hemoglobin last evening down to 6.3 has been transfused 2 units of packed RBCs..  Plan; continue serial hemoglobins and transfuse as indicated Will schedule for Meckel scan If Meckel's is unrevealing and has persistent evidence of continued oozing may need repeat bowel prep and repeat colonoscopy.  Principal Problem:   Melena Active Problems:   Atrial fibrillation, chronic (HCC)   Esophageal stenosis   Anemia associated with acute blood loss   GIB (gastrointestinal bleeding)   Occult GI bleeding     LOS: 4 days   Alton Tremblay PA-C 09/08/2020, 11:44 AM

## 2020-09-08 NOTE — Progress Notes (Signed)
PT Cancellation Note  Patient Details Name: Jose Holden MRN: [id number] DOB: Oct 07, 1955   Cancelled Treatment:    Reason Eval/Treat Not Completed: Patient at procedure or test/unavailable. Pt off floor at nuclear medicine, will attempt later time permitting  Lyanne Co, DPT Acute Rehabilitation Services [phone]   [place] 09/08/2020, 12:00 PM

## 2020-09-08 NOTE — Progress Notes (Signed)
PROGRESS NOTE    Jose Holden  ZOX:[id number] DOB: 1956-05-28 DOA: 09/04/2020 PCP: Hoyt Koch, MD   Chief Complain: Weakness/dizziness  Brief Narrative: Patient is a 65 year old male with history of esophageal stenosis, A. fib on aspirin who presented with complaints of weakness, dizziness, 3 to 4 days history of melanotic stool.  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  History of several EGDs in the past for esophageal dilation for stenosis.  On presentation his hemoglobin was found to be in the range of 8, previous hemoglobin was in the range of 16.  Transfused with 2 units of PRBC on presentation, GI consulted, underwent EGD  without finding of clear source of bleeding.  Underwent video capsule study which showed normal small bowel but also showed dark melanic appearing stool in the cecum. GI following.  Plan for colonoscopy today.  His blood pressure remains soft, on IV fluids.  Plan for PT/OT evaluation tomorrow  Assessment & Plan:   Principal Problem:   Melena Active Problems:   Atrial fibrillation, chronic (HCC)   Esophageal stenosis   Anemia associated with acute blood loss   GIB (gastrointestinal bleeding)   Occult GI bleeding   GI bleed/melena/acute blood loss anemia:   -Baseline hemoglobin around 16.  Presented with hemoglobin range of 7.   -GI consulted,  underwent EGD  without finding of clear source of bleeding. Underwent video capsule study which showed normal small bowel but also showed dark melanic appearing stool in the cecum.   - colonoscopy on 4/13 not able to identify source -Tagged RBC scan today negative -Hemoglobin continue to drop require supportive transfusion, GI plan to do meckel's  scan tomorrow, possible angiogram -Takes aspirin at home which is on hold.  Continue Protonix 40 mg daily. -Monitor hemoglobin every 8 hours , supportive transfusion , will follow GI  recommendation    Hypotension/dizziness: Blood pressure remains soft.  Also complains of dizziness.  Given a bolus of normal saline 1 L today, continue fluids at 100 mL/h. Supportive PRBC transfusion as needed Reported feeling better today ,PT/OT evaluation   Paroxysmal A. fib: Currently rate is controlled.  On aspirin at home.  EKG showed normal sinus rhythm, PVCs.   Home medication propranolol on hold due to borderline hypotension Will check with GI before discharge about the duration of resumption of aspirin  History of esophageal stenosis: Currently stable        DVT prophylaxis:SCD Code Status: Full Family Communication: None Status is: Inpatient  Remains inpatient appropriate because:Inpatient level of care appropriate due to severity of illness   Dispo: The patient is from: Home              Anticipated d/c is to: Home              Patient currently is not medically stable to d/c.   Difficult to place patient No    Consultants: GI  Procedures: EGD/capsule study/colonoscopy  Antimicrobials:  Anti-infectives (From admission, onward)   None      Subjective: Patient seen and examined after return from bleeding scan He is sitting up in chair, appears comfortable Report last BM was yesterday morning, denies abdominal pain    Objective: Vitals:   09/08/20 0711 09/08/20 0727 09/08/20 1325 09/08/20 1502  BP: (!) 89/64 92/68 106/78 97/65  Pulse: 81 73 83 66  Resp: 16 14 15 17   Temp: 98.1 F (36.7 C) 97.6 F (36.4 C)  98.5  F (36.9 C)  TempSrc: Oral [place]  SpO2: 98% 100% 98% 97%  Weight:      Height:        Intake/Output Summary (Last 24 hours) at 09/08/2020 1716 Last data filed at 09/08/2020 1650 Gross per 24 hour  Intake 1489.39 ml  Output --  Net 1489.39 ml   Filed Weights   09/04/20 1644  Weight: 89 kg    Examination:   General:  NAD  Cardiovascular: RRR  Respiratory: CTABL  Abdomen: Soft/ND/NT, positive  BS  Musculoskeletal: No Edema  Neuro: alert, oriented      Data Reviewed: I have personally reviewed following labs and imaging studies  CBC: Recent Labs  Lab 09/04/20 1138 09/04/20 1839 09/05/20 0241 09/05/20 1818 09/06/20 0248 09/06/20 1444 09/07/20 0741 09/07/20 1506 09/07/20 2228 09/08/20 0927 09/08/20 1409  WBC 6.1  --  7.5  --  5.4  --  5.3  --   --   --   --   NEUTROABS  --   --   --   --  2.7  --  3.2  --   --   --   --   HGB 8.4*   < > 8.3*   < > 7.2*   < > 8.1* 7.3* 6.4* 7.4* 8.4*  HCT 25.5*   < > 24.3*   < > 21.3*   < > 23.5* 21.9* 19.0* 22.1* 24.4*  MCV 97.3  --  93.1  --  94.7  --  94.4  --   --   --   --   PLT 237  --  223  --  203  --  270  --   --   --   --    < > = values in this interval not displayed.   Basic Metabolic Panel: Recent Labs  Lab 09/04/20 1138 09/05/20 0241 09/08/20 0927  NA 137 138 139  K 3.8 3.5 3.8  CL 107 111 107  CO2 23 24 28   GLUCOSE 118* 110* 98  BUN 38* 33* 17  CREATININE 0.92 0.97 0.98  CALCIUM 8.5* 8.1* 7.9*   GFR: Estimated Creatinine Clearance: 97.1 mL/min (by C-G formula based on SCr of 0.98 mg/dL). Liver Function Tests: Recent Labs  Lab 09/04/20 1138  AST 21  ALT 18  ALKPHOS 39  BILITOT 0.8  PROT 5.0*  ALBUMIN 3.1*   No results for input(s): LIPASE, AMYLASE in the last 168 hours. No results for input(s): AMMONIA in the last 168 hours. Coagulation Profile: No results for input(s): INR, PROTIME in the last 168 hours. Cardiac Enzymes: No results for input(s): CKTOTAL, CKMB, CKMBINDEX, TROPONINI in the last 168 hours. BNP (last 3 results) No results for input(s): PROBNP in the last 8760 hours. HbA1C: No results for input(s): HGBA1C in the last 72 hours. CBG: No results for input(s): GLUCAP in the last 168 hours. Lipid Profile: No results for input(s): CHOL, HDL, LDLCALC, TRIG, CHOLHDL, LDLDIRECT in the last 72 hours. Thyroid Function Tests: No results for input(s): TSH, T4TOTAL, FREET4, T3FREE,  THYROIDAB in the last 72 hours. Anemia Panel: No results for input(s): VITAMINB12, FOLATE, FERRITIN, TIBC, IRON, RETICCTPCT in the last 72 hours. Sepsis Labs: No results for input(s): PROCALCITON, LATICACIDVEN in the last 168 hours.  Recent Results (from the past 240 hour(s))  Resp Panel by RT-PCR (Flu A&B, Covid) Nasopharyngeal Swab     Status: None   Collection Time: 09/04/20 12:24 PM   Specimen: Nasopharyngeal Swab; Nasopharyngeal(NP) swabs  in vial transport medium  Result Value Ref Range Status   SARS Coronavirus 2 by RT PCR NEGATIVE NEGATIVE Final    Comment: (NOTE) SARS-CoV-2 target nucleic acids are NOT DETECTED.  The SARS-CoV-2 RNA is generally detectable in upper respiratory specimens during the acute phase of infection. The lowest concentration of SARS-CoV-2 viral copies this assay can detect is 138 copies/mL. A negative result does not preclude SARS-Cov-2 infection and should not be used as the sole basis for treatment or other patient management decisions. A negative result may occur with  improper specimen collection/handling, submission of specimen other than nasopharyngeal swab, presence of viral mutation(s) within the areas targeted by this assay, and inadequate number of viral copies(<138 copies/mL). A negative result must be combined with clinical observations, patient history, and epidemiological information. The expected result is Negative.  Fact Sheet for Patients:  EntrepreneurPulse.com.au  Fact Sheet for Healthcare Providers:  IncredibleEmployment.be  This test is no t yet approved or cleared by the [place] FDA and  has been authorized for detection and/or diagnosis of SARS-CoV-2 by FDA under an Emergency Use Authorization (EUA). This EUA will remain  in effect (meaning this test can be used) for the duration of the COVID-19 declaration under Section 564(b)(1) of the Act, 21 U.S.C.section 360bbb-3(b)(1), unless the  authorization is terminated  or revoked sooner.       Influenza A by PCR NEGATIVE NEGATIVE Final   Influenza B by PCR NEGATIVE NEGATIVE Final    Comment: (NOTE) The Xpert Xpress SARS-CoV-2/FLU/RSV plus assay is intended as an aid in the diagnosis of influenza from Nasopharyngeal swab specimens and should not be used as a sole basis for treatment. Nasal washings and aspirates are unacceptable for Xpert Xpress SARS-CoV-2/FLU/RSV testing.  Fact Sheet for Patients: EntrepreneurPulse.com.au  Fact Sheet for Healthcare Providers: IncredibleEmployment.be  This test is not yet approved or cleared by the [place] FDA and has been authorized for detection and/or diagnosis of SARS-CoV-2 by FDA under an Emergency Use Authorization (EUA). This EUA will remain in effect (meaning this test can be used) for the duration of the COVID-19 declaration under Section 564(b)(1) of the Act, 21 U.S.C. section 360bbb-3(b)(1), unless the authorization is terminated or revoked.  Performed at [facility], [place] [address], [place], [place] [address]          Radiology Studies: [facility] GI Blood Loss  Result Date: 09/08/2020 CLINICAL DATA:  Occult GI bleed despite EGD, colonoscopy, and capsule evaluation EXAM: NUCLEAR MEDICINE GASTROINTESTINAL BLEEDING SCAN TECHNIQUE: Sequential abdominal images were obtained following intravenous administration of Tc-49m labeled red blood cells. RADIOPHARMACEUTICALS:  23.3 mCi Tc-17m pertechnetate in-vitro labeled red cells (UltraTag). COMPARISON:  None. FINDINGS: Expected blood pool distribution of labeled red cells. No abnormal gastrointestinal localization of red cells identified to suggest active GI bleeding. IMPRESSION: Negative GI bleeding study. Electronically Signed   By: Lavonia Dana M.D.   On: 09/08/2020 14:05        Scheduled Meds: . pantoprazole  40 mg Oral Daily  . sodium chloride flush  3 mL Intravenous Q12H  .  traZODone  50 mg Oral QHS   Continuous Infusions: . sodium chloride    . sodium chloride 100 mL/hr at 09/08/20 1650     LOS: 4 days    Time spent: 15 mins.More than 50% of that time was spent in counseling and/or coordination of care.      Florencia Reasons, MD PhD FACP [facility] P4/14/2022, 5:16 PM

## 2020-09-08 NOTE — Progress Notes (Signed)
OT Cancellation Note  Patient Details Name: Jose Holden MRN: [id number] DOB: 1956-03-20   Cancelled Treatment:    Reason Eval/Treat Not Completed: Patient at procedure or test/ unavailable. Pt off unit at nuclear medicine. OT will follow up next available time  Britt Bottom 09/08/2020, 11:49 AM

## 2020-09-08 NOTE — Evaluation (Signed)
Occupational Therapy Evaluation Patient Details Name: Jose Holden MRN: [id number] DOB: [birth year] Today's Date: 09/08/2020    History of Present Illness pt is 65 year old white male admitted 4/10 with acute GI bleed, of unclear etiology  Capsule endoscopy negative for any findings in the small bowel, melena noted in the cecum and throughout the remainder of the visualized colon.   Clinical Impression   Pt is Sup - Mod I with ADLs/selcare, Sup - min guard A with mobility with some balance deficits. All education completed and no further acute OT services are indicated at this time. Pt to continue with acute PT services to address functional mobility safety. OT will sign off    Follow Up Recommendations  No OT follow up    Equipment Recommendations  None recommended by OT    Recommendations for Other Services       Precautions / Restrictions Precautions Precautions: None Restrictions Weight Bearing Restrictions: No      Mobility Bed Mobility Overal bed mobility: Independent                  Transfers Overall transfer level: Needs assistance   Transfers: Sit to/from Stand Sit to Stand: Supervision              Balance Overall balance assessment: Mild deficits observed, not formally tested                                         ADL either performed or assessed with clinical judgement   ADL Overall ADL's : Needs assistance/impaired                                       General ADL Comments: sup - Mod I with all ADLs     Vision Patient Visual Report: No change from baseline       Perception     Praxis      Pertinent Vitals/Pain Pain Assessment: No/denies pain     Hand Dominance Right   Extremity/Trunk Assessment Upper Extremity Assessment Upper Extremity Assessment: Defer to OT evaluation   Lower Extremity Assessment Lower Extremity Assessment: Defer to PT evaluation   Cervical / Trunk  Assessment Cervical / Trunk Assessment: Normal   Communication Communication Communication: No difficulties   Cognition Arousal/Alertness: Awake/alert Behavior During Therapy: WFL for tasks assessed/performed Overall Cognitive Status: Within Functional Limits for tasks assessed                                     General Comments       Exercises     Shoulder Instructions      Home Living Family/patient expects to be discharged to:: Private residence Living Arrangements: Alone Available Help at Discharge: Available PRN/intermittently Type of Home: Apartment Home Access: Other (comment) (second floor apartment)     Home Layout: One level     Bathroom Shower/Tub: Teacher, early years/pre: Standard     Home Equipment: None          Prior Functioning/Environment Level of Independence: Independent                 OT Problem List: Impaired balance (sitting and/or standing);Decreased activity tolerance  OT Treatment/Interventions:      OT Goals(Current goals can be found in the care plan section) Acute Rehab OT Goals Patient Stated Goal: to go home OT Goal Formulation: With patient  OT Frequency:     Barriers to D/C:            Co-evaluation   Reason for Co-Treatment: Other (comment) (pt has been off floor and needed evaluation) PT goals addressed during session: Mobility/safety with mobility;Balance        AM-PAC OT "6 Clicks" Daily Activity     Outcome Measure Help from another person eating meals?: None Help from another person taking care of personal grooming?: None Help from another person toileting, which includes using toliet, bedpan, or urinal?: None Help from another person bathing (including washing, rinsing, drying)?: None Help from another person to put on and taking off regular upper body clothing?: None Help from another person to put on and taking off regular lower body clothing?: None 6 Click Score:  24   End of Session Equipment Utilized During Treatment: Gait belt  Activity Tolerance: Patient tolerated treatment well Patient left: in chair  OT Visit Diagnosis: Unsteadiness on feet (R26.81)                Time: 0223-3612 OT Time Calculation (min): 23 min Charges:  OT General Charges $OT Visit: 1 Visit OT Evaluation $OT Eval Low Complexity: 1 Low   Britt Bottom 09/08/2020, 3:45 PM

## 2020-09-09 ENCOUNTER — Inpatient Hospital Stay (HOSPITAL_COMMUNITY): Payer: 59

## 2020-09-09 ENCOUNTER — Encounter (HOSPITAL_COMMUNITY): Payer: Self-pay | Admitting: Family Medicine

## 2020-09-09 LAB — HEMOGLOBIN AND HEMATOCRIT, BLOOD
HCT: 22.8 % — ABNORMAL LOW (ref 39.0–52.0)
HCT: 26.8 % — ABNORMAL LOW (ref 39.0–52.0)
HCT: 27.1 % — ABNORMAL LOW (ref 39.0–52.0)
Hemoglobin: 7.3 g/dL — ABNORMAL LOW (ref 13.0–17.0)
Hemoglobin: 8.5 g/dL — ABNORMAL LOW (ref 13.0–17.0)
Hemoglobin: 8.9 g/dL — ABNORMAL LOW (ref 13.0–17.0)

## 2020-09-09 LAB — BASIC METABOLIC PANEL WITH GFR
Anion gap: 4 — ABNORMAL LOW (ref 5–15)
BUN: 17 mg/dL (ref 8–23)
CO2: 26 mmol/L (ref 22–32)
Calcium: 8.1 mg/dL — ABNORMAL LOW (ref 8.9–10.3)
Chloride: 109 mmol/L (ref 98–111)
Creatinine, Ser: 0.98 mg/dL (ref 0.61–1.24)
GFR, Estimated: >60 mL/min
Glucose, Bld: 103 mg/dL — ABNORMAL HIGH (ref 70–99)
Potassium: 4 mmol/L (ref 3.5–5.1)
Sodium: 139 mmol/L (ref 135–145)

## 2020-09-09 LAB — PROTIME-INR
INR: 1.2 (ref 0.8–1.2)
Prothrombin Time: 15.5 seconds — ABNORMAL HIGH (ref 11.4–15.2)

## 2020-09-09 LAB — MAGNESIUM: Magnesium: 1.7 mg/dL (ref 1.7–2.4)

## 2020-09-09 MED ORDER — PEG-KCL-NACL-NASULF-NA ASC-C 100 G PO SOLR
0.5000 | Freq: Once | ORAL | Status: AC
  Administered 2020-09-09: 100 g via ORAL
  Filled 2020-09-09: qty 1

## 2020-09-09 MED ORDER — IOHEXOL 350 MG/ML SOLN
100.0000 mL | Freq: Once | INTRAVENOUS | Status: AC | PRN
  Administered 2020-09-09: 100 mL via INTRAVENOUS

## 2020-09-09 MED ORDER — SELENIUM 50 MCG PO TABS
100.0000 ug | ORAL_TABLET | Freq: Every day | ORAL | Status: DC
  Filled 2020-09-09: qty 2

## 2020-09-09 NOTE — Progress Notes (Signed)
RN spoke with [facility] about scan today. They informed RN that they are unable to due scan today due to procedure yesterday and the earliest they could do it is Monday.

## 2020-09-09 NOTE — Plan of Care (Signed)
  Problem: Education: Goal: Knowledge of General Education information will improve Description: Including pain rating scale, medication(s)/side effects and non-pharmacologic comfort measures Outcome: Progressing   Problem: Activity: Goal: Risk for activity intolerance will decrease Outcome: Progressing   

## 2020-09-09 NOTE — Plan of Care (Signed)
  Problem: Activity: Goal: Risk for activity intolerance will decrease Outcome: Progressing   Problem: Elimination: Goal: Will not experience complications related to bowel motility Outcome: Progressing   

## 2020-09-09 NOTE — Progress Notes (Signed)
PROGRESS NOTE    Seon Gaertner  PCH:[id number] DOB: 07/09/1955 DOA: 09/04/2020 PCP: Hoyt Koch, MD   Chief Complain: Weakness/dizziness  Brief Narrative: Patient is a 65 year old male with history of esophageal stenosis, A. fib on aspirin who presented with complaints of weakness, dizziness, 3 to 4 days history of melanotic stool.  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  History of several EGDs in the past for esophageal dilation for stenosis.  On presentation his hemoglobin was found to be in the range of 8, previous hemoglobin was in the range of 16.  Transfused with 2 units of PRBC on presentation, GI consulted, underwent EGD  without finding of clear source of bleeding.  Underwent video capsule study which showed normal small bowel but also showed dark melanic appearing stool in the cecum. GI following.  Plan for colonoscopy today.  His blood pressure remains soft, on IV fluids.  Plan for PT/OT evaluation tomorrow  Assessment & Plan:   Principal Problem:   Melena Active Problems:   Atrial fibrillation, chronic (HCC)   Esophageal stenosis   Anemia associated with acute blood loss   GIB (gastrointestinal bleeding)   Occult GI bleeding   GI bleed/melena/acute blood loss anemia:   -Baseline hemoglobin around 16.  Presented with hemoglobin range of 7.   -underwent EGD/video capsule/colonoscopy tagged RBC scan//angiogram without finding of clear source of bleeding.  -Hemoglobin continue to drift down , GI plan to repeat capsule study tomorrow , meckel's  scan ordered, but not able to do until Monday -Takes aspirin at home which is on hold.  Continue Protonix 40 mg daily. -Monitor hemoglobin every 8 hours , supportive transfusion , will follow GI recommendation    Hypotension/dizziness: Blood pressure remains soft.  Also complains of dizziness.  Given a bolus of normal saline 1 L today, continue fluids at 100  mL/h. Supportive PRBC transfusion as needed Reported feeling better today ,PT/OT evaluation   Paroxysmal A. fib: Currently rate is controlled.  On aspirin at home.  EKG showed normal sinus rhythm, PVCs.   Home medication propranolol on hold due to borderline hypotension Will check with GI before discharge about the duration of resumption of aspirin  History of esophageal stenosis: Currently stable        DVT prophylaxis:SCD Code Status: Full Family Communication: None Status is: Inpatient  Remains inpatient appropriate because:Inpatient level of care appropriate due to severity of illness   Dispo: The patient is from: Home              Anticipated d/c is to: Home              Patient currently is not medically stable to d/c.   Difficult to place patient No    Consultants: GI  Procedures: EGD/capsule study/colonoscopy/tagged RBC scan/angiogram  Antimicrobials:  Anti-infectives (From admission, onward)   None      Subjective: Patient seen and examined after return from angiogram He is sitting up in chair, appears comfortable He showed me a picture of his stool, there is still dark stool but it appears that there is less dark stool as well No dizziness, no abdominal pain, he ambulated in the hallway without issues    Objective: Vitals:   09/08/20 2033 09/09/20 0435 09/09/20 0744 09/09/20 1732  BP: _0 103/76  Pulse: 98 87 76 62  Resp: _1 Temp: 98.3 F (36.8 C) 97.6 F (36.4 C) 98  F (36.7 C) (!) 97.5 F (36.4 C)  TempSrc: Oral Oral Oral Oral  SpO2: 98% 100% 99% 100%  Weight:      Height:        Intake/Output Summary (Last 24 hours) at 09/09/2020 1843 Last data filed at 09/09/2020 0300 Gross per 24 hour  Intake 1010.22 ml  Output --  Net 1010.22 ml   Filed Weights   09/04/20 1644  Weight: 89 kg    Examination:   General:  NAD  Cardiovascular: RRR  Respiratory: CTABL  Abdomen: Soft/ND/NT, positive  BS  Musculoskeletal: No Edema  Neuro: alert, oriented      Data Reviewed: I have personally reviewed following labs and imaging studies  CBC: Recent Labs  Lab 09/04/20 1138 09/04/20 1839 09/05/20 0241 09/05/20 1818 09/06/20 0248 09/06/20 1444 09/07/20 0741 09/07/20 1506 09/08/20 0927 09/08/20 1409 09/08/20 2134 09/09/20 0632 09/09/20 1452  WBC 6.1  --  7.5  --  5.4  --  5.3  --   --   --   --   --   --   NEUTROABS  --   --   --   --  2.7  --  3.2  --   --   --   --   --   --   HGB 8.4*   < > 8.3*   < > 7.2*   < > 8.1*   < > 7.4* 8.4* 8.3* 7.3* 8.5*  HCT 25.5*   < > 24.3*   < > 21.3*   < > 23.5*   < > 22.1* 24.4* 25.0* 22.8* 26.8*  MCV 97.3  --  93.1  --  94.7  --  94.4  --   --   --   --   --   --   PLT 237  --  223  --  203  --  270  --   --   --   --   --   --    < > = values in this interval not displayed.   Basic Metabolic Panel: Recent Labs  Lab 09/04/20 1138 09/05/20 0241 09/08/20 0927 09/09/20 0632  NA 137 138 139 139  K 3.8 3.5 3.8 4.0  CL 107 111 107 109  CO2 _0 GLUCOSE 118* 110* 98 103*  BUN 38* 33* 17 17  CREATININE 0.92 0.97 0.98 0.98  CALCIUM 8.5* 8.1* 7.9* 8.1*  MG  --   --   --  1.7   GFR: Estimated Creatinine Clearance: 97.1 mL/min (by C-G formula based on SCr of 0.98 mg/dL). Liver Function Tests: Recent Labs  Lab 09/04/20 1138  AST 21  ALT 18  ALKPHOS 39  BILITOT 0.8  PROT 5.0*  ALBUMIN 3.1*   No results for input(s): LIPASE, AMYLASE in the last 168 hours. No results for input(s): AMMONIA in the last 168 hours. Coagulation Profile: Recent Labs  Lab 09/09/20 0632  INR 1.2   Cardiac Enzymes: No results for input(s): CKTOTAL, CKMB, CKMBINDEX, TROPONINI in the last 168 hours. BNP (last 3 results) No results for input(s): PROBNP in the last 8760 hours. HbA1C: No results for input(s): HGBA1C in the last 72 hours. CBG: No results for input(s): GLUCAP in the last 168 hours. Lipid Profile: No results for input(s):  CHOL, HDL, LDLCALC, TRIG, CHOLHDL, LDLDIRECT in the last 72 hours. Thyroid Function Tests: No results for input(s): TSH, T4TOTAL, FREET4, T3FREE, THYROIDAB in the last 72 hours. Anemia Panel: No  results for input(s): VITAMINB12, FOLATE, FERRITIN, TIBC, IRON, RETICCTPCT in the last 72 hours. Sepsis Labs: No results for input(s): PROCALCITON, LATICACIDVEN in the last 168 hours.  Recent Results (from the past 240 hour(s))  Resp Panel by RT-PCR (Flu A&B, Covid) Nasopharyngeal Swab     Status: None   Collection Time: 09/04/20 12:24 PM   Specimen: Nasopharyngeal Swab; Nasopharyngeal(NP) swabs in vial transport medium  Result Value Ref Range Status   SARS Coronavirus 2 by RT PCR NEGATIVE NEGATIVE Final    Comment: (NOTE) SARS-CoV-2 target nucleic acids are NOT DETECTED.  The SARS-CoV-2 RNA is generally detectable in upper respiratory specimens during the acute phase of infection. The lowest concentration of SARS-CoV-2 viral copies this assay can detect is 138 copies/mL. A negative result does not preclude SARS-Cov-2 infection and should not be used as the sole basis for treatment or other patient management decisions. A negative result may occur with  improper specimen collection/handling, submission of specimen other than nasopharyngeal swab, presence of viral mutation(s) within the areas targeted by this assay, and inadequate number of viral copies(<138 copies/mL). A negative result must be combined with clinical observations, patient history, and epidemiological information. The expected result is Negative.  Fact Sheet for Patients:  EntrepreneurPulse.com.au  Fact Sheet for Healthcare Providers:  IncredibleEmployment.be  This test is no t yet approved or cleared by the [place] FDA and  has been authorized for detection and/or diagnosis of SARS-CoV-2 by FDA under an Emergency Use Authorization (EUA). This EUA will remain  in effect (meaning  this test can be used) for the duration of the COVID-19 declaration under Section 564(b)(1) of the Act, 21 U.S.C.section 360bbb-3(b)(1), unless the authorization is terminated  or revoked sooner.       Influenza A by PCR NEGATIVE NEGATIVE Final   Influenza B by PCR NEGATIVE NEGATIVE Final    Comment: (NOTE) The Xpert Xpress SARS-CoV-2/FLU/RSV plus assay is intended as an aid in the diagnosis of influenza from Nasopharyngeal swab specimens and should not be used as a sole basis for treatment. Nasal washings and aspirates are unacceptable for Xpert Xpress SARS-CoV-2/FLU/RSV testing.  Fact Sheet for Patients: EntrepreneurPulse.com.au  Fact Sheet for Healthcare Providers: IncredibleEmployment.be  This test is not yet approved or cleared by the [place] FDA and has been authorized for detection and/or diagnosis of SARS-CoV-2 by FDA under an Emergency Use Authorization (EUA). This EUA will remain in effect (meaning this test can be used) for the duration of the COVID-19 declaration under Section 564(b)(1) of the Act, 21 U.S.C. section 360bbb-3(b)(1), unless the authorization is terminated or revoked.  Performed at [facility], [place] [address], [place], [place] [address]          Radiology Studies: NM GI Blood Loss  Result Date: 09/08/2020 CLINICAL DATA:  Occult GI bleed despite EGD, colonoscopy, and capsule evaluation EXAM: NUCLEAR MEDICINE GASTROINTESTINAL BLEEDING SCAN TECHNIQUE: Sequential abdominal images were obtained following intravenous administration of Tc-7mlabeled red blood cells. RADIOPHARMACEUTICALS:  23.3 mCi Tc-955mertechnetate in-vitro labeled red cells (UltraTag). COMPARISON:  None. FINDINGS: Expected blood pool distribution of labeled red cells. No abnormal gastrointestinal localization of red cells identified to suggest active GI bleeding. IMPRESSION: Negative GI bleeding study. Electronically Signed   By: MaLavonia Dana.D.   On: 09/08/2020 14:05   CT Angio Abd/Pel w/ and/or w/o  Result Date: 09/09/2020 CLINICAL DATA:  [age]ear old male with a history of occult GI bleeding EXAM: CTA ABDOMEN AND PELVIS WITHOUT AND WITH CONTRAST  TECHNIQUE: Multidetector CT imaging of the abdomen and pelvis was performed using the standard protocol during bolus administration of intravenous contrast. Multiplanar reconstructed images and MIPs were obtained and reviewed to evaluate the vascular anatomy. CONTRAST:  153m OMNIPAQUE IOHEXOL 350 MG/ML SOLN COMPARISON:  08/29/2015 FINDINGS: VASCULAR Aorta: Unremarkable course, caliber, contour of the abdominal aorta. No dissection, aneurysm, or periaortic fluid. Minimal atherosclerotic changes of the distal abdominal aorta. Celiac: Patent, with no significant atherosclerotic changes. SMA: Patent, with no significant atherosclerotic changes. Renals: - Right: Right renal artery patent. - Left: Left renal artery patent. IMA: Inferior mesenteric artery is patent. Right lower extremity: Unremarkable course, caliber, and contour of the right iliac system. No aneurysm, dissection, or occlusion. Hypogastric artery is patent. Common femoral artery patent. Proximal SFA and profunda femoris patent. Left lower extremity: Unremarkable course, caliber, and contour of the left iliac system. No aneurysm, dissection, or occlusion. Hypogastric artery is patent. Common femoral artery patent. Proximal SFA and profunda femoris patent. Veins: Unremarkable appearance of the venous system. Review of the MIP images confirms the above findings. NON-VASCULAR Lower chest: No acute. Hepatobiliary: Unremarkable appearance of the liver. Unremarkable gall bladder. Pancreas: Unremarkable. Spleen: Unremarkable. Adrenals/Urinary Tract: - Right adrenal gland: Unremarkable - Left adrenal gland: Unremarkable. - Right kidney: No hydronephrosis, nephrolithiasis, inflammation, or ureteral dilation. No focal lesion. - Left Kidney: No  hydronephrosis, nephrolithiasis, inflammation, or ureteral dilation. No focal lesion. - Urinary Bladder: Unremarkable. Stomach/Bowel: - Stomach: Unremarkable. - Small bowel: Unremarkable. There has been interval resolution of the inflammatory changes that were present on the preoperative CT for pending situs. - Appendix: Surgical changes of cholecystectomy - Colon: Mild to moderate stool burden.  No inflammatory changes. There is no evidence of acute hemorrhage within the gastrointestinal system, with no accumulation of contrast or puddling. No inflammatory changes. Lymphatic: No adenopathy. Mesenteric: No free fluid or air. No mesenteric adenopathy. Reproductive: Unremarkable appearance of the pelvic organs. Other: No hernia. Musculoskeletal: No evidence of acute fracture. No bony canal narrowing. No significant degenerative changes of the hips. IMPRESSION: CT angiogram is negative for evidence of gastrointestinal bleeding. CT also negative for identification of any possible source of GI bleeding. Surgical changes appendectomy. Aortic Atherosclerosis (ICD10-I70.0). Signed, JDulcy Fanny WDellia Nims RPVI [facility] Electronically Signed   By: JCorrie MckusickD.O.   On: 09/09/2020 13:08        Scheduled Meds: . pantoprazole  40 mg Oral Daily  . peg 3350 powder  0.5 kit Oral Once  . sodium chloride flush  3 mL Intravenous Q12H  . traZODone  50 mg Oral QHS   Continuous Infusions: . sodium chloride    . sodium chloride 100 mL/hr at 09/08/20 1650     LOS: 5 days    Time spent: 15 mins.More than 50% of that time was spent in counseling and/or coordination of care.      FFlorencia Reasons MD PhD FACP [facility] P4/15/2022, 6:43 PM

## 2020-09-09 NOTE — Progress Notes (Addendum)
25-minute phone call with patient by phone today  I discussed the case with Dr. Earleen Newport from interventional radiology regarding ongoing occult GI bleeding and the possibility of provocative angiography.  The patient has had an 11 to 12 g hemoglobin loss with this bleed.  Hemoglobin started at 16, got down to 6.  He was given 2 units of packed cells with improvement to 8 and hemoglobin is now 7 g/dL. His stool remains dark  After discussion with Dr. Earleen Newport we will pursue CT angiography of the abdomen pelvis to define mesenteric vasculature.  This may not be positive for active extravasation but will help possibly provide a target in the event of ongoing bleeding.  Meckel scan unfortunately cannot be done until Monday (I am told) but still needs to be done when possible  I am also going to repeat video capsule endoscopy while he is here  Continue to monitor hemoglobin every 12 hours, transfuse threshold of 7.0  Patient remains very patient in understanding and he understands the need to remain hospitalized given declining hemoglobin and yet to be found occult GI bleeding   Addendum-CT angiogram of the abdomen pelvis today is negative for evidence of GI bleeding, also negative for identification of any possible source of GI bleeding, status post appendectomy.  We will proceed with repeat capsule endoscopy tomorrow, bowel prep this evening.

## 2020-09-10 ENCOUNTER — Encounter (HOSPITAL_COMMUNITY)
Admission: EM | Disposition: A | Discharge status: Home / Self Care | Payer: Self-pay | Source: Home / Self Care | Attending: Internal Medicine

## 2020-09-10 HISTORY — PX: GIVENS CAPSULE STUDY: SHX5432

## 2020-09-10 LAB — BASIC METABOLIC PANEL
Anion gap: 7 (ref 5–15)
BUN: 12 mg/dL (ref 8–23)
CO2: 23 mmol/L (ref 22–32)
Calcium: 8.2 mg/dL — ABNORMAL LOW (ref 8.9–10.3)
Chloride: 108 mmol/L (ref 98–111)
Creatinine, Ser: 0.92 mg/dL (ref 0.61–1.24)
GFR, Estimated: >60 mL/min (ref >60)
Glucose, Bld: 88 mg/dL (ref 70–99)
Potassium: 3.8 mmol/L (ref 3.5–5.1)
Sodium: 138 mmol/L (ref 135–145)

## 2020-09-10 LAB — TYPE AND SCREEN
ABO/RH(D): A NEG
Antibody Screen: NEGATIVE
Unit division: 0
Unit division: 0

## 2020-09-10 LAB — BPAM RBC
Blood Product Expiration Date: 202205062359
Blood Product Expiration Date: 202205072359
ISSUE DATE / TIME: 202204140229
ISSUE DATE / TIME: 202204140458
Unit Type and Rh: 600
Unit Type and Rh: 600

## 2020-09-10 LAB — HEMOGLOBIN AND HEMATOCRIT, BLOOD
HCT: 22.7 % — ABNORMAL LOW (ref 39.0–52.0)
HCT: 25.7 % — ABNORMAL LOW (ref 39.0–52.0)
Hemoglobin: 7.5 g/dL — ABNORMAL LOW (ref 13.0–17.0)
Hemoglobin: 8.4 g/dL — ABNORMAL LOW (ref 13.0–17.0)

## 2020-09-10 LAB — MAGNESIUM: Magnesium: 1.7 mg/dL (ref 1.7–2.4)

## 2020-09-10 SURGERY — IMAGING PROCEDURE, GI TRACT, INTRALUMINAL, VIA CAPSULE
Anesthesia: LOCAL

## 2020-09-10 MED ORDER — MAGNESIUM SULFATE 2 GM/50ML IV SOLN
2.0000 g | Freq: Once | INTRAVENOUS | Status: AC
  Administered 2020-09-10: 2 g via INTRAVENOUS
  Filled 2020-09-10: qty 50

## 2020-09-10 SURGICAL SUPPLY — 1 items: TOWEL COTTON PACK 4EA (MISCELLANEOUS) ×4 IMPLANT

## 2020-09-10 NOTE — Plan of Care (Signed)
  Problem: Education: Goal: Knowledge of General Education information will improve Description: Including pain rating scale, medication(s)/side effects and non-pharmacologic comfort measures Outcome: Progressing   Problem: Health Behavior/Discharge Planning: Goal: Ability to manage health-related needs will improve Outcome: Progressing   Problem: Activity: Goal: Risk for activity intolerance will decrease Outcome: Progressing   Problem: Coping: Goal: Level of anxiety will decrease Outcome: Progressing   Problem: Safety: Goal: Ability to remain free from injury will improve Outcome: Progressing   Problem: Skin Integrity: Goal: Risk for impaired skin integrity will decrease Outcome: Progressing  Patient has completed 1L of MoviPrep. NPO has maintained after midnight. Pt with several brown loose stools after the prep.

## 2020-09-10 NOTE — Progress Notes (Signed)
RN set up capsule recorder and attached leads to patient abdominal region according to instructions.  RN paired capsule to recorder device.  Patient successfully swallowed the capsule at 1115.  RN reviewed instructions regarding eating and drinking.  Endoscopy RN provided same instructions to patient's floor RN who stated understanding.  Endoscopy RN instructed patient's RN about timing of lead removal and bagging of all supplies for endoscopy RN to pick up tomorrow morning.

## 2020-09-10 NOTE — Progress Notes (Signed)
PROGRESS NOTE    Saman Umstead  JAS:[id number] DOB: 1955-06-22 DOA: 09/04/2020 PCP: Hoyt Koch, MD   Chief Complain: Weakness/dizziness  Brief Narrative: Patient is a 65 year old male with history of esophageal stenosis, A. fib on aspirin who presented with complaints of weakness, dizziness, 3 to 4 days history of melanotic stool.  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  History of several EGDs in the past for esophageal dilation for stenosis.  On presentation his hemoglobin was found to be in the range of 8, previous hemoglobin was in the range of 16.  Transfused with 2 units of PRBC on presentation, GI consulted, underwent EGD  without finding of clear source of bleeding.  Underwent video capsule study which showed normal small bowel but also showed dark melanic appearing stool in the cecum. GI following.  Plan for colonoscopy today.  His blood pressure remains soft, on IV fluids.  Plan for PT/OT evaluation tomorrow  Assessment & Plan:   Principal Problem:   Melena Active Problems:   Atrial fibrillation, chronic (HCC)   Esophageal stenosis   Anemia associated with acute blood loss   GIB (gastrointestinal bleeding)   Occult GI bleeding   GI bleed/melena/acute blood loss anemia:   -Baseline hemoglobin around 16.  Presented with hemoglobin range of 7.   -underwent EGD/video capsule/colonoscopy tagged RBC scan//angiogram without finding of clear source of bleeding.  -Hemoglobin continue to fluctuating  ,  repeat capsule study  On 4/18 , may need  Outpatient meckel's  Scan. -Takes aspirin at home which is on hold.  Currently on  Protonix 40 mg daily. -Monitor hemoglobin every 12 hours , supportive transfusion , will follow GI recommendation    Hypotension/dizziness: Blood pressure remains soft.  Also complains of dizziness.  Given a bolus of normal saline 1 L today, continue fluids at 100 mL/h. Supportive PRBC transfusion  as needed Reported feeling better and ambulating in hallway ,PT/OT evaluation   Paroxysmal A. fib: Currently rate is controlled.  On aspirin at home.  EKG showed normal sinus rhythm, PVCs.   Home medication propranolol on hold due to borderline hypotension Will check with GI before discharge about the duration of resumption of aspirin  History of esophageal stenosis: Currently stable      DVT prophylaxis:SCD Code Status: Full Family Communication: None Status is: Inpatient  Remains inpatient appropriate because:Inpatient level of care appropriate due to severity of illness    Dispo: The patient is from: Home              Anticipated d/c is to: Home, possibly tomorrow if clears by GI                 Consultants: GI  Procedures: EGD/capsule study/colonoscopy/tagged RBC scan/angiogram  Antimicrobials:  Anti-infectives (From admission, onward)   None      Subjective:  He is sitting up in bed,  appears comfortable Reports stool now is completely brown No dizziness, no abdominal pain, he ambulated in the hallway without issues    Objective: Vitals:   09/09/20 1732 09/09/20 1952 09/10/20 0617 09/10/20 0836  BP: 103/76 96/62 (!) 102/58 109/73  Pulse: 62 68 83 82  Resp: 16   16  Temp: (!) 97.5 F (36.4 C) 98.7 F (37.1 C) (!) 97.5 F (36.4 C) 97.6 F (36.4 C)  TempSrc: Oral Oral Oral Oral  SpO2: 100% 100% 97% 100%  Weight:      Height:  No intake or output data in the 24 hours ending 09/10/20 1150 Filed Weights   09/04/20 1644  Weight: 89 kg    Examination:   General:  NAD  Cardiovascular: RRR  Respiratory: CTABL  Abdomen: Soft/ND/NT, positive BS  Musculoskeletal: No Edema  Neuro: alert, oriented      Data Reviewed: I have personally reviewed following labs and imaging studies  CBC: Recent Labs  Lab 09/04/20 1138 09/04/20 1839 09/05/20 0241 09/05/20 1818 09/06/20 0248 09/06/20 1444 09/07/20 0741 09/07/20 1506 09/08/20 2134  09/09/20 0632 09/09/20 1452 09/09/20 2223 09/10/20 0607  WBC 6.1  --  7.5  --  5.4  --  5.3  --   --   --   --   --   --   NEUTROABS  --   --   --   --  2.7  --  3.2  --   --   --   --   --   --   HGB 8.4*   < > 8.3*   < > 7.2*   < > 8.1*   < > 8.3* 7.3* 8.5* 8.9* 7.5*  HCT 25.5*   < > 24.3*   < > 21.3*   < > 23.5*   < > 25.0* 22.8* 26.8* 27.1* 22.7*  MCV 97.3  --  93.1  --  94.7  --  94.4  --   --   --   --   --   --   PLT 237  --  223  --  203  --  270  --   --   --   --   --   --    < > = values in this interval not displayed.   Basic Metabolic Panel: Recent Labs  Lab 09/04/20 1138 09/05/20 0241 09/08/20 0927 09/09/20 0632 09/10/20 0607  NA 137 138 139 139 138  K 3.8 3.5 3.8 4.0 3.8  CL 107 111 107 109 108  CO2 23 24 28 26 23   GLUCOSE 118* 110* 98 103* 88  BUN 38* 33* 17 17 12   CREATININE 0.92 0.97 0.98 0.98 0.92  CALCIUM 8.5* 8.1* 7.9* 8.1* 8.2*  MG  --   --   --  1.7 1.7   GFR: Estimated Creatinine Clearance: 103.5 mL/min (by C-G formula based on SCr of 0.92 mg/dL). Liver Function Tests: Recent Labs  Lab 09/04/20 1138  AST 21  ALT 18  ALKPHOS 39  BILITOT 0.8  PROT 5.0*  ALBUMIN 3.1*   No results for input(s): LIPASE, AMYLASE in the last 168 hours. No results for input(s): AMMONIA in the last 168 hours. Coagulation Profile: Recent Labs  Lab 09/09/20 0632  INR 1.2   Cardiac Enzymes: No results for input(s): CKTOTAL, CKMB, CKMBINDEX, TROPONINI in the last 168 hours. BNP (last 3 results) No results for input(s): PROBNP in the last 8760 hours. HbA1C: No results for input(s): HGBA1C in the last 72 hours. CBG: No results for input(s): GLUCAP in the last 168 hours. Lipid Profile: No results for input(s): CHOL, HDL, LDLCALC, TRIG, CHOLHDL, LDLDIRECT in the last 72 hours. Thyroid Function Tests: No results for input(s): TSH, T4TOTAL, FREET4, T3FREE, THYROIDAB in the last 72 hours. Anemia Panel: No results for input(s): VITAMINB12, FOLATE, FERRITIN, TIBC,  IRON, RETICCTPCT in the last 72 hours. Sepsis Labs: No results for input(s): PROCALCITON, LATICACIDVEN in the last 168 hours.  Recent Results (from the past 240 hour(s))  Resp Panel by RT-PCR (Flu A&B, Covid)  Nasopharyngeal Swab     Status: None   Collection Time: 09/04/20 12:24 PM   Specimen: Nasopharyngeal Swab; Nasopharyngeal(NP) swabs in vial transport medium  Result Value Ref Range Status   SARS Coronavirus 2 by RT PCR NEGATIVE NEGATIVE Final    Comment: (NOTE) SARS-CoV-2 target nucleic acids are NOT DETECTED.  The SARS-CoV-2 RNA is generally detectable in upper respiratory specimens during the acute phase of infection. The lowest concentration of SARS-CoV-2 viral copies this assay can detect is 138 copies/mL. A negative result does not preclude SARS-Cov-2 infection and should not be used as the sole basis for treatment or other patient management decisions. A negative result may occur with  improper specimen collection/handling, submission of specimen other than nasopharyngeal swab, presence of viral mutation(s) within the areas targeted by this assay, and inadequate number of viral copies(<138 copies/mL). A negative result must be combined with clinical observations, patient history, and epidemiological information. The expected result is Negative.  Fact Sheet for Patients:  EntrepreneurPulse.com.au  Fact Sheet for Healthcare Providers:  IncredibleEmployment.be  This test is no t yet approved or cleared by the [place] FDA and  has been authorized for detection and/or diagnosis of SARS-CoV-2 by FDA under an Emergency Use Authorization (EUA). This EUA will remain  in effect (meaning this test can be used) for the duration of the COVID-19 declaration under Section 564(b)(1) of the Act, 21 U.S.C.section 360bbb-3(b)(1), unless the authorization is terminated  or revoked sooner.       Influenza A by PCR NEGATIVE NEGATIVE Final    Influenza B by PCR NEGATIVE NEGATIVE Final    Comment: (NOTE) The Xpert Xpress SARS-CoV-2/FLU/RSV plus assay is intended as an aid in the diagnosis of influenza from Nasopharyngeal swab specimens and should not be used as a sole basis for treatment. Nasal washings and aspirates are unacceptable for Xpert Xpress SARS-CoV-2/FLU/RSV testing.  Fact Sheet for Patients: EntrepreneurPulse.com.au  Fact Sheet for Healthcare Providers: IncredibleEmployment.be  This test is not yet approved or cleared by the [place] FDA and has been authorized for detection and/or diagnosis of SARS-CoV-2 by FDA under an Emergency Use Authorization (EUA). This EUA will remain in effect (meaning this test can be used) for the duration of the COVID-19 declaration under Section 564(b)(1) of the Act, 21 U.S.C. section 360bbb-3(b)(1), unless the authorization is terminated or revoked.  Performed at [facility], [place] [address], [place], [place] [address]          Radiology Studies: NM GI Blood Loss  Result Date: 09/08/2020 CLINICAL DATA:  Occult GI bleed despite EGD, colonoscopy, and capsule evaluation EXAM: NUCLEAR MEDICINE GASTROINTESTINAL BLEEDING SCAN TECHNIQUE: Sequential abdominal images were obtained following intravenous administration of Tc-12m labeled red blood cells. RADIOPHARMACEUTICALS:  23.3 mCi Tc-24m pertechnetate in-vitro labeled red cells (UltraTag). COMPARISON:  None. FINDINGS: Expected blood pool distribution of labeled red cells. No abnormal gastrointestinal localization of red cells identified to suggest active GI bleeding. IMPRESSION: Negative GI bleeding study. Electronically Signed   By: Lavonia Dana M.D.   On: 09/08/2020 14:05   CT Angio Abd/Pel w/ and/or w/o  Result Date: 09/09/2020 CLINICAL DATA:  65 year old male with a history of occult GI bleeding EXAM: CTA ABDOMEN AND PELVIS WITHOUT AND WITH CONTRAST TECHNIQUE: Multidetector CT imaging  of the abdomen and pelvis was performed using the standard protocol during bolus administration of intravenous contrast. Multiplanar reconstructed images and MIPs were obtained and reviewed to evaluate the vascular anatomy. CONTRAST:  174mL OMNIPAQUE IOHEXOL 350 MG/ML SOLN  COMPARISON:  08/29/2015 FINDINGS: VASCULAR Aorta: Unremarkable course, caliber, contour of the abdominal aorta. No dissection, aneurysm, or periaortic fluid. Minimal atherosclerotic changes of the distal abdominal aorta. Celiac: Patent, with no significant atherosclerotic changes. SMA: Patent, with no significant atherosclerotic changes. Renals: - Right: Right renal artery patent. - Left: Left renal artery patent. IMA: Inferior mesenteric artery is patent. Right lower extremity: Unremarkable course, caliber, and contour of the right iliac system. No aneurysm, dissection, or occlusion. Hypogastric artery is patent. Common femoral artery patent. Proximal SFA and profunda femoris patent. Left lower extremity: Unremarkable course, caliber, and contour of the left iliac system. No aneurysm, dissection, or occlusion. Hypogastric artery is patent. Common femoral artery patent. Proximal SFA and profunda femoris patent. Veins: Unremarkable appearance of the venous system. Review of the MIP images confirms the above findings. NON-VASCULAR Lower chest: No acute. Hepatobiliary: Unremarkable appearance of the liver. Unremarkable gall bladder. Pancreas: Unremarkable. Spleen: Unremarkable. Adrenals/Urinary Tract: - Right adrenal gland: Unremarkable - Left adrenal gland: Unremarkable. - Right kidney: No hydronephrosis, nephrolithiasis, inflammation, or ureteral dilation. No focal lesion. - Left Kidney: No hydronephrosis, nephrolithiasis, inflammation, or ureteral dilation. No focal lesion. - Urinary Bladder: Unremarkable. Stomach/Bowel: - Stomach: Unremarkable. - Small bowel: Unremarkable. There has been interval resolution of the inflammatory changes that were  present on the preoperative CT for pending situs. - Appendix: Surgical changes of cholecystectomy - Colon: Mild to moderate stool burden.  No inflammatory changes. There is no evidence of acute hemorrhage within the gastrointestinal system, with no accumulation of contrast or puddling. No inflammatory changes. Lymphatic: No adenopathy. Mesenteric: No free fluid or air. No mesenteric adenopathy. Reproductive: Unremarkable appearance of the pelvic organs. Other: No hernia. Musculoskeletal: No evidence of acute fracture. No bony canal narrowing. No significant degenerative changes of the hips. IMPRESSION: CT angiogram is negative for evidence of gastrointestinal bleeding. CT also negative for identification of any possible source of GI bleeding. Surgical changes appendectomy. Aortic Atherosclerosis (ICD10-I70.0). Signed, Dulcy Fanny. Dellia Nims, RPVI [facility] Radiology Electronically Signed   By: Corrie Mckusick D.O.   On: 09/09/2020 13:08        Scheduled Meds: . pantoprazole  40 mg Oral Daily  . sodium chloride flush  3 mL Intravenous Q12H  . traZODone  50 mg Oral QHS   Continuous Infusions: . sodium chloride    . sodium chloride 100 mL/hr at 09/08/20 1650  . magnesium sulfate bolus IVPB       LOS: 6 days    Time spent: 15 mins.More than 50% of that time was spent in counseling and/or coordination of care.      Florencia Reasons, MD PhD FACP [facility] P4/16/2022, 11:50 AM

## 2020-09-10 NOTE — Progress Notes (Signed)
Progress Note   Subjective  He is feeling well Stools have returned completely to brown with no further melena No abdominal pain Energy levels a bit better He was a little confused by the hemoglobin of 7.5 this morning after having been as high as 8.9 yesterday Video capsule endoscopy ongoing   Objective  Vital signs in last 24 hours: Temp:  [97.5 F (36.4 C)-98.7 F (37.1 C)] 97.6 F (36.4 C) (04/16 1333) Pulse Rate:  [62-83] 82 (04/16 1333) Resp:  [16-20] 20 (04/16 1333) BP: (96-109)/(58-76) 102/61 (04/16 1333) SpO2:  [97 %-100 %] 98 % (04/16 1333) Last BM Date: 09/09/20  General: Alert, well-developed, in NAD Heart:  Regular rate and rhythm; no murmurs Chest: Clear to ascultation bilaterally Abdomen:  Soft, nontender and nondistended. Normal bowel sounds, without guarding, and without rebound.   Extremities:  Without edema. Neurologic:  Alert and  oriented x4; grossly normal neurologically. Psych:  Alert and cooperative. Normal mood and affect.  Intake/Output from previous day: No intake/output data recorded. Intake/Output this shift: Total I/O In: 240 [P.O.:240] Out: -   Lab Results: Recent Labs    09/09/20 1452 09/09/20 2223 09/10/20 0607  HGB 8.5* 8.9* 7.5*  HCT 26.8* 27.1* 22.7*   BMET Recent Labs    09/08/20 0927 09/09/20 0632 09/10/20 0607  NA 139 139 138  K 3.8 4.0 3.8  CL 107 109 108  CO2 28 26 23   GLUCOSE 98 103* 88  BUN 17 17 12   CREATININE 0.98 0.98 0.92  CALCIUM 7.9* 8.1* 8.2*   LFT No results for input(s): PROT, ALBUMIN, AST, ALT, ALKPHOS, BILITOT, BILIDIR, IBILI in the last 72 hours. PT/INR Recent Labs    09/09/20 0632  LABPROT 15.5*  INR 1.2   Hepatitis Panel No results for input(s): HEPBSAG, HCVAB, HEPAIGM, HEPBIGM in the last 72 hours.  Studies/Results: CT Angio Abd/Pel w/ and/or w/o  Result Date: 09/09/2020 CLINICAL DATA:  65 year old male with a history of occult GI bleeding EXAM: CTA ABDOMEN AND PELVIS WITHOUT  AND WITH CONTRAST TECHNIQUE: Multidetector CT imaging of the abdomen and pelvis was performed using the standard protocol during bolus administration of intravenous contrast. Multiplanar reconstructed images and MIPs were obtained and reviewed to evaluate the vascular anatomy. CONTRAST:  183mL OMNIPAQUE IOHEXOL 350 MG/ML SOLN COMPARISON:  08/29/2015 FINDINGS: VASCULAR Aorta: Unremarkable course, caliber, contour of the abdominal aorta. No dissection, aneurysm, or periaortic fluid. Minimal atherosclerotic changes of the distal abdominal aorta. Celiac: Patent, with no significant atherosclerotic changes. SMA: Patent, with no significant atherosclerotic changes. Renals: - Right: Right renal artery patent. - Left: Left renal artery patent. IMA: Inferior mesenteric artery is patent. Right lower extremity: Unremarkable course, caliber, and contour of the right iliac system. No aneurysm, dissection, or occlusion. Hypogastric artery is patent. Common femoral artery patent. Proximal SFA and profunda femoris patent. Left lower extremity: Unremarkable course, caliber, and contour of the left iliac system. No aneurysm, dissection, or occlusion. Hypogastric artery is patent. Common femoral artery patent. Proximal SFA and profunda femoris patent. Veins: Unremarkable appearance of the venous system. Review of the MIP images confirms the above findings. NON-VASCULAR Lower chest: No acute. Hepatobiliary: Unremarkable appearance of the liver. Unremarkable gall bladder. Pancreas: Unremarkable. Spleen: Unremarkable. Adrenals/Urinary Tract: - Right adrenal gland: Unremarkable - Left adrenal gland: Unremarkable. - Right kidney: No hydronephrosis, nephrolithiasis, inflammation, or ureteral dilation. No focal lesion. - Left Kidney: No hydronephrosis, nephrolithiasis, inflammation, or ureteral dilation. No focal lesion. - Urinary Bladder: Unremarkable. Stomach/Bowel: - Stomach: Unremarkable. -  Small bowel: Unremarkable. There has been  interval resolution of the inflammatory changes that were present on the preoperative CT for pending situs. - Appendix: Surgical changes of cholecystectomy - Colon: Mild to moderate stool burden.  No inflammatory changes. There is no evidence of acute hemorrhage within the gastrointestinal system, with no accumulation of contrast or puddling. No inflammatory changes. Lymphatic: No adenopathy. Mesenteric: No free fluid or air. No mesenteric adenopathy. Reproductive: Unremarkable appearance of the pelvic organs. Other: No hernia. Musculoskeletal: No evidence of acute fracture. No bony canal narrowing. No significant degenerative changes of the hips. IMPRESSION: CT angiogram is negative for evidence of gastrointestinal bleeding. CT also negative for identification of any possible source of GI bleeding. Surgical changes appendectomy. Aortic Atherosclerosis (ICD10-I70.0). Signed, Dulcy Fanny. Dellia Nims, RPVI [facility] Radiology Electronically Signed   By: Corrie Mckusick D.O.   On: 09/09/2020 13:08      Assessment & Recommendations  65 year old male with occult GI bleed  1.  Occult GI bleeding --fortunately his stools have returned to brown and the melena has stopped.  This is definitively a positive sign.  I am not convinced that the hemoglobin drop to 7.5 is correct.  It also had dropped yesterday but bounced back to the mid 8 range without transfusion.  His only transfusion was 2 units on 09/08/2020.  To this point upper endoscopy, capsule endoscopy, and colonoscopy unrevealing for source of bleed.  Tagged red cell scan and CT angiography also negative. --Second capsule endoscopy ongoing now --Continue to monitor hemoglobin every 12 hours, query if last reading was spurious --If hemoglobin improves tomorrow and capsule negative may be able to go home tomorrow.  He is very aware that he would need to come back if any recurrent melena or red blood per rectum --I  would still recommend outpatient Meckel's scan if he goes home tomorrow versus inpatient if he remains hospitalized on Monday  2.  Paroxysmal atrial fibrillation --he had been on a full dose aspirin which was a relatively recent change for him from an 81 mg aspirin.  He has questions regarding reducing the dose which I think is advisable but I would hold aspirin altogether for another week given the significant blood loss that he has had this week.  He should speak to his cardiologist about the long-term risk benefit of aspirin.  He has not been in A. fib here.  His chads 2 score would be low.  35 min spent today        LOS: 6 days   Jerene Bears  09/10/2020, 2:38 PM [phone]

## 2020-09-11 ENCOUNTER — Encounter (HOSPITAL_COMMUNITY): Payer: Self-pay | Admitting: Internal Medicine

## 2020-09-11 LAB — BASIC METABOLIC PANEL
Anion gap: 6 (ref 5–15)
BUN: 19 mg/dL (ref 8–23)
CO2: 26 mmol/L (ref 22–32)
Calcium: 8.7 mg/dL — ABNORMAL LOW (ref 8.9–10.3)
Chloride: 107 mmol/L (ref 98–111)
Creatinine, Ser: 0.97 mg/dL (ref 0.61–1.24)
GFR, Estimated: >60 mL/min (ref >60)
Glucose, Bld: 102 mg/dL — ABNORMAL HIGH (ref 70–99)
Potassium: 3.9 mmol/L (ref 3.5–5.1)
Sodium: 139 mmol/L (ref 135–145)

## 2020-09-11 LAB — MAGNESIUM: Magnesium: 2 mg/dL (ref 1.7–2.4)

## 2020-09-11 LAB — HEMOGLOBIN AND HEMATOCRIT, BLOOD
HCT: 26.1 % — ABNORMAL LOW (ref 39.0–52.0)
Hemoglobin: 8.5 g/dL — ABNORMAL LOW (ref 13.0–17.0)

## 2020-09-11 MED ORDER — PROPRANOLOL HCL 20 MG PO TABS: 20.0000 mg | ORAL_TABLET | Freq: Two times a day (BID) | ORAL | Status: DC | PRN

## 2020-09-11 NOTE — Progress Notes (Signed)
Patient inquired about when Nuclear Medicine will come to pick up the monitor> I called and received no answer will inform day shift to call to assist with this issue. Arthor Captain LPN

## 2020-09-11 NOTE — Plan of Care (Signed)
  Problem: Education: Goal: Knowledge of General Education information will improve Description: Including pain rating scale, medication(s)/side effects and non-pharmacologic comfort measures Outcome: Adequate for Discharge   Problem: Health Behavior/Discharge Planning: Goal: Ability to manage health-related needs will improve Outcome: Adequate for Discharge   Problem: Clinical Measurements: Goal: Ability to maintain clinical measurements within normal limits will improve Outcome: Adequate for Discharge Goal: Will remain free from infection Outcome: Adequate for Discharge Goal: Diagnostic test results will improve Outcome: Adequate for Discharge Goal: Respiratory complications will improve Outcome: Adequate for Discharge   Problem: Elimination: Goal: Will not experience complications related to bowel motility Outcome: Adequate for Discharge

## 2020-09-11 NOTE — Discharge Summary (Addendum)
Discharge Summary  Jose Holden TKW:[id number] DOB: 1955/09/10  PCP: Hoyt Koch, MD  Admit date: 09/04/2020 Discharge date: 09/11/2020  Time spent:  43mins  Recommendations for Outpatient Follow-up:  1. F/u with PCP within a week  for hospital discharge follow up, repeat cbc/bmp at follow up 2. Outpatient referral to cardiology for event monitoring to assess afib burden, outpatient cardiology follow up for afib management 3. GI follow up next week    Discharge Diagnoses:  Active Hospital Problems   Diagnosis Date Noted  . Melena 09/04/2020  . Occult GI bleeding   . Anemia associated with acute blood loss 09/04/2020  . GIB (gastrointestinal bleeding) 09/04/2020  . Esophageal stenosis 02/27/2018  . Atrial fibrillation, chronic ([place]) 02/13/2018    Resolved Hospital Problems  No resolved problems to display.    Discharge Condition: stable  Diet recommendation: heart healthy  Filed Weights   09/04/20 1644  Weight: 89 kg    History of present illness: (per admitting MD Dr Shanon Brow) Chief Complaint: Weakness and dizziness  HPI: Jose Holden is a 65 y.o. male with medical history significant of esophageal stenosis, A. fib on aspirin comes in with 3 to 4 days of melanotic stool.  Patient denies any chronic heartburn symptoms.  He denies any NSAID usage such as BC powders or Goody powders over-the-counter he does take a daily aspirin for his A. fib.  He recently had a colonoscopy a year ago which removed 3 polyps which were premalignant.  He has had 6 to EGDs in the past which required esophageal dilation due to stenosis present.  He does not chronically take a PPI IR has never been told he needs to take a chronic PPI.  He denies any nausea vomiting.  He does have some dysphagia symptoms which she reports are his normal and is been no worse than normal.  He denies any vomiting.  Patient found to have a hemoglobin of 8 dropped from 16 several months ago.  He denies any  abdominal pain.  He is currently being transfused 2 units of packed red blood cells in the emergency department and referred for admission for work-up for melanotic stool.  GI service has been called for consultation.   Hospital Course:  Principal Problem:   Melena Active Problems:   Atrial fibrillation, chronic (HCC)   Esophageal stenosis   Anemia associated with acute blood loss   GIB (gastrointestinal bleeding)   Occult GI bleeding   GI bleed/melena/acute blood loss anemia:   -Baseline hemoglobin around 16.  hgb dropped to 6.4 oon 4/13   -underwent EGD/video capsule/colonoscopy tagged RBC scan//angiogram without finding of clear source of bleeding.  - repeat capsule study  On 4/18 no acute bleeding , may need  Outpatient meckel's  Scan. -Takes aspirin 325 at home which is on hold.   - on Protonix 40 mg daily in the hospital, GI does not recommend long term ppi. -He is cleared by GI to discharge home with outpatient GI follow up    Hypotension/dizziness, present on admission Resolved after prbc transfusion and ivf  ambulating in hallway without difficulty   Paroxysmal A. fib:  He did not remember when and who diagnosed him , but he has been on asa 325 for stroke prevention , he does reports palpitation and feeling funny headed intermittently EKG showed normal sinus rhythm, PVCs.   He reports use  Propranolol prn for situation anxiety , he did not received any propranolol in the hospital  Cardiology referral  for outpatient cardiac monitor and cardiology follow up requested  History of esophageal stenosis: Currently stable  DVT prophylaxis:SCD Code Status: Full Family Communication: None  Consultants: GI  Procedures: EGD/capsule study/colonoscopy/tagged RBC scan/angiogram   Discharge Exam: BP 105/62 (BP Location: Right Arm)   Pulse 69   Temp 97.7 F (36.5 C) (Oral)   Resp 18   Ht 6\' 7"  (2.007 m)   Wt 89 kg   SpO2 100%   BMI 22.10 kg/m   General:  NAD Cardiovascular: RRR Respiratory: normal respiratory effort   Discharge Instructions You were cared for by a hospitalist during your hospital stay. If you have any questions about your discharge medications or the care you received while you were in the hospital after you are discharged, you can call the unit and asked to speak with the hospitalist on call if the hospitalist that took care of you is not available. Once you are discharged, your primary care physician will handle any further medical issues. Please note that NO REFILLS for any discharge medications will be authorized once you are discharged, as it is imperative that you return to your primary care physician (or establish a relationship with a primary care physician if you do not have one) for your aftercare needs so that they can reassess your need for medications and monitor your lab values.  Discharge Instructions    Diet general   Complete by: As directed    Increase activity slowly   Complete by: As directed      Allergies as of 09/11/2020   No Known Allergies     Medication List    STOP taking these medications   aspirin 325 MG tablet   betamethasone valerate ointment 0.1 % Commonly known as: VALISONE     TAKE these medications   propranolol 20 MG tablet Commonly known as: INDERAL Take 1 tablet (20 mg total) by mouth 2 (two) times daily as needed (for performance-induced stress).   SELENIUM PO Take 1 tablet by mouth daily with breakfast.   sildenafil 20 MG tablet Commonly known as: REVATIO Take 1-5 tablets (20-100 mg total) by mouth daily as needed (erection). What changed: reasons to take this   SPIRULINA PO Take 1 tablet by mouth daily with breakfast.   vardenafil 20 MG tablet Commonly known as: Levitra Take 1 tablet (20 mg total) by mouth daily as needed for erectile dysfunction.   VITAMIN D-3 PO Take 1 tablet by mouth daily with breakfast.      No Known Allergies  Follow-up Information     Hoyt Koch, MD Follow up in 1 week(s).   Specialty: Internal Medicine Why: for hospital discharge follow up, pcp to decide on asa resumption.  Contact information: [place] [address] [phone]        Jerene Bears, MD Follow up.   Specialty: Gastroenterology Contact information: [address] [place] [address] [phone]        [facility] Follow up.   Specialty: Cardiology Why: for outpatient cardiac monitoring for atrial fibrillation  Contact information: [address], [place] [phone]               The results of significant diagnostics from this hospitalization (including imaging, microbiology, ancillary and laboratory) are listed below for reference.    Significant Diagnostic Studies: NM GI Blood Loss  Result Date: 09/08/2020 CLINICAL DATA:  Occult GI bleed despite EGD, colonoscopy, and  capsule evaluation EXAM: NUCLEAR MEDICINE GASTROINTESTINAL BLEEDING SCAN TECHNIQUE: Sequential abdominal images were obtained following intravenous administration of Tc-37m labeled red blood cells. RADIOPHARMACEUTICALS:  23.3 mCi Tc-15m pertechnetate in-vitro labeled red cells (UltraTag). COMPARISON:  None. FINDINGS: Expected blood pool distribution of labeled red cells. No abnormal gastrointestinal localization of red cells identified to suggest active GI bleeding. IMPRESSION: Negative GI bleeding study. Electronically Signed   By: Lavonia Dana M.D.   On: 09/08/2020 14:05   CT Angio Abd/Pel w/ and/or w/o  Result Date: 09/09/2020 CLINICAL DATA:  65 year old male with a history of occult GI bleeding EXAM: CTA ABDOMEN AND PELVIS WITHOUT AND WITH CONTRAST TECHNIQUE: Multidetector CT imaging of the abdomen and pelvis was performed using the standard protocol during bolus administration of intravenous contrast. Multiplanar reconstructed images and MIPs were obtained and reviewed to  evaluate the vascular anatomy. CONTRAST:  171mL OMNIPAQUE IOHEXOL 350 MG/ML SOLN COMPARISON:  08/29/2015 FINDINGS: VASCULAR Aorta: Unremarkable course, caliber, contour of the abdominal aorta. No dissection, aneurysm, or periaortic fluid. Minimal atherosclerotic changes of the distal abdominal aorta. Celiac: Patent, with no significant atherosclerotic changes. SMA: Patent, with no significant atherosclerotic changes. Renals: - Right: Right renal artery patent. - Left: Left renal artery patent. IMA: Inferior mesenteric artery is patent. Right lower extremity: Unremarkable course, caliber, and contour of the right iliac system. No aneurysm, dissection, or occlusion. Hypogastric artery is patent. Common femoral artery patent. Proximal SFA and profunda femoris patent. Left lower extremity: Unremarkable course, caliber, and contour of the left iliac system. No aneurysm, dissection, or occlusion. Hypogastric artery is patent. Common femoral artery patent. Proximal SFA and profunda femoris patent. Veins: Unremarkable appearance of the venous system. Review of the MIP images confirms the above findings. NON-VASCULAR Lower chest: No acute. Hepatobiliary: Unremarkable appearance of the liver. Unremarkable gall bladder. Pancreas: Unremarkable. Spleen: Unremarkable. Adrenals/Urinary Tract: - Right adrenal gland: Unremarkable - Left adrenal gland: Unremarkable. - Right kidney: No hydronephrosis, nephrolithiasis, inflammation, or ureteral dilation. No focal lesion. - Left Kidney: No hydronephrosis, nephrolithiasis, inflammation, or ureteral dilation. No focal lesion. - Urinary Bladder: Unremarkable. Stomach/Bowel: - Stomach: Unremarkable. - Small bowel: Unremarkable. There has been interval resolution of the inflammatory changes that were present on the preoperative CT for pending situs. - Appendix: Surgical changes of cholecystectomy - Colon: Mild to moderate stool burden.  No inflammatory changes. There is no evidence of acute  hemorrhage within the gastrointestinal system, with no accumulation of contrast or puddling. No inflammatory changes. Lymphatic: No adenopathy. Mesenteric: No free fluid or air. No mesenteric adenopathy. Reproductive: Unremarkable appearance of the pelvic organs. Other: No hernia. Musculoskeletal: No evidence of acute fracture. No bony canal narrowing. No significant degenerative changes of the hips. IMPRESSION: CT angiogram is negative for evidence of gastrointestinal bleeding. CT also negative for identification of any possible source of GI bleeding. Surgical changes appendectomy. Aortic Atherosclerosis (ICD10-I70.0). Signed, Dulcy Fanny. Dellia Nims, RPVI [facility] Electronically Signed   By: Corrie Mckusick D.O.   On: 09/09/2020 13:08    Microbiology: Recent Results (from the past 240 hour(s))  Resp Panel by RT-PCR (Flu A&B, Covid) Nasopharyngeal Swab     Status: None   Collection Time: 09/04/20 12:24 PM   Specimen: Nasopharyngeal Swab; Nasopharyngeal(NP) swabs in vial transport medium  Result Value Ref Range Status   SARS Coronavirus 2 by RT PCR NEGATIVE NEGATIVE Final    Comment: (NOTE) SARS-CoV-2 target nucleic acids are NOT DETECTED.  The SARS-CoV-2 RNA is generally detectable in upper respiratory  specimens during the acute phase of infection. The lowest concentration of SARS-CoV-2 viral copies this assay can detect is 138 copies/mL. A negative result does not preclude SARS-Cov-2 infection and should not be used as the sole basis for treatment or other patient management decisions. A negative result may occur with  improper specimen collection/handling, submission of specimen other than nasopharyngeal swab, presence of viral mutation(s) within the areas targeted by this assay, and inadequate number of viral copies(<138 copies/mL). A negative result must be combined with clinical observations, patient history, and  epidemiological information. The expected result is Negative.  Fact Sheet for Patients:  EntrepreneurPulse.com.au  Fact Sheet for Healthcare Providers:  IncredibleEmployment.be  This test is no t yet approved or cleared by the [place] FDA and  has been authorized for detection and/or diagnosis of SARS-CoV-2 by FDA under an Emergency Use Authorization (EUA). This EUA will remain  in effect (meaning this test can be used) for the duration of the COVID-19 declaration under Section 564(b)(1) of the Act, 21 U.S.C.section 360bbb-3(b)(1), unless the authorization is terminated  or revoked sooner.       Influenza A by PCR NEGATIVE NEGATIVE Final   Influenza B by PCR NEGATIVE NEGATIVE Final    Comment: (NOTE) The Xpert Xpress SARS-CoV-2/FLU/RSV plus assay is intended as an aid in the diagnosis of influenza from Nasopharyngeal swab specimens and should not be used as a sole basis for treatment. Nasal washings and aspirates are unacceptable for Xpert Xpress SARS-CoV-2/FLU/RSV testing.  Fact Sheet for Patients: EntrepreneurPulse.com.au  Fact Sheet for Healthcare Providers: IncredibleEmployment.be  This test is not yet approved or cleared by the [place] FDA and has been authorized for detection and/or diagnosis of SARS-CoV-2 by FDA under an Emergency Use Authorization (EUA). This EUA will remain in effect (meaning this test can be used) for the duration of the COVID-19 declaration under Section 564(b)(1) of the Act, 21 U.S.C. section 360bbb-3(b)(1), unless the authorization is terminated or revoked.  Performed at [facility], [place] [address] [place], [place] [address]      Labs: Basic Metabolic Panel: Recent Labs  Lab 09/05/20 0241 09/08/20 0927 09/09/20 2952 09/10/20 0607 09/11/20 0658  NA 138 139 139 138 139  K 3.5 3.8 4.0 3.8 3.9  CL 111 107 109 108 107  CO2 24 28 26 23 26    GLUCOSE 110* 98 103* 88 102*  BUN 33* 17 17 12 19   CREATININE 0.97 0.98 0.98 0.92 0.97  CALCIUM 8.1* 7.9* 8.1* 8.2* 8.7*  MG  --   --  1.7 1.7 2.0   Liver Function Tests: No results for input(s): AST, ALT, ALKPHOS, BILITOT, PROT, ALBUMIN in the last 168 hours. No results for input(s): LIPASE, AMYLASE in the last 168 hours. No results for input(s): AMMONIA in the last 168 hours. CBC: Recent Labs  Lab 09/05/20 0241 09/05/20 1818 09/06/20 0248 09/06/20 1444 09/07/20 0741 09/07/20 1506 09/09/20 1452 09/09/20 2223 09/10/20 0607 09/10/20 1633 09/11/20 0658  WBC 7.5  --  5.4  --  5.3  --   --   --   --   --   --   NEUTROABS  --   --  2.7  --  3.2  --   --   --   --   --   --   HGB 8.3*   < > 7.2*   < > 8.1*   < > 8.5* 8.9* 7.5* 8.4* 8.5*  HCT 24.3*   < > 21.3*   < >  23.5*   < > 26.8* 27.1* 22.7* 25.7* 26.1*  MCV 93.1  --  94.7  --  94.4  --   --   --   --   --   --   PLT 223  --  203  --  270  --   --   --   --   --   --    < > = values in this interval not displayed.   Cardiac Enzymes: No results for input(s): CKTOTAL, CKMB, CKMBINDEX, TROPONINI in the last 168 hours. BNP: BNP (last 3 results) No results for input(s): BNP in the last 8760 hours.  ProBNP (last 3 results) No results for input(s): PROBNP in the last 8760 hours.  CBG: No results for input(s): GLUCAP in the last 168 hours.     Signed:  Florencia Reasons MD, PhD, FACP  [facility] 09/11/2020, 3:04 PM

## 2020-09-11 NOTE — Plan of Care (Signed)
  Problem: Education: Goal: Knowledge of General Education information will improve Description: Including pain rating scale, medication(s)/side effects and non-pharmacologic comfort measures Outcome: Progressing   Problem: Health Behavior/Discharge Planning: Goal: Ability to manage health-related needs will improve Outcome: Progressing   Problem: Clinical Measurements: Goal: Ability to maintain clinical measurements within normal limits will improve Outcome: Progressing Goal: Will remain free from infection Outcome: Progressing Goal: Diagnostic test results will improve Outcome: Progressing Goal: Respiratory complications will improve Outcome: Progressing Goal: Cardiovascular complication will be avoided Outcome: Completed/Met   Problem: Nutrition: Goal: Adequate nutrition will be maintained Outcome: Completed/Met   Problem: Coping: Goal: Level of anxiety will decrease Outcome: Completed/Met   Problem: Elimination: Goal: Will not experience complications related to bowel motility Outcome: Progressing Goal: Will not experience complications related to urinary retention Outcome: Completed/Met   Problem: Pain Managment: Goal: General experience of comfort will improve Outcome: Completed/Met   Problem: Safety: Goal: Ability to remain free from injury will improve Outcome: Completed/Met   Problem: Skin Integrity: Goal: Risk for impaired skin integrity will decrease Outcome: Completed/Met

## 2020-09-11 NOTE — Progress Notes (Signed)
   09/11/20 1506  AVS Discharge Documentation  AVS Discharge Instructions Including Medications Provided to patient/caregiver  Name of Person Receiving AVS Discharge Instructions Including Medications Jose Capuchin "[name]"  Name of Clinician That Reviewed AVS Discharge Instructions Including Medications Gregary Signs, RN    Patient educated about discharge instructions and verbalized an understanding of teaching. Patient discharged to home with all personal belongings and d/c paperwork. Patient IV removed prior to d/c, tolerated well. Patient left unit via ambulation with writer, no issues.

## 2020-09-11 NOTE — Progress Notes (Signed)
    Progress Note   Subjective  Feeling very well Stools brown to light brown No melena or red blood per rectum  Second video capsule endoscopy read today negative   Objective  Vital signs in last 24 hours: Temp:  [97.7 F (36.5 C)-98.3 F (36.8 C)] 97.7 F (36.5 C) (04/17 0840) Pulse Rate:  [69-88] 69 (04/17 0840) Resp:  [16-20] 18 (04/17 0840) BP: (98-105)/(62-67) 105/62 (04/17 0840) SpO2:  [96 %-100 %] 100 % (04/17 0840) Last BM Date: 09/10/20  Gen: awake, alert, NAD HEENT: anicteric Abd: soft, NT/ND, +BS throughout Ext: no c/c/e Neuro: nonfocal  Intake/Output from previous day: 04/16 0701 - 04/17 0700 In: 340 [P.O.:240; IV Piggyback:100] Out: -  Intake/Output this shift: No intake/output data recorded.  Lab Results: Recent Labs    09/10/20 0607 09/10/20 1633 09/11/20 0658  HGB 7.5* 8.4* 8.5*  HCT 22.7* 25.7* 26.1*   BMET Recent Labs    09/09/20 0632 09/10/20 0607 09/11/20 0658  NA 139 138 139  K 4.0 3.8 3.9  CL 109 108 107  CO2 26 23 26   GLUCOSE 103* 88 102*  BUN 17 12 19   CREATININE 0.98 0.92 0.97  CALCIUM 8.1* 8.2* 8.7*   LFT No results for input(s): PROT, ALBUMIN, AST, ALT, ALKPHOS, BILITOT, BILIDIR, IBILI in the last 72 hours. PT/INR Recent Labs    09/09/20 0632  LABPROT 15.5*  INR 1.2      Assessment & Recommendations  65 year old male with occult GI bleeding  1.  Occult GI bleeding --GI bleeding has stopped.  Second video capsule endoscopy today complete, good prep, negative for bleeding.  No melena seen when the capsule reaches the colon Hemoglobin is stable  --Okay for discharge home --He knows to notify me and come to the ER for any recurrent melena --Return to my office next week on Tuesday or Wednesday for a hemoglobin and iron studies --Outpatient Meckel's diverticulum scan given the significance of his recent GI bleed        LOS: 7 days   Lajuan Lines Jeptha Hinnenkamp  09/11/2020, 2:09 PM [phone]

## 2020-09-12 ENCOUNTER — Other Ambulatory Visit: Payer: Self-pay | Admitting: Cardiology

## 2020-09-12 ENCOUNTER — Other Ambulatory Visit: Payer: Self-pay

## 2020-09-12 ENCOUNTER — Telehealth: Payer: Self-pay | Admitting: Internal Medicine

## 2020-09-12 DIAGNOSIS — I4891 Unspecified atrial fibrillation: Secondary | ICD-10-CM

## 2020-09-12 DIAGNOSIS — K922 Gastrointestinal hemorrhage, unspecified: Secondary | ICD-10-CM

## 2020-09-12 NOTE — Telephone Encounter (Signed)
Inbound call from patient. Had surgery 4/16 and calling to discuss and schedule the meckel test. Please call patient [phone]

## 2020-09-12 NOTE — Telephone Encounter (Signed)
Ok for [employer] booster #2  Zinc 50 mg is fine Copper 2 mg every other day is probably fine, but copper deficiency is extremely rare and copper excess can certainly cause problems.  Most people get enough copper in regular diet and a standard MVI dose should be sufficient.  All that said, copper did not cause his GI bleed.  Thanks [employer]

## 2020-09-12 NOTE — Telephone Encounter (Signed)
   Patient has questions about taking Zinc Patient declined  to provide additional details Appointment 4/19

## 2020-09-12 NOTE — Progress Notes (Signed)
   Cardiology asked to place cardiac event monitor for possible afib. No PCP cardiologist noted in chart. Therefore will assign to Dr. Harrington Challenger (inpatient DOD) to read once completed.

## 2020-09-12 NOTE — Telephone Encounter (Signed)
Pt scheduled for meckel scan at [facility] 09/29/20@7am , pt to arrive there at 6:45am and be NPO 6 hours prior to scan. Pt states he cannot do this date, pt given phone number [phone] to reschedule to another date that works for him.   Pt wants to know if Dr. Hilarie Fredrickson thinks:  Is it ok for him to go ahead with 2nd covid booster?  Is is ok for him to take zinc 50mg  qod along with copper 2mg  qod? He wants to make sure that did not cause any of his issues.  Please advise.

## 2020-09-13 ENCOUNTER — Encounter: Payer: Self-pay | Admitting: Internal Medicine

## 2020-09-13 ENCOUNTER — Ambulatory Visit (INDEPENDENT_AMBULATORY_CARE_PROVIDER_SITE_OTHER): Payer: 59 | Admitting: Internal Medicine

## 2020-09-13 ENCOUNTER — Telehealth: Payer: Self-pay

## 2020-09-13 ENCOUNTER — Other Ambulatory Visit: Payer: Self-pay

## 2020-09-13 DIAGNOSIS — D62 Acute posthemorrhagic anemia: Secondary | ICD-10-CM

## 2020-09-13 DIAGNOSIS — K922 Gastrointestinal hemorrhage, unspecified: Secondary | ICD-10-CM

## 2020-09-13 DIAGNOSIS — I482 Chronic atrial fibrillation, unspecified: Secondary | ICD-10-CM

## 2020-09-13 DIAGNOSIS — R195 Other fecal abnormalities: Secondary | ICD-10-CM

## 2020-09-13 NOTE — Telephone Encounter (Signed)
Spoke with pt and he is aware. 

## 2020-09-13 NOTE — Patient Instructions (Signed)
Stay off the aspirin and we will wait on the results of the 30 day monitor.   It is okay to take magnesium and it may help with sleep or muscle spasms/cramps.  Tylenol is safest to take for pain or headache.

## 2020-09-13 NOTE — Telephone Encounter (Signed)
Patient is advised to come for labs next week.  He has been scheduled for Meckel's scan on 4/29.

## 2020-09-13 NOTE — Progress Notes (Signed)
   Subjective:   Patient ID: Jose Holden, male    DOB: Mar 14, 1956, 65 y.o.   MRN: [id number]  HPI The patient is a 65 YO man coming in for hospital follow up (in for melena and GI bleeding, EGD and colonoscopy and capsule endoscopy and no source determined, was taking aspirin 325 mg due to prior paroxsymal a fib, no a fib detected during hospital stay, they advised him to stop this, had blood transfusion). He is scheduled for a meckel scan to evaluate for this. Has not had bleeding since leaving hospital. Is off aspirin. Is doing 30 day monitor for A fib to assess if he is in A fib or if we may be able to disregard this history. CHADsVASC 0 so no anticoagulation is okay. Denies chest pains or SOB. Still some fatigue. Eating and drinking well. Having normal BM. Denies diarrhea or constipation.   PMH, [facility], social history reviewed and updated  Review of Systems  Constitutional: Negative.   HENT: Negative.   Eyes: Negative.   Respiratory: Negative for cough, chest tightness and shortness of breath.   Cardiovascular: Negative for chest pain, palpitations and leg swelling.  Gastrointestinal: Negative for abdominal distention, abdominal pain, constipation, diarrhea, nausea and vomiting.  Musculoskeletal: Negative.   Skin: Negative.   Neurological: Negative.   Psychiatric/Behavioral: Negative.     Objective:  Physical Exam Constitutional:      Appearance: He is well-developed.  HENT:     Head: Normocephalic and atraumatic.  Cardiovascular:     Rate and Rhythm: Normal rate and regular rhythm.  Pulmonary:     Effort: Pulmonary effort is normal. No respiratory distress.     Breath sounds: Normal breath sounds. No wheezing or rales.  Abdominal:     General: Bowel sounds are normal. There is no distension.     Palpations: Abdomen is soft.     Tenderness: There is no abdominal tenderness. There is no rebound.  Musculoskeletal:     Cervical back: Normal range of motion.  Skin:     General: Skin is warm and dry.  Neurological:     Mental Status: He is alert and oriented to person, place, and time.     Coordination: Coordination normal.     Vitals:   09/13/20 0939  BP: 124/72  Pulse: 94  Resp: 18  Temp: 98.1 F (36.7 C)  TempSrc: Oral  SpO2: 99%  Weight: 206 lb 9.6 oz (93.7 kg)  Height: 6\' 7"  (2.007 m)    This visit occurred during the SARS-CoV-2 public health emergency.  Safety protocols were in place, including screening questions prior to the visit, additional usage of staff PPE, and extensive cleaning of exam room while observing appropriate contact time as indicated for disinfecting solutions.   Assessment & Plan:

## 2020-09-13 NOTE — Telephone Encounter (Signed)
-----   Message from Jerene Bears, MD sent at 09/11/2020  2:11 PM EDT ----- Patient hospitalized this week with occult GI bleeding [facility] Plan patient Norberto Sorenson, FYI)  He needs to come back next Tuesday or Wednesday, around 09/20/2020 for CBC, IBC plus ferritin Please order an outpatient Meckel's diverticulum scan with nuclear medicine --recent GI bleed with an approximate 10 g drop in hemoglobin  Bleeding has since stopped but the scan completes the evaluation  Thanks JMP

## 2020-09-14 ENCOUNTER — Telehealth: Payer: Self-pay | Admitting: *Deleted

## 2020-09-14 NOTE — Telephone Encounter (Signed)
Returned call to patient he can swim with monitor on up to 30ft below water

## 2020-09-14 NOTE — Telephone Encounter (Signed)
Patient would like to know if he can take the heart monitor off to swim.

## 2020-09-15 NOTE — Assessment & Plan Note (Signed)
No CBC needed today. Having normal BM without dark color. Will wait about 3-4 weeks and then recheck CBC.

## 2020-09-15 NOTE — Assessment & Plan Note (Signed)
Will have meckel scan for thorough evaluation. Has had EGD and colonoscopy and capsule endoscopy without source.

## 2020-09-15 NOTE — Assessment & Plan Note (Signed)
Will do 30 day monitor and has never been irreg on exam. This was a history from years ago. CHADsVASC is 0 so can be without anticoagulation without significant increase in risk of stroke.

## 2020-09-20 ENCOUNTER — Telehealth: Payer: Self-pay | Admitting: Internal Medicine

## 2020-09-20 NOTE — Telephone Encounter (Signed)
Inbound call from [facility] with [facility]. Have prior authorization for CPT [phone]. Auth # [id number] approved from  09/23/20 - 11/23/20

## 2020-09-20 NOTE — Telephone Encounter (Signed)
I had already put in the auth info. Thanks

## 2020-09-20 NOTE — Telephone Encounter (Signed)
Amy does this come to you? Pt is scheduled for a Meckels scan in radiology.

## 2020-09-21 ENCOUNTER — Telehealth: Payer: Self-pay | Admitting: *Deleted

## 2020-09-21 ENCOUNTER — Encounter: Payer: Self-pay | Admitting: Internal Medicine

## 2020-09-21 ENCOUNTER — Other Ambulatory Visit (INDEPENDENT_AMBULATORY_CARE_PROVIDER_SITE_OTHER): Payer: 59

## 2020-09-21 ENCOUNTER — Ambulatory Visit (INDEPENDENT_AMBULATORY_CARE_PROVIDER_SITE_OTHER): Payer: 59

## 2020-09-21 DIAGNOSIS — K922 Gastrointestinal hemorrhage, unspecified: Secondary | ICD-10-CM

## 2020-09-21 DIAGNOSIS — I4891 Unspecified atrial fibrillation: Secondary | ICD-10-CM

## 2020-09-21 LAB — IBC + FERRITIN
Ferritin: 6.9 ng/mL — ABNORMAL LOW (ref 22.0–322.0)
Iron: 19 ug/dL — ABNORMAL LOW (ref 42–165)
Saturation Ratios: 5.1 % — ABNORMAL LOW (ref 20.0–50.0)
Transferrin: 267 mg/dL (ref 212.0–360.0)

## 2020-09-21 LAB — CBC
HCT: 28.5 % — ABNORMAL LOW (ref 39.0–52.0)
Hemoglobin: 9.4 g/dL — ABNORMAL LOW (ref 13.0–17.0)
MCHC: 33.1 g/dL (ref 30.0–36.0)
MCV: 86.3 fl (ref 78.0–100.0)
Platelets: 366 10*3/uL (ref 150.0–400.0)
RBC: 3.3 Mil/uL — ABNORMAL LOW (ref 4.22–5.81)
RDW: 16.2 % — ABNORMAL HIGH (ref 11.5–15.5)
WBC: 5.4 10*3/uL (ref 4.0–10.5)

## 2020-09-21 NOTE — Telephone Encounter (Signed)
Left message to call back  Received [employer] critical monitor report, day 1/30.   Showing AFLutter, rate 80 This was baseline report (pt placed monitor on).  Have reached out to monitor team for further strips to determine if pt still out of rhythm vs in NSR. Left pt message and was going to request he hit monitor button so we could see what is current rhythm

## 2020-09-22 ENCOUNTER — Telehealth: Payer: Self-pay | Admitting: Cardiology

## 2020-09-22 ENCOUNTER — Telehealth: Payer: Self-pay | Admitting: Internal Medicine

## 2020-09-22 DIAGNOSIS — I482 Chronic atrial fibrillation, unspecified: Secondary | ICD-10-CM

## 2020-09-22 NOTE — Telephone Encounter (Signed)
Referral placed we will get him in with cardiology.

## 2020-09-22 NOTE — Telephone Encounter (Signed)
Patient returned my previous call.  I advised him of DOD recommendation that he see A fib clinic.  He does not want to see Afib clinic at this time.  He will consider clinic once full 30 day heart monitor is complete.  I made him aware that he has an appointment with Dr. Gardiner Rhyme on November 01, 2020 at 9:40 am.  Patient thanked me for call and reports he will be at scheduled OV.

## 2020-09-22 NOTE — Telephone Encounter (Signed)
Cardiology Progress Note Contacted by [employer] on-call overnight. Pt had episode flagged as 14-beats NSVT, but [employer] tech says that it actually looks like AF with aberrant conduction. She will send report to ordering provider; I will also send a notification.  Rudean Curt, MD , [facility] 10:33 PM

## 2020-09-22 NOTE — Telephone Encounter (Addendum)
Patient returning call in regards to 09/21/20 Critical Heart monitor report.  He wants to know why this was an urgent call.  I explained to him that we have protocols to follow.  Having Afib and no blood thinner is alarming without knowing pt back story.   He reports that he was previously on ASA 81 and was increased to ASA 325 mg. Pt stated he began to have black tarry stools for a few days.  He then went to the ED and was admitted dx Acute GI bleed.  Patient states that heart monitor was ordered to determine how frequent A fib occurs.  Also, states that PCP said his risk are low so he doesn't need a blood thinner. 09/13/20 PCP OV note CHADsVASC 0.  He denies funny heart beats.  He reports he feels light headed when he stands up.  HGB 09/21/20 was 9.4 explained that this is contributing factor to light headedness.   He wants to know what blood thinner he should be taking STAT.  I advised him that I am going to have Fransico Him, MD  DOD for advisement.  DOD will not prescribe blood thinner at this time.  Called patient to give DOD recommendation left a message for him to call back.

## 2020-09-22 NOTE — Telephone Encounter (Signed)
See below

## 2020-09-22 NOTE — Telephone Encounter (Signed)
Patient called and was wondering if Dr. Sharlet Salina could recommend a cardiologist. He can be reached at [phone]

## 2020-09-22 NOTE — Telephone Encounter (Signed)
Left a message for patient to call the office in regards to critical monitor report.

## 2020-09-23 ENCOUNTER — Encounter (HOSPITAL_COMMUNITY)
Admission: RE | Admit: 2020-09-23 | Discharge: 2020-09-23 | Disposition: A | Discharge status: Home / Self Care | Payer: 59 | Source: Ambulatory Visit | Attending: Internal Medicine | Admitting: Internal Medicine

## 2020-09-23 ENCOUNTER — Other Ambulatory Visit: Payer: Self-pay

## 2020-09-23 DIAGNOSIS — K922 Gastrointestinal hemorrhage, unspecified: Secondary | ICD-10-CM | POA: Insufficient documentation

## 2020-09-23 DIAGNOSIS — D509 Iron deficiency anemia, unspecified: Secondary | ICD-10-CM

## 2020-09-23 MED ORDER — FERROUS SULFATE 325 (65 FE) MG PO TABS
325.0000 mg | ORAL_TABLET | Freq: Two times a day (BID) | ORAL | 3 refills | Status: DC
Start: 2020-09-23 — End: 2023-09-05

## 2020-09-23 MED ORDER — SODIUM PERTECHNETATE TC 99M INJECTION
10.6000 | Freq: Once | INTRAVENOUS | Status: AC | PRN
  Administered 2020-09-23: 10.6 via INTRAVENOUS

## 2020-09-23 NOTE — Telephone Encounter (Addendum)
Received monitor report day 2 critical, auto triggered.  Reviewed with DOD: Rapid AF with aberrant conduction, recent GI bleed on aspirin 325mg , recommend appointment with Dr. Harrington Challenger to consider Eliquis.  Appointment made, added to DOD schedule on Monday at 1:40pm  Called patient and left message for patient to call back.

## 2020-09-26 ENCOUNTER — Telehealth: Payer: Self-pay | Admitting: *Deleted

## 2020-09-26 ENCOUNTER — Telehealth: Payer: Self-pay | Admitting: Gastroenterology

## 2020-09-26 ENCOUNTER — Ambulatory Visit: Payer: 59 | Admitting: Internal Medicine

## 2020-09-26 NOTE — Telephone Encounter (Signed)
Inbound call from patient. Had questions about what ferritin is. Also want to know the specific benefits of Magnesium in in addition to iron medication. Want to know if it will help him overall. Best contact # [phone]

## 2020-09-26 NOTE — Telephone Encounter (Signed)
Patient notified

## 2020-09-26 NOTE — Telephone Encounter (Signed)
Jose Holden patient, well known to me after recent hospital stay with occult GI bleeding. See Meckel's scan results as well.  Ferritin is iron store which is now low after significant GI bleed.  He has borrowed from iron store to make new red cells and the store is now low. Dr. Fuller Holden recommended oral iron to replenish his iron stores.  Mg should not be necessary and should not improve iron levels.  Vit C can help with iron absorption, but is not necessary.  Excess Vit C can cause looser stools.  Hope that helps. JMP

## 2020-09-26 NOTE — Telephone Encounter (Signed)
Patient states his heart monitor seems to lose contact with the recorder and he has to reboot it, but when it reboots it does gain contact again. He states it seems to lose contact easily and is not sure if there may be gaps in the readings. He states he has to reboot it more often than makes sense and is not sure if the monitor is functioning consistently. He states the the monitor may need to be replaced.

## 2020-09-26 NOTE — Telephone Encounter (Signed)
See note below from pt regarding benefit of magnesium with Iron, please advise.

## 2020-09-27 ENCOUNTER — Telehealth: Payer: Self-pay | Admitting: Cardiology

## 2020-09-27 ENCOUNTER — Telehealth: Payer: Self-pay | Admitting: Gastroenterology

## 2020-09-27 NOTE — Telephone Encounter (Signed)
Follow up:     Patient calling to check the status of his last message concering his monitor. Please advise.

## 2020-09-27 NOTE — Telephone Encounter (Signed)
Called patient, he states he would like to make sure it is okay for him to take Propranolol (RX on file states to use as needed for performance induced stress) he would like to know if he can take this with his dx for Afib, and if so how often and how many times in the day would be okay? medication was not prescribed by you, but he would like your recommendations.

## 2020-09-27 NOTE — Telephone Encounter (Signed)
Patient notified that iron is not a prescription. He is advised to return to the pharmacy and purchase 325 mg OTC

## 2020-09-27 NOTE — Telephone Encounter (Signed)
Called our [employer] representative to discuss patients monitor problems.  It may be one of the two monitor recorders has a loose connection.  He will have [employer] troubleshooting department call the patient within the next half hour. Will have troubleshooting overnight a new monitor if they cannot fix.   Called patient to let him know [employer] will call within the next half hour.  Explained the possibility of loose connection.  If he does not feel confident in monitor, just request new device is shipped.  Patient started recording consistently 09/21/20 through 09/24/20.  No events posted since 09/24/20.  Patient will be going out of town.   Instructed patient, [employer] will need to know address of where he would want it shipped and the date he will be there.

## 2020-09-27 NOTE — Telephone Encounter (Signed)
Inbound call from patient. States that [facility] have no record of iron prescription ferrous sulfate. Patient would call back at [phone]

## 2020-09-27 NOTE — Telephone Encounter (Signed)
New message:    Patient would like to know would it be ok if he take propranolol. Please call patient.

## 2020-09-28 ENCOUNTER — Telehealth: Payer: Self-pay | Admitting: Internal Medicine

## 2020-09-28 NOTE — Telephone Encounter (Signed)
Looks like this patient has not been seen by cardiology before, looks like has appointment coming up, needs to establish care before we start making medication recommendations, would defer to PCP until he is seen

## 2020-09-28 NOTE — Telephone Encounter (Signed)
Called by [employer] for automatically detected NSVT up to 160bpm. The longest run was 8 beats. The patient did not report any symptoms.Will update Dr. Harrington Challenger.  Alric Quan, MD Cardiology [facility]

## 2020-09-29 ENCOUNTER — Telehealth: Payer: Self-pay | Admitting: Gastroenterology

## 2020-09-29 ENCOUNTER — Encounter (HOSPITAL_COMMUNITY): Payer: 59

## 2020-09-29 ENCOUNTER — Telehealth: Payer: Self-pay

## 2020-09-29 NOTE — Telephone Encounter (Signed)
Left message for patient to call back  

## 2020-09-29 NOTE — Telephone Encounter (Signed)
See phone note-patient scheduled with afib clinic 5/11 d/t monitor alerts

## 2020-09-29 NOTE — Telephone Encounter (Signed)
Received 09/21/20 monitor strip at 12:00 pm revealed afib rate 160.3.Dr.Schumann advised schedule appointment with afib clinic.Appointment with Dr.Schumann 11/01/20 cancelled.Afib clinic appointment scheduled with Roderic Palau PA 10/05/20 at 2:00 pm. Called patient left message on personal voice mail with instructions.Advised to [name].Advised to bring a list of all medications.Monitor strip faxed to afib clinic at fax # [phone].

## 2020-09-29 NOTE — Telephone Encounter (Signed)
Patient called said he has been taking Ferrous Sulfate medication and is having issues with it. He said he is not able to sleep in a few days and is seeking advise.

## 2020-09-30 ENCOUNTER — Telehealth: Payer: Self-pay | Admitting: *Deleted

## 2020-09-30 NOTE — Telephone Encounter (Addendum)
Spent 21 min on phone w/ pt. Pt states that "twice now the result from my monitor has been that I am in constant AFib".  Pt made aware that I would further investigate that statement as I don't see documentation to this.   He is wondering if - "I have been told that there isn't any cause for alarm so why was appt cancelled w/ Gardiner Rhyme to be seen by the AFib clinic"?   He reports that he has only worn monitor for about a week now.  Furthers details that he has been having an issue w/ it being itchy at night and would like to know if he can take it off at night.  Pt advised to call company and asks for sensitive patches -- pt reports he already has and they are on their way.   He also mentions that he is concerned about taking a blood thinner, if recommended.  He "is curious about another medication other than a blood thinner, I am very very weary about taking anything like that".  States that he had to hospitalized years ago because he began to bleed on ASA 325 mg, and "lost half my blood", but they ran many tests and could not determine cause of bleed.  Pt aware that will forward to MD and his nurse to address concerns/questions.  Aware that MD is out of the office until next Tuesday and it may be then before advisement. Aware that I will not cancel AFib clinic appt until MD advises...Marland KitchenMarland Kitchen aware that we can get him r/s but need to determine scheduling w/ clinic vs MD. Pt would like an answer today, but willing to wait till next week.  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 He is currently out of town and will not return until 5/15, so need to keep that in mind when rescheduling.

## 2020-09-30 NOTE — Telephone Encounter (Signed)
I left a detailed message for the patient with Dr. Lynne Leader response

## 2020-09-30 NOTE — Telephone Encounter (Signed)
Will route to AFib clinic to address getting pt rescheduled.  Pt will not be in town to make it to the 5/11 appt.

## 2020-09-30 NOTE — Telephone Encounter (Signed)
Tried again to reach patient.  Left message to call office 

## 2020-09-30 NOTE — Telephone Encounter (Signed)
Received call transferred from operator and spoke with [employer]. [employer] reports at 1:38 central time today monitor showed 3-8 beats of VT rate up to 165.  Underlying rhythm is Afib with RVR. Recent recording showed 29 PVC's with couplets.   End of reading showed 7 beats VT.  I asked about current rhythm and most recent readings show Afib -rate 110. Also having PVC's and couplets with 3 runs of VT and frequent runs of VT on most recent recordings.  Readings from 1:38 will be uploaded but readings after this can not be uploaded at this time because [employer] reports they have not been received yet. I placed call to patient and left message to call office as soon as he receives the message.

## 2020-09-30 NOTE — Telephone Encounter (Signed)
This patient has not been seen by anyone in our group yet so it is difficult to make recommendations before he has been seen.  I would recommend keeping his appointment with the Afib clinic on 5/11 as that is going to be the soonest appointment.  It looks like he was scheduled for an initial visit with me in June and this was cancelled, I am happy to see him, but I would definitely start with keeping  his appointment with Afib clinic on 5/11

## 2020-09-30 NOTE — Telephone Encounter (Signed)
Unusual for Fe to cause insomnia however ok to change Fe 325 mg to once daily with a meal.  Please advise him to return to Dr. Sharlet Salina, his PCP, for ongoing mgmt and follow up of his anemia.

## 2020-09-30 NOTE — Telephone Encounter (Signed)
Patient is calling back to say that he has questions about the monitor and also to reschedule his appt. Patient didn't realize it would be out of town and wouldn't be returning until 5/14

## 2020-09-30 NOTE — Telephone Encounter (Signed)
Patient believes that the iron supplement is causing insomnia.  He wants Dr. Lynne Leader opinion on this.  We discussed that this is not a typical side effect of iron.  He believes there is a direct correlation and wants to know Dr. Lynne Leader opinion on taking one 325 mg tablet a day for 6 months, rather than take 325 BID for 3.

## 2020-09-30 NOTE — Telephone Encounter (Signed)
Meron with [employer] is calling to report critical EKG results.

## 2020-09-30 NOTE — Telephone Encounter (Signed)
Reviewed what monitor has shown w/ monitor team. Monitor has consistently shown Fib/Flutter w/ possible runs of VT/artifact since being placed.  Pt was made aware to continue wearing monitor until Dr Newman Nickels advisement.

## 2020-09-30 NOTE — Telephone Encounter (Signed)
All strips Central time--Strips from 9:51 PM on 5/5, 8:32 PM on 5/5, 1:03 PM on 5/6 and 2:06 PM on 5/6 reviewed with Dr Angelena Form (DOD).  Recordings show Atrial fib with NSVT.  Patient should keep appointment with afib clinic next week.  If feeling poorly he should go to ED.  Strips to be scanned into chart

## 2020-10-01 NOTE — Telephone Encounter (Signed)
PLan to continue to follow Pt will need appt in cardiology with me

## 2020-10-03 ENCOUNTER — Telehealth: Payer: Self-pay

## 2020-10-03 NOTE — Telephone Encounter (Signed)
Left message for the pt to call back re: his monitor report strips from ober this past weekend... Days 10,11,12 starting 09/30/20.   Will review with the DOD Dr. Tamala Julian.   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

## 2020-10-03 NOTE — Telephone Encounter (Signed)
Patient returned my message and confirmed appt for 10/12/20 at 11 am with [facility].

## 2020-10-03 NOTE — Telephone Encounter (Addendum)
Spent over an hour of time on the phone with the pt and he is in [place] and he is not returning to [place] until 10/09/20.   I have went over his abnormal monitor strips and advised him that the DOD Dr. Tamala Julian strongly recommended that he be seen ASAP.... the pt says he cannot be seen but after several minutes of conversation on the phone the pt is willing to be seen this week but has many stipulations of when he can be seen .   He does report having SOB and palps but says he is "fine" and very upset by my urgency on the phone with him.   He says that his PCP did an EKG last year abd advised him he was "zero" risk for CVA and MI.   I explained the monitor findings and with his recent GI bleed/ anemia he needs to be seen by a cardiologist.   Pt is tentatively agreeing to an appt with the DOD Dr. Curt Bears this Thursday 10/06/20 but has to check with his appt he has in [place] that night and see if he can be late.   I also explained to him the risk of him driving and also the risk of sitting in a car for several hours in a day for DVT.   Pt was very upset with our conversation and says he still does not understand or agree that he has anything wrong but will let us know if he plans to keep or cancel the Thursday appt.   *pt called back to let me know that he will be at the appt 10/06/20. He will use caution when driving he feels palp, sob, dizziness he will not drive and try to make alternate plans for transportation if needed.

## 2020-10-03 NOTE — Telephone Encounter (Signed)
Left message for the pt to call back re: his monitor report strips from over this past weekend... Days 10,11,12 starting 09/30/20- 10/03/20.   Will review with the DOD Dr. Tamala Julian.   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   After reviewing with Dr. Tamala Julian the DOD.Jose Holden pt NEEDS TO BE SEEN ASAP TO DETERMINE ANTICOAGULATION IN CONTEXT OF HIS ANEMIA/ RECENT GI BLEED.Jose Holden   Pt needs to return home sooner for appt. or be seen where he is currently staying.   *strips revealed continuous Afib with RVR and runs of VTACH (3-10 beats), couplets/PVC's and PAC's.

## 2020-10-03 NOTE — Telephone Encounter (Signed)
Called and left message with new appt info of 10/12/20 at 11 am & asked patient to call back to confirm appt.

## 2020-10-03 NOTE — Telephone Encounter (Signed)
This phone note documented on a recent phone note will close this encounter.

## 2020-10-04 ENCOUNTER — Telehealth: Payer: Self-pay | Admitting: Cardiology

## 2020-10-04 NOTE — Telephone Encounter (Signed)
Notified by [employer] of atrial flutter w/ variable conduction and NSVT episodes up to 9 beats in length. Review of notes over past several days shows pt has been having afib/flutter and NSVT and a treatment plan is already in place.  Rudean Curt, MD , [facility] 10:34 PM

## 2020-10-05 ENCOUNTER — Ambulatory Visit (HOSPITAL_COMMUNITY): Payer: 59 | Admitting: Nurse Practitioner

## 2020-10-05 NOTE — Telephone Encounter (Signed)
Pt is returning call.  

## 2020-10-05 NOTE — Telephone Encounter (Signed)
Spoke with the pt and he reports that the only symptoms he has is dyspnea. He plans to keep his appt with Dr. Curt Bears tomorrow.

## 2020-10-05 NOTE — Telephone Encounter (Addendum)
Fax received in office for report to Dr. Hassell Done.  Report: 10/04/20 7:22 PM  Atrial Flutter with run of VT (3-9) beats/couplet PVC's/MF PVC's (19 in 1 min) max rate 173  Left detailed message for patient  to call back.

## 2020-10-05 NOTE — Telephone Encounter (Signed)
DOD reviewed strip and signed. Continue to monitor and have visit tomorrow as planned. Strip placed in Dr. Curt Bears box for appointment tomorrow.

## 2020-10-06 ENCOUNTER — Telehealth: Payer: Self-pay | Admitting: Internal Medicine

## 2020-10-06 ENCOUNTER — Encounter: Payer: Self-pay | Admitting: Cardiology

## 2020-10-06 ENCOUNTER — Ambulatory Visit (INDEPENDENT_AMBULATORY_CARE_PROVIDER_SITE_OTHER): Payer: 59 | Admitting: Cardiology

## 2020-10-06 ENCOUNTER — Other Ambulatory Visit: Payer: Self-pay

## 2020-10-06 ENCOUNTER — Ambulatory Visit: Payer: 59 | Admitting: Cardiology

## 2020-10-06 VITALS — BP 136/64 | HR 95 | Ht 79.0 in | Wt 202.0 lb

## 2020-10-06 DIAGNOSIS — I4819 Other persistent atrial fibrillation: Secondary | ICD-10-CM | POA: Diagnosis not present

## 2020-10-06 DIAGNOSIS — Z79899 Other long term (current) drug therapy: Secondary | ICD-10-CM

## 2020-10-06 MED ORDER — METOPROLOL SUCCINATE ER 50 MG PO TB24: 50.0000 mg | ORAL_TABLET | Freq: Every day | ORAL | 3 refills | Status: DC

## 2020-10-06 NOTE — Telephone Encounter (Signed)
Jose Holden is calling to report a critical EKG for this pt.

## 2020-10-06 NOTE — Telephone Encounter (Signed)
3 runs of VT (8, 6, 4) aflutter with 8 PVC's. Will await fax and send to nurse covering visit today at 11:45pm

## 2020-10-06 NOTE — Progress Notes (Signed)
Electrophysiology Office Note   Date:  10/06/2020   ID:  Jose Holden, DOB Oct 01, 1955, MRN [id number]  PCP:  Hoyt Koch, MD  Cardiologist:   Primary Electrophysiologist:  Yahmir Sokolov Meredith Leeds, MD    Chief Complaint: Atrial fibrillation   History of Present Illness: Jose Holden is a 65 y.o. male who is being seen today for the evaluation of atrial fibrillation at the request of Hoyt Koch, *. Presenting today for electrophysiology evaluation.  He has a history significant for atrial fibrillation, retinal detachment.  He had a recent hospitalization for GI bleed.  He had an EGD, colonoscopy, and capsule endoscopy which found no source for the bleeding.  He is scheduled for a Meckel scan.  Today, he denies symptoms of palpitations, chest pain, orthopnea, PND, lower extremity edema, claudication, dizziness, presyncope, syncope, bleeding, or neurologic sequela. The patient is tolerating medications without difficulties.  He wore a cardiac monitor that showed persistent atrial fibrillation.  ECG today confirms that he is in atrial fibrillation.  He gets short of breath when he exerts himself.  When he walks normally and is at rest, he feels well.  He is a [place], and when he carries his cello as well as his portable chair, this causes him to feel short of breath.  He also has some mild fatigue.  He does not notice palpitations.  He would like to get back into normal rhythm eventually.   Past Medical History:  Diagnosis Date  . A-fib (HCC)    On Aspirin only  . Anxiety   . Cataract    removed left eye that caused retinal detachment x2  . ED (erectile dysfunction)   . Heart murmur    past hx per pt   . Lactose intolerance   . Retinal detachment    x2- left eye    Past Surgical History:  Procedure Laterality Date  . APPENDECTOMY    . COLONOSCOPY     10 yr ago [place]- normal per pt   . COLONOSCOPY WITH PROPOFOL N/A 09/07/2020   Procedure:  COLONOSCOPY WITH PROPOFOL;  Surgeon: Jerene Bears, MD;  Location: [facility];  Service: Gastroenterology;  Laterality: N/A;  . ESOPHAGOGASTRODUODENOSCOPY (EGD) WITH PROPOFOL N/A 09/05/2020   Procedure: ESOPHAGOGASTRODUODENOSCOPY (EGD) WITH PROPOFOL;  Surgeon: Jerene Bears, MD;  Location: [facility]COPY;  Service: Gastroenterology;  Laterality: N/A;  . EYE SURGERY     detached retina surgery x2  . GIVENS CAPSULE STUDY N/A 09/05/2020   Procedure: GIVENS CAPSULE STUDY;  Surgeon: Jerene Bears, MD;  Location: [facility];  Service: Gastroenterology;  Laterality: N/A;  . GIVENS CAPSULE STUDY N/A 09/10/2020   Procedure: GIVENS CAPSULE STUDY;  Surgeon: Jerene Bears, MD;  Location: [facility];  Service: Gastroenterology;  Laterality: N/A;  . LAPAROSCOPIC APPENDECTOMY N/A 08/29/2015   Procedure: APPENDECTOMY LAPAROSCOPIC;  Surgeon: Johnathan Hausen, MD;  Location: [facility];  Service: General;  Laterality: N/A;  . UPPER GASTROINTESTINAL ENDOSCOPY       Current Outpatient Medications  Medication Sig Dispense Refill  . Cholecalciferol (VITAMIN D-3 PO) Take 1 tablet by mouth daily with breakfast.    . ferrous sulfate ([employer] IRON) 325 (65 FE) MG tablet Take 1 tablet (325 mg total) by mouth 2 (two) times daily with a meal.  3  . metoprolol succinate (TOPROL-XL) 50 MG 24 hr tablet Take 1 tablet (50 mg total) by mouth daily. Take with or immediately following a meal. 30 tablet 3  . propranolol (  INDERAL) 20 MG tablet Take 1 tablet (20 mg total) by mouth 2 (two) times daily as needed (for performance-induced stress).    . SELENIUM PO Take 1 tablet by mouth daily with breakfast.    . sildenafil (REVATIO) 20 MG tablet Take 1-5 tablets (20-100 mg total) by mouth daily as needed (erection). (Patient taking differently: Take 20-100 mg by mouth daily as needed (for E.D.).) 100 tablet 11  . SPIRULINA PO Take 1 tablet by mouth daily with breakfast.    . vardenafil (LEVITRA) 20 MG tablet Take 1 tablet (20 mg total) by mouth  daily as needed for erectile dysfunction. 10 tablet 11   No current facility-administered medications for this visit.    Allergies:   Patient has no known allergies.   Social History:  The patient  reports that he has never smoked. He has never used smokeless tobacco. He reports current alcohol use. He reports that he does not use drugs.   Family History:  The patient's family history includes Brain cancer in his father; Cancer in his father and mother; Pancreatic cancer in his mother; Seizures in his father.    ROS:  Please see the history of present illness.   Otherwise, review of systems is positive for none.   All other systems are reviewed and negative.    PHYSICAL EXAM: VS:  BP 136/64   Pulse 95   Ht 6\' 7"  (2.007 m)   Wt 202 lb (91.6 kg)   SpO2 98%   BMI 22.76 kg/m  , BMI Body mass index is 22.76 kg/m. GEN: Well nourished, well developed, in no acute distress  HEENT: normal  Neck: no JVD, carotid bruits, or masses Cardiac: RRR; no murmurs, rubs, or gallops,no edema  Respiratory:  clear to auscultation bilaterally, normal work of breathing GI: soft, nontender, nondistended, + BS MS: no deformity or atrophy  Skin: warm and dry Neuro:  Strength and sensation are intact Psych: euthymic mood, full affect  EKG:  EKG is ordered today. Personal review of the ekg ordered shows atrial fibrillation  Recent Labs: 06/14/2020: TSH 5.41 09/04/2020: ALT 18 09/11/2020: BUN 19; Creatinine, Ser 0.97; Magnesium 2.0; Potassium 3.9; Sodium 139 09/21/2020: Hemoglobin 9.4; Platelets 366.0    Lipid Panel     Component Value Date/Time   CHOL 141 06/14/2020 1514   TRIG 56.0 06/14/2020 1514   HDL 39.80 06/14/2020 1514   CHOLHDL 4 06/14/2020 1514   VLDL 11.2 06/14/2020 1514   LDLCALC 90 06/14/2020 1514     Wt Readings from Last 3 Encounters:  10/06/20 202 lb (91.6 kg)  09/13/20 206 lb 9.6 oz (93.7 kg)  09/04/20 196 lb 3.4 oz (89 kg)      Other studies Reviewed: Additional  studies/ records that were reviewed today include: TTE 2015 Left ventricular systolic function is normal.  LV ejection fraction = 55%.  The left ventricular wall motion is normal.  The right ventricle is borderline dilated.  The right ventricular systolic function is borderline reduced.  The left atrium is mildly dilated.  The right atrium is mildly dilated.  There is mild mitral regurgitation.  Injection of agitated saline showed no right-to-left shunt.  There is no pericardial effusion.    ASSESSMENT AND PLAN:  1.  Paroxysmal atrial fibrillation: Currently on propranolol as needed.  CHA2DS2-VASc of 0.  Unfortunately he has had GI bleeding recently and is hesitant to start anticoagulation to get him back into normal rhythm.  I Sennie Borden discuss this with his gastroenterologist to  see when he feels it would be reasonable to start Eliquis.  He has had some rapid heart rates on his cardiac monitor.  We Papa Piercefield start him on Toprol-XL 50 mg.  We Joliet Mallozzi also get an echo to see if there is any cardiac reason for his atrial fibrillation.  He does get short of breath, and this is also to ensure that he does not have any evidence of heart failure.    Current medicines are reviewed at length with the patient today.   The patient does not have concerns regarding his medicines.  The following changes were made today: Start Toprol-XL 50 mg  Labs/ tests ordered today include:  Orders Placed This Encounter  Procedures  . CBC  . EKG 12-Lead  . ECHOCARDIOGRAM COMPLETE     Disposition:   FU with Sopheap Basic 6 weeks  Signed, Nilay Mangrum Meredith Leeds, MD  10/06/2020 12:39 PM     [facility] [address] [place] [address] [phone] (office) [phone] (fax)

## 2020-10-06 NOTE — Patient Instructions (Addendum)
Medication Instructions:  Your physician has recommended you make the following change in your medication:  1. START Metoprolol Succinate (Toprol) 50 mg once daily  *If you need a refill on your cardiac medications before your next appointment, please call your pharmacy*   Lab Work: CBC today If you have labs (blood work) drawn today and your tests are completely normal, you will receive your results only by: Marland Kitchen MyChart Message (if you have MyChart) OR . A paper copy in the mail If you have any lab test that is abnormal or we need to change your treatment, we will call you to review the results.   Testing/Procedures: Your physician has requested that you have an echocardiogram. Echocardiography is a painless test that uses sound waves to create images of your heart. It provides your doctor with information about the size and shape of your heart and how well your heart's chambers and valves are working. This procedure takes approximately one hour. There are no restrictions for this procedure.   Follow-Up: At [facility], you and your health needs are our priority.  As part of our continuing mission to provide you with exceptional heart care, we have created designated Provider Care Teams.  These Care Teams include your primary Cardiologist (physician) and Advanced Practice Providers (APPs -  Physician Assistants and Nurse Practitioners) who all work together to provide you with the care you need, when you need it.  Your next appointment:    after echocardiogram  The format for your next appointment:   In Person  Provider:   Allegra Lai, MD    Thank you for choosing [facility]!!   Trinidad Curet, RN [phone]   Other Instructions  Metoprolol Extended-Release Tablets What is this medicine? METOPROLOL (me TOE proe lole) is a beta blocker. It decreases the amount of work your heart has to do and helps your heart beat regularly. It treats high blood pressure and/or  prevent chest pain (also called angina). It also treats heart failure. This medicine may be used for other purposes; ask your health care provider or pharmacist if you have questions. COMMON BRAND NAME(S): toprol, Toprol XL What should I tell my health care provider before I take this medicine? They need to know if you have any of these conditions:  diabetes  heart or vessel disease like slow heart rate, worsening heart failure, heart block, sick sinus syndrome or Raynaud's disease  kidney disease  liver disease  lung or breathing disease, like asthma or emphysema  pheochromocytoma  thyroid disease  an unusual or allergic reaction to metoprolol, other beta-blockers, medicines, foods, dyes, or preservatives  pregnant or trying to get pregnant  breast-feeding How should I use this medicine? Take this drug by mouth. Take it as directed on the prescription label at the same time every day. Take it with food. You may cut the tablet in half if it is scored (has a line in the middle of it). This may help you swallow the tablet if the whole tablet is too big. Be sure to take both halves. Do not take just one-half of the tablet. Keep taking it unless your health care provider tells you to stop. Talk to your health care provider about the use of this drug in children. While it may be prescribed for children as young as 6 for selected conditions, precautions do apply. Overdosage: If you think you have taken too much of this medicine contact a poison control center or emergency room at once.  NOTE: This medicine is only for you. Do not share this medicine with others. What if I miss a dose? If you miss a dose, take it as soon as you can. If it is almost time for your next dose, take only that dose. Do not take double or extra doses. What may interact with this medicine? This medicine may interact with the following medications:  certain medicines for blood pressure, heart disease, irregular  heart beat  certain medicines for depression, like monoamine oxidase (MAO) inhibitors, fluoxetine, or paroxetine  clonidine  dobutamine  epinephrine  isoproterenol  reserpine This list may not describe all possible interactions. Give your health care provider a list of all the medicines, herbs, non-prescription drugs, or dietary supplements you use. Also tell them if you smoke, drink alcohol, or use illegal drugs. Some items may interact with your medicine. What should I watch for while using this medicine? Visit your doctor or health care professional for regular check ups. Contact your doctor right away if your symptoms worsen. Check your blood pressure and pulse rate regularly. Ask your health care professional what your blood pressure and pulse rate should be, and when you should contact them. You may get drowsy or dizzy. Do not drive, use machinery, or do anything that needs mental alertness until you know how this medicine affects you. Do not sit or stand up quickly, especially if you are an older patient. This reduces the risk of dizzy or fainting spells. Contact your doctor if these symptoms continue. Alcohol may interfere with the effect of this medicine. Avoid alcoholic drinks. This medicine may increase blood sugar. Ask your healthcare provider if changes in diet or medicines are needed if you have diabetes. What side effects may I notice from receiving this medicine? Side effects that you should report to your doctor or health care professional as soon as possible:  allergic reactions like skin rash, itching or hives  cold or numb hands or feet  depression  difficulty breathing  faint  fever with sore throat  irregular heartbeat, chest pain  rapid weight gain  signs and symptoms of high blood sugar such as being more thirsty or hungry or having to urinate more than normal. You may also feel very tired or have blurry vision.  swollen legs or ankles Side effects that  usually do not require medical attention (report to your doctor or health care professional if they continue or are bothersome):  anxiety or nervousness  change in sex drive or performance  dry skin  headache  nightmares or trouble sleeping  short term memory loss  stomach upset or diarrhea This list may not describe all possible side effects. Call your doctor for medical advice about side effects. You may report side effects to FDA at 1-800-FDA-1088. Where should I keep my medicine? Keep out of the reach of children and pets. Store at room temperature between 20 and 25 degrees C (68 and 77 degrees F). Throw away any unused drug after the expiration date. NOTE: This sheet is a summary. It may not cover all possible information. If you have questions about this medicine, talk to your doctor, pharmacist, or health care provider.  2021 Elsevier/Gold Standard (2018-12-25 18:23:00)

## 2020-10-07 ENCOUNTER — Telehealth: Payer: Self-pay | Admitting: *Deleted

## 2020-10-07 NOTE — Addendum Note (Signed)
Addended by: Stanton Kidney on: 10/07/2020 10:07 AM   Modules accepted: Orders

## 2020-10-07 NOTE — Telephone Encounter (Signed)
-----   Message from Will Meredith Leeds, MD sent at 10/06/2020  3:16 PM EDT ----- Start eliquis. Follow up in AF clinic in 2.5 weeks to discuss DCCV. ----- Message ----- From: Jerene Bears, MD Sent: 10/06/2020   3:14 PM EDT To: Constance Haw, MD  Will, Thanks for your message.  He definitely had a significant GI bleed but we could not find the bleeding source. I think that it is safe to proceed with cardioversion and anticoagulation. He is very diligent and will watch closely for melena going forward Thank you Ulice Dash  ----- Message ----- From: Constance Haw, MD Sent: 10/06/2020  12:26 PM EDT To: Jerene Bears, MD  I saw Mr. Jose Holden today.  He is unfortunately in atrial fibrillation and appears that he has been that way for a while.  I think that he will need a cardioversion.  He would not require long-term anticoagulation, but would need to be anticoagulated for approximately 2 months around the time of his cardioversion.  With his history of GI bleed, do think that is reasonable.  I know that he has had a negative work-up thus far.  We are checking a CBC today to make sure that his hemoglobin is stable.  Thanks.

## 2020-10-07 NOTE — Telephone Encounter (Signed)
Left message to call back   (pt did not get CBC while in office yesterday, called today to get scheduled for Monday)

## 2020-10-10 ENCOUNTER — Other Ambulatory Visit: Payer: Self-pay

## 2020-10-10 ENCOUNTER — Other Ambulatory Visit: Payer: 59 | Admitting: *Deleted

## 2020-10-10 DIAGNOSIS — I4819 Other persistent atrial fibrillation: Secondary | ICD-10-CM

## 2020-10-10 DIAGNOSIS — Z79899 Other long term (current) drug therapy: Secondary | ICD-10-CM

## 2020-10-10 LAB — CBC
Hematocrit: 32.5 % — ABNORMAL LOW (ref 37.5–51.0)
Hemoglobin: 10.5 g/dL — ABNORMAL LOW (ref 13.0–17.7)
MCH: 27.3 pg (ref 26.6–33.0)
MCHC: 32.3 g/dL (ref 31.5–35.7)
MCV: 85 fL (ref 79–97)
Platelets: 306 10*3/uL (ref 150–450)
RBC: 3.84 x10E6/uL — ABNORMAL LOW (ref 4.14–5.80)
RDW: 18.9 % — ABNORMAL HIGH (ref 11.6–15.4)
WBC: 5.6 10*3/uL (ref 3.4–10.8)

## 2020-10-10 NOTE — Telephone Encounter (Signed)
Left message to call back  

## 2020-10-10 NOTE — Telephone Encounter (Signed)
PT is returning [place] call

## 2020-10-12 ENCOUNTER — Ambulatory Visit (HOSPITAL_COMMUNITY): Payer: 59 | Admitting: Nurse Practitioner

## 2020-10-12 MED ORDER — APIXABAN 5 MG PO TABS: 5.0000 mg | ORAL_TABLET | Freq: Two times a day (BID) | ORAL | 3 refills | Status: DC

## 2020-10-12 NOTE — Telephone Encounter (Signed)
Called and spoke with patient, he is agreeable to appt 10/27/20 (pt states he is out of town prior to that day) at 2:00 pm with Adline Peals, PA-C

## 2020-10-12 NOTE — Telephone Encounter (Signed)
Pt returned my call. Spent 25 minutes on the phone w/ pt discussing/reviewing Eliquis start recommendation and rationale behind this. Answered all questions. Pt aware that I will send Rx into pharmacy.  Aware first 30 days will be free. Will send $10 co pay card info via mychart for pt to see if he qualifies w/ his insurance.  Advised to call office if there is a cost issue.  Aware I will forward note to the AFib clinic for an appt in 2 weeks per Dr. Curt Bears.  Pt aware they will perform an EKG at this visit and if still in AFib then DCCV will be scheduled for a week after that (pt understands he will need to be on the Eliquis for 3 weeks prior to [place])   Pt is agreeable to plan.

## 2020-10-14 ENCOUNTER — Telehealth: Payer: Self-pay | Admitting: Gastroenterology

## 2020-10-14 NOTE — Telephone Encounter (Signed)
Patient developed bright red bleeding with BM after starting on eliquis.  See notes from cardiology.  He is asking about stopping the Eliquis.  He has not read the last notes from cardiology instructing him to hold until OV on Monday.  I read the messages from cardiology to the patient.  He is advised to keep his appointment for Monday with Dr. Fuller Plan He understands to proceed to the ED if he has any increase in bleeding, dark red bleeding, clots, SOB, CP, or being light headed.  He thanked me for the call.

## 2020-10-14 NOTE — Telephone Encounter (Signed)
Inbound call from patient. States his blood thinners were switched and now is having blood in stool. Scheduled an appointment for 5/23. Patient would like a call back at [phone]

## 2020-10-17 ENCOUNTER — Telehealth: Payer: Self-pay | Admitting: Gastroenterology

## 2020-10-17 ENCOUNTER — Encounter: Payer: Self-pay | Admitting: Gastroenterology

## 2020-10-17 ENCOUNTER — Ambulatory Visit (INDEPENDENT_AMBULATORY_CARE_PROVIDER_SITE_OTHER): Payer: 59 | Admitting: Gastroenterology

## 2020-10-17 VITALS — BP 90/60 | HR 76 | Ht 78.0 in | Wt 202.4 lb

## 2020-10-17 DIAGNOSIS — D509 Iron deficiency anemia, unspecified: Secondary | ICD-10-CM | POA: Diagnosis not present

## 2020-10-17 DIAGNOSIS — Z7901 Long term (current) use of anticoagulants: Secondary | ICD-10-CM

## 2020-10-17 DIAGNOSIS — K921 Melena: Secondary | ICD-10-CM

## 2020-10-17 DIAGNOSIS — K648 Other hemorrhoids: Secondary | ICD-10-CM

## 2020-10-17 MED ORDER — HYDROCORTISONE ACETATE 25 MG RE SUPP: 25.0000 mg | Freq: Two times a day (BID) | RECTAL | 0 refills | Status: DC

## 2020-10-17 NOTE — Telephone Encounter (Signed)
Suppositories not covered by patient's insurance. Do you want him to use over the counter preparation H suppositories?

## 2020-10-17 NOTE — Patient Instructions (Signed)
We have sent the following medications to your pharmacy for you to pick up at your convenience: Anusol suppositories x 7 days.  Resume Eliquis on Wednesday.  Thank you for choosing me and [facility].  Pricilla Riffle. Dagoberto Ligas., MD., Marval Regal

## 2020-10-17 NOTE — Telephone Encounter (Signed)
Prep H supp (OTC) coated with 1% hydrocortisone cream (OTC).

## 2020-10-17 NOTE — Telephone Encounter (Signed)
Ladene Artist, MD  Lindon Romp, CMA After further review of his chart he is my patient. Please change his follow up GI appt to be with me and not [facility].  I performed a colonoscopy for him in 12/2019 which should have been with [facility] however it appears that I assumed his GI care at that point. JMP was correct    Rescheduled patient with Dr. Fuller Plan on 12/20/20 at 9:30am

## 2020-10-17 NOTE — Progress Notes (Signed)
    History of Present Illness: This is a 65 year old male with IDA and melena. Exentsive evaluation by Dr. Hilarie Fredrickson outlined below.  He discontinued aspirin and no longer notes melena.  After beginning Eliquis he reports small amounts of bright red blood per rectum with most bowel movements.  He was advised to stop Eliquis on Friday 5/20 and to get further advice on Eliquis following his office visit today.  He has not noted any bleeding since discontinuing Eliquis.  He relates he is not sleeping well which is a chronic problem.  He was initially seen by Alonza Bogus with Dr. Darcey Nora supervising in April 2019.  He was then scheduled for colonoscopy with me in August 2021, not Dr. Rush Landmark for unclear reasons.   Colon 08/2020 - Preparation of the colon was fair due to adherent heme/coffee-ground material. - The examined portion of the ileum was normal. - Blood (coffee-ground) in the entire examined colon without red blood, active bleeding or vascular/polypoid lesions. - No diverticulosis was seen. - Small, non-bleeding internal hemorrhoids. - No specimens collected.   EGD 08/2020 - Normal mucosa was found in the entire esophagus. - Non-obstructing and partial Schatzki's ring. - Normal stomach - Normal examined duodenum. - No specimens collected.  08/2020 VCE Normal stomach and small bowel Dark stool/melenic stool in the colon  08/2020 Meckel scan Negative  Current Medications, Allergies, Past Medical History, Past Surgical History, Family History and Social History were reviewed in [employer] record.   Physical Exam: General: Well developed, well nourished, no acute distress Head: Normocephalic and atraumatic Eyes: Sclerae anicteric, EOMI Ears: Normal auditory acuity Mouth: Not examined, mask on during Covid-19 pandemic Lungs: Clear throughout to auscultation Heart: Regular rate and rhythm; no murmurs, rubs or bruits Abdomen: Soft, non tender and non  distended. No masses, hepatosplenomegaly or hernias noted. Normal Bowel sounds Rectal: Not done Musculoskeletal: Symmetrical with no gross deformities  Pulses:  Normal pulses noted Extremities: No clubbing, cyanosis, edema or deformities noted Neurological: Alert oriented x 4, grossly nonfocal Psychological:  Alert and cooperative. Normal mood and affect   Assessment and Recommendations:  1.  Small-volume hematochezia with bowel movements due to known small internal hemorrhoids present on colonoscopies in 2021 and 2022.  Increase dietary fiber intake.  Increase daily water intake.  Begin a stool softener daily.  Avoid straining with BMs.  Anusol HC suppositories PR qd for 7 days and then qd as needed for hemorrhoidal symptoms.  Resume Eliquis on Wednesday 5/25. REV in 2 months.  2. IDA. History of melena.  Extensive evaluation as outlined above.  Continue FeSO4 325 mg daily after a meal.  Patient feels that iron twice daily is interfering with his sleep.  CBC, iron, TIBC and ferritin on or about July 29.  3.  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  A 3-year interval surveillance colonoscopy is recommended in August 2024.  4. Afib. Follow up with Cardiology. Resume Eliquis as above.

## 2020-10-17 NOTE — Telephone Encounter (Signed)
Inbound call from patient stating suppositories are not covered by his insurance and is requesting alternate medication be sent to the pharmacy.  Please advise.

## 2020-10-17 NOTE — Telephone Encounter (Signed)
Informed patient of Dr. Stark's recommendations. Patient verbalized understanding. 

## 2020-10-19 ENCOUNTER — Telehealth: Payer: Self-pay | Admitting: Cardiology

## 2020-10-19 NOTE — Telephone Encounter (Signed)
Dr. Curt Bears -- Propranolol is PRN               Take 1 tablet (20 mg total) by mouth 2 (two) times daily as needed (for performance-induced stress).  Still advise not to take or take PRN with parameters given earlier today to not take if HR less than 60? Please advise

## 2020-10-19 NOTE — Telephone Encounter (Signed)
Pt had concerns re Metoprolol and Propranolol. Pt  Instructed to not take at the same time and to check HR prior to taking Propranolol and if HR less than 60 not to take Pt verbalizes understanding Will forward to Dr Curt Bears for review .

## 2020-10-19 NOTE — Telephone Encounter (Signed)
Pt c/o medication issue:  1. Name of Medication: metoprolol succinate (TOPROL-XL) 50 MG 24 hr tablet propranolol (INDERAL) 20 MG tablet  2. How are you currently taking this medication (dosage and times per day)?  As directed 3. Are you having a reaction (difficulty breathing--STAT)? No  4. What is your medication issue? Pt would like to know is it okay to take both of these meds together?

## 2020-10-27 ENCOUNTER — Ambulatory Visit (HOSPITAL_COMMUNITY)
Admission: RE | Admit: 2020-10-27 | Discharge: 2020-10-27 | Disposition: A | Discharge status: Home / Self Care | Payer: 59 | Source: Ambulatory Visit | Attending: Physician Assistant | Admitting: Physician Assistant

## 2020-10-27 ENCOUNTER — Other Ambulatory Visit: Payer: Self-pay

## 2020-10-27 VITALS — BP 106/66 | HR 72 | Ht 78.0 in | Wt 200.8 lb

## 2020-10-27 DIAGNOSIS — Z79899 Other long term (current) drug therapy: Secondary | ICD-10-CM | POA: Diagnosis not present

## 2020-10-27 DIAGNOSIS — I4510 Unspecified right bundle-branch block: Secondary | ICD-10-CM | POA: Diagnosis not present

## 2020-10-27 DIAGNOSIS — Z7901 Long term (current) use of anticoagulants: Secondary | ICD-10-CM | POA: Insufficient documentation

## 2020-10-27 DIAGNOSIS — I4819 Other persistent atrial fibrillation: Secondary | ICD-10-CM | POA: Insufficient documentation

## 2020-10-27 LAB — BASIC METABOLIC PANEL
Anion gap: 8 (ref 5–15)
BUN: 24 mg/dL — ABNORMAL HIGH (ref 8–23)
CO2: 24 mmol/L (ref 22–32)
Calcium: 9.1 mg/dL (ref 8.9–10.3)
Chloride: 107 mmol/L (ref 98–111)
Creatinine, Ser: 0.88 mg/dL (ref 0.61–1.24)
GFR, Estimated: >60 mL/min (ref >60)
Glucose, Bld: 97 mg/dL (ref 70–99)
Potassium: 4.1 mmol/L (ref 3.5–5.1)
Sodium: 139 mmol/L (ref 135–145)

## 2020-10-27 LAB — CBC
HCT: 43.4 % (ref 39.0–52.0)
Hemoglobin: 13.4 g/dL (ref 13.0–17.0)
MCH: 28.7 pg (ref 26.0–34.0)
MCHC: 30.9 g/dL (ref 30.0–36.0)
MCV: 92.9 fL (ref 80.0–100.0)
Platelets: 266 10*3/uL (ref 150–400)
RBC: 4.67 MIL/uL (ref 4.22–5.81)
RDW: 19.9 % — ABNORMAL HIGH (ref 11.5–15.5)
WBC: 5 10*3/uL (ref 4.0–10.5)
nRBC: 0 % (ref 0.0–0.2)

## 2020-10-27 NOTE — H&P (View-Only) (Signed)
Primary Care Physician: Hoyt Koch, MD Primary Electrophysiologist: Dr Curt Bears Referring Physician: Dr Alma Downs Jose Holden is a 65 y.o. male with a history of GI bleeding, retinal detachment, and atrial fibrillation who presents for follow up in the [facility].  The patient was initially diagnosed with atrial fibrillation 09/2020 on a cardiac monitor placed by his PCP. Patient is on Eliquis for a CHADS2VASC score of 0. He did have some bright red blood per rectum after BM since starting Eliquis. He has known internal hemorrhoids. His Eliquis was resumed on 10/19/20. He has been using hemorrhoid cream which resolved the bleeding. He is fairly asymptomatic with his afib but admits he feels "jittery".   Today, he denies symptoms of palpitations, chest pain, shortness of breath, orthopnea, PND, lower extremity edema, dizziness, presyncope, syncope, snoring, daytime somnolence, bleeding, or neurologic sequela. The patient is tolerating medications without difficulties and is otherwise without complaint today.    Atrial Fibrillation Risk Factors:  he does not have symptoms or diagnosis of sleep apnea. he does not have a history of rheumatic fever.   he has a BMI of Body mass index is 23.2 kg/m.Marland Kitchen Filed Weights   10/27/20 1343  Weight: 91.1 kg    Family History  Problem Relation Age of Onset  . Cancer Mother        Pancreas CA  . Pancreatic cancer Mother   . Cancer Father        Brain CA  . Brain cancer Father   . Seizures Father        grand mal  . Colon cancer Neg Hx   . Esophageal cancer Neg Hx   . Rectal cancer Neg Hx   . Colon polyps Neg Hx   . Stomach cancer Neg Hx      Atrial Fibrillation Management history:  Previous antiarrhythmic drugs: none Previous cardioversions: none Previous ablations: none CHADS2VASC score: 0 Anticoagulation history: Eliquis   Past Medical History:  Diagnosis Date  . A-fib (HCC)    On  Aspirin only  . Anxiety   . Cataract    removed left eye that caused retinal detachment x2  . ED (erectile dysfunction)   . Heart murmur    past hx per pt   . Lactose intolerance   . Retinal detachment    x2- left eye    Past Surgical History:  Procedure Laterality Date  . APPENDECTOMY    . COLONOSCOPY     10 yr ago [place]- normal per pt   . COLONOSCOPY WITH PROPOFOL N/A 09/07/2020   Procedure: COLONOSCOPY WITH PROPOFOL;  Surgeon: Jerene Bears, MD;  Location: [place];  Service: Gastroenterology;  Laterality: N/A;  . ESOPHAGOGASTRODUODENOSCOPY (EGD) WITH PROPOFOL N/A 09/05/2020   Procedure: ESOPHAGOGASTRODUODENOSCOPY (EGD) WITH PROPOFOL;  Surgeon: Jerene Bears, MD;  Location: [facility] ENDOSCOPY;  Service: Gastroenterology;  Laterality: N/A;  . EYE SURGERY     detached retina surgery x2  . GIVENS CAPSULE STUDY N/A 09/05/2020   Procedure: GIVENS CAPSULE STUDY;  Surgeon: Jerene Bears, MD;  Location: [place];  Service: Gastroenterology;  Laterality: N/A;  . GIVENS CAPSULE STUDY N/A 09/10/2020   Procedure: GIVENS CAPSULE STUDY;  Surgeon: Jerene Bears, MD;  Location: [place];  Service: Gastroenterology;  Laterality: N/A;  . LAPAROSCOPIC APPENDECTOMY N/A 08/29/2015   Procedure: APPENDECTOMY LAPAROSCOPIC;  Surgeon: Johnathan Hausen, MD;  Location: [facility];  Service: General;  Laterality: N/A;  . UPPER GASTROINTESTINAL ENDOSCOPY  Current Outpatient Medications  Medication Sig Dispense Refill  . apixaban (ELIQUIS) 5 MG TABS tablet Take 1 tablet (5 mg total) by mouth 2 (two) times daily. (Patient not taking: Reported on 10/17/2020) 60 tablet 3  . Cholecalciferol (VITAMIN D-3 PO) Take 1 tablet by mouth daily with breakfast.    . ferrous sulfate ([employer] IRON) 325 (65 FE) MG tablet Take 1 tablet (325 mg total) by mouth 2 (two) times daily with a meal.  3  . hydrocortisone (ANUSOL-HC) 25 MG suppository Place 1 suppository (25 mg total) rectally 2 (two) times daily. 14 suppository 0  .  metoprolol succinate (TOPROL-XL) 50 MG 24 hr tablet Take 1 tablet (50 mg total) by mouth daily. Take with or immediately following a meal. 30 tablet 3  . propranolol (INDERAL) 20 MG tablet Take 1 tablet (20 mg total) by mouth 2 (two) times daily as needed (for performance-induced stress).    . SELENIUM PO Take 1 tablet by mouth daily with breakfast.    . sildenafil (REVATIO) 20 MG tablet Take 1-5 tablets (20-100 mg total) by mouth daily as needed (erection). (Patient taking differently: Take 20-100 mg by mouth daily as needed (for E.D.).) 100 tablet 11  . SPIRULINA PO Take 1 tablet by mouth daily with breakfast.    . vardenafil (LEVITRA) 20 MG tablet Take 1 tablet (20 mg total) by mouth daily as needed for erectile dysfunction. 10 tablet 11   No current facility-administered medications for this encounter.    No Known Allergies  Social History   Socioeconomic History  . Marital status: Single    Spouse name: Not on file  . Number of children: 0  . Years of education: Not on file  . Highest education level: Not on file  Occupational History  . Occupation: classical Facilities manager  Tobacco Use  . Smoking status: Never Smoker  . Smokeless tobacco: Never Used  Vaping Use  . Vaping Use: Never used  Substance and Sexual Activity  . Alcohol use: Yes    Alcohol/week: 0.0 standard drinks    Comment: mod- rare per pt 09-10-2019  . Drug use: Never  . Sexual activity: Not Currently  Other Topics Concern  . Not on file  Social History Narrative  . Not on file   Social Determinants of Health   Financial Resource Strain: Not on file  Food Insecurity: Not on file  Transportation Needs: Not on file  Physical Activity: Not on file  Stress: Not on file  Social Connections: Not on file  Intimate Partner Violence: Not on file     ROS- All systems are reviewed and negative except as per the HPI above.  Physical Exam: Vitals:   10/27/20 1343  Weight: 91.1 kg  Height: 6\' 6"  (1.981 m)     GEN- The patient is a well appearing male, alert and oriented x 3 today.   Head- normocephalic, atraumatic Eyes-  Sclera clear, conjunctiva pink Ears- hearing intact Oropharynx- clear Neck- supple  Lungs- Clear to ausculation bilaterally, normal work of breathing Heart- irregular rate and rhythm, no murmurs, rubs or gallops  GI- soft, NT, ND, + BS Extremities- no clubbing, cyanosis, or edema MS- no significant deformity or atrophy Skin- no rash or lesion Psych- euthymic mood, full affect Neuro- strength and sensation are intact  Wt Readings from Last 3 Encounters:  10/27/20 91.1 kg  10/17/20 91.8 kg  10/06/20 91.6 kg    EKG today demonstrates  Afib Vent. rate 72 BPM PR interval * ms  QRS duration 96 ms QT/QTcB 386/422 ms  Epic records are reviewed at length today  CHA2DS2-VASc Score = 0  The patient's score is based upon: CHF History: No HTN History: No Diabetes History: No Stroke History: No Vascular Disease History: No Age Score: 0 Gender Score: 0      ASSESSMENT AND PLAN: 1. Persistent Atrial Fibrillation (ICD10:  I48.19) The patient's CHA2DS2-VASc score is 0, indicating a 0.2% annual risk of stroke.   We discussed therapeutic options today. Will plan for DCCV after 3 weeks of uninterrupted anticoagulation. (Eliquis resumed 5/25)  Check bmet/cbc today.  Continue Eliquis 5 mg BID for now. Anticipate this will be short term post DCCV.  Continue Toprol 50 mg daily   Follow up with Dr Curt Bears post DCCV.    [facility] [address] [place],  [address] [phone] 10/27/2020 1:52 PM

## 2020-10-27 NOTE — Patient Instructions (Signed)
Cardioversion scheduled for Wednesday June 15  - Arrive at the [facility] and go to admitting at [place] not eat or drink anything after midnight the night prior to your procedure.  - Take all your morning medication with a sip of water prior to arrival.  - You will not be able to drive home after your procedure.

## 2020-10-27 NOTE — Progress Notes (Signed)
Primary Care Physician: Hoyt Koch, MD Primary Electrophysiologist: Dr Curt Bears Referring Physician: Dr Alma Downs Jose Holden is a 65 y.o. male with a history of GI bleeding, retinal detachment, and atrial fibrillation who presents for follow up in the [facility].  The patient was initially diagnosed with atrial fibrillation 09/2020 on a cardiac monitor placed by his PCP. Patient is on Eliquis for a CHADS2VASC score of 0. He did have some bright red blood per rectum after BM since starting Eliquis. He has known internal hemorrhoids. His Eliquis was resumed on 10/19/20. He has been using hemorrhoid cream which resolved the bleeding. He is fairly asymptomatic with his afib but admits he feels "jittery".   Today, he denies symptoms of palpitations, chest pain, shortness of breath, orthopnea, PND, lower extremity edema, dizziness, presyncope, syncope, snoring, daytime somnolence, bleeding, or neurologic sequela. The patient is tolerating medications without difficulties and is otherwise without complaint today.    Atrial Fibrillation Risk Factors:  he does not have symptoms or diagnosis of sleep apnea. he does not have a history of rheumatic fever.   he has a BMI of Body mass index is 23.2 kg/m.Marland Kitchen Filed Weights   10/27/20 1343  Weight: 91.1 kg    Family History  Problem Relation Age of Onset  . Cancer Mother        Pancreas CA  . Pancreatic cancer Mother   . Cancer Father        Brain CA  . Brain cancer Father   . Seizures Father        grand mal  . Colon cancer Neg Hx   . Esophageal cancer Neg Hx   . Rectal cancer Neg Hx   . Colon polyps Neg Hx   . Stomach cancer Neg Hx      Atrial Fibrillation Management history:  Previous antiarrhythmic drugs: none Previous cardioversions: none Previous ablations: none CHADS2VASC score: 0 Anticoagulation history: Eliquis   Past Medical History:  Diagnosis Date  . A-fib (HCC)    On  Aspirin only  . Anxiety   . Cataract    removed left eye that caused retinal detachment x2  . ED (erectile dysfunction)   . Heart murmur    past hx per pt   . Lactose intolerance   . Retinal detachment    x2- left eye    Past Surgical History:  Procedure Laterality Date  . APPENDECTOMY    . COLONOSCOPY     10 yr ago [place]- normal per pt   . COLONOSCOPY WITH PROPOFOL N/A 09/07/2020   Procedure: COLONOSCOPY WITH PROPOFOL;  Surgeon: Jerene Bears, MD;  Location: [place];  Service: Gastroenterology;  Laterality: N/A;  . ESOPHAGOGASTRODUODENOSCOPY (EGD) WITH PROPOFOL N/A 09/05/2020   Procedure: ESOPHAGOGASTRODUODENOSCOPY (EGD) WITH PROPOFOL;  Surgeon: Jerene Bears, MD;  Location: [facility] ENDOSCOPY;  Service: Gastroenterology;  Laterality: N/A;  . EYE SURGERY     detached retina surgery x2  . GIVENS CAPSULE STUDY N/A 09/05/2020   Procedure: GIVENS CAPSULE STUDY;  Surgeon: Jerene Bears, MD;  Location: [place];  Service: Gastroenterology;  Laterality: N/A;  . GIVENS CAPSULE STUDY N/A 09/10/2020   Procedure: GIVENS CAPSULE STUDY;  Surgeon: Jerene Bears, MD;  Location: [place];  Service: Gastroenterology;  Laterality: N/A;  . LAPAROSCOPIC APPENDECTOMY N/A 08/29/2015   Procedure: APPENDECTOMY LAPAROSCOPIC;  Surgeon: Johnathan Hausen, MD;  Location: [facility];  Service: General;  Laterality: N/A;  . UPPER GASTROINTESTINAL ENDOSCOPY  Current Outpatient Medications  Medication Sig Dispense Refill  . apixaban (ELIQUIS) 5 MG TABS tablet Take 1 tablet (5 mg total) by mouth 2 (two) times daily. (Patient not taking: Reported on 10/17/2020) 60 tablet 3  . Cholecalciferol (VITAMIN D-3 PO) Take 1 tablet by mouth daily with breakfast.    . ferrous sulfate ([employer]S IRON) 325 (65 FE) MG tablet Take 1 tablet (325 mg total) by mouth 2 (two) times daily with a meal.  3  . hydrocortisone (ANUSOL-HC) 25 MG suppository Place 1 suppository (25 mg total) rectally 2 (two) times daily. 14 suppository 0  .  metoprolol succinate (TOPROL-XL) 50 MG 24 hr tablet Take 1 tablet (50 mg total) by mouth daily. Take with or immediately following a meal. 30 tablet 3  . propranolol (INDERAL) 20 MG tablet Take 1 tablet (20 mg total) by mouth 2 (two) times daily as needed (for performance-induced stress).    . SELENIUM PO Take 1 tablet by mouth daily with breakfast.    . sildenafil (REVATIO) 20 MG tablet Take 1-5 tablets (20-100 mg total) by mouth daily as needed (erection). (Patient taking differently: Take 20-100 mg by mouth daily as needed (for E.D.).) 100 tablet 11  . SPIRULINA PO Take 1 tablet by mouth daily with breakfast.    . vardenafil (LEVITRA) 20 MG tablet Take 1 tablet (20 mg total) by mouth daily as needed for erectile dysfunction. 10 tablet 11   No current facility-administered medications for this encounter.    No Known Allergies  Social History   Socioeconomic History  . Marital status: Single    Spouse name: Not on file  . Number of children: 0  . Years of education: Not on file  . Highest education level: Not on file  Occupational History  . Occupation: classical Facilities manager  Tobacco Use  . Smoking status: Never Smoker  . Smokeless tobacco: Never Used  Vaping Use  . Vaping Use: Never used  Substance and Sexual Activity  . Alcohol use: Yes    Alcohol/week: 0.0 standard drinks    Comment: mod- rare per pt 09-10-2019  . Drug use: Never  . Sexual activity: Not Currently  Other Topics Concern  . Not on file  Social History Narrative  . Not on file   Social Determinants of Health   Financial Resource Strain: Not on file  Food Insecurity: Not on file  Transportation Needs: Not on file  Physical Activity: Not on file  Stress: Not on file  Social Connections: Not on file  Intimate Partner Violence: Not on file     ROS- All systems are reviewed and negative except as per the HPI above.  Physical Exam: Vitals:   10/27/20 1343  Weight: 91.1 kg  Height: 6\' 6"  (1.981 m)     GEN- The patient is a well appearing male, alert and oriented x 3 today.   Head- normocephalic, atraumatic Eyes-  Sclera clear, conjunctiva pink Ears- hearing intact Oropharynx- clear Neck- supple  Lungs- Clear to ausculation bilaterally, normal work of breathing Heart- irregular rate and rhythm, no murmurs, rubs or gallops  GI- soft, NT, ND, + BS Extremities- no clubbing, cyanosis, or edema MS- no significant deformity or atrophy Skin- no rash or lesion Psych- euthymic mood, full affect Neuro- strength and sensation are intact  Wt Readings from Last 3 Encounters:  10/27/20 91.1 kg  10/17/20 91.8 kg  10/06/20 91.6 kg    EKG today demonstrates  Afib Vent. rate 72 BPM PR interval * ms  QRS duration 96 ms QT/QTcB 386/422 ms  Epic records are reviewed at length today  CHA2DS2-VASc Score = 0  The patient's score is based upon: CHF History: No HTN History: No Diabetes History: No Stroke History: No Vascular Disease History: No Age Score: 0 Gender Score: 0      ASSESSMENT AND PLAN: 1. Persistent Atrial Fibrillation (ICD10:  I48.19) The patient's CHA2DS2-VASc score is 0, indicating a 0.2% annual risk of stroke.   We discussed therapeutic options today. Will plan for DCCV after 3 weeks of uninterrupted anticoagulation. (Eliquis resumed 5/25)  Check bmet/cbc today.  Continue Eliquis 5 mg BID for now. Anticipate this will be short term post DCCV.  Continue Toprol 50 mg daily   Follow up with Dr Curt Bears post DCCV.    [facility] [address] [place], [place] [address] [phone] 10/27/2020 1:52 PM

## 2020-10-28 ENCOUNTER — Other Ambulatory Visit (HOSPITAL_COMMUNITY): Payer: Self-pay | Admitting: *Deleted

## 2020-10-31 ENCOUNTER — Telehealth: Payer: Self-pay | Admitting: Cardiology

## 2020-10-31 NOTE — Telephone Encounter (Signed)
Patient called to see if the echo test on Thursday is still needed but he had afib appt on last week he stated and everything was fine. Patient also whats to know the difference between ekg and echo. Please advise

## 2020-10-31 NOTE — Telephone Encounter (Signed)
Answered via my chart

## 2020-11-01 ENCOUNTER — Ambulatory Visit: Payer: 59 | Admitting: Cardiology

## 2020-11-03 ENCOUNTER — Ambulatory Visit (HOSPITAL_COMMUNITY): Payer: 59 | Attending: Cardiology

## 2020-11-03 ENCOUNTER — Other Ambulatory Visit: Payer: Self-pay

## 2020-11-03 DIAGNOSIS — I4819 Other persistent atrial fibrillation: Secondary | ICD-10-CM | POA: Diagnosis not present

## 2020-11-03 LAB — ECHOCARDIOGRAM COMPLETE
Area-P 1/2: 3.5 cm2
MV M vel: 4.07 m/s
MV Peak grad: 66.3 mmHg
S' Lateral: 3.7 cm

## 2020-11-07 ENCOUNTER — Ambulatory Visit: Payer: 59 | Admitting: Cardiology

## 2020-11-07 ENCOUNTER — Telehealth: Payer: Self-pay | Admitting: Gastroenterology

## 2020-11-07 NOTE — Telephone Encounter (Signed)
Patient called states he had a colonoscopy\EGD procedure done and his insurance is not covering due to codes submitted. He also stated he did two capsules while he was at the hospital. He was provided with a 91110 code for description of GI track capsule but they only show one procedure done and 43235 which he was advised by insurance that the code in not valid. Patient is requesting to speak a nurse to possibly get a better understanding on what was coded for and to also pass the message on to Dr. Hilarie Fredrickson.

## 2020-11-09 ENCOUNTER — Encounter (HOSPITAL_COMMUNITY)
Admission: RE | Disposition: A | Discharge status: Home / Self Care | Payer: Self-pay | Source: Home / Self Care | Attending: Cardiovascular Disease

## 2020-11-09 ENCOUNTER — Ambulatory Visit (HOSPITAL_COMMUNITY): Payer: 59 | Admitting: Anesthesiology

## 2020-11-09 ENCOUNTER — Encounter (HOSPITAL_COMMUNITY): Payer: Self-pay | Admitting: Cardiovascular Disease

## 2020-11-09 ENCOUNTER — Ambulatory Visit (HOSPITAL_COMMUNITY)
Admission: RE | Admit: 2020-11-09 | Discharge: 2020-11-09 | Disposition: A | Discharge status: Home / Self Care | Payer: 59 | Attending: Cardiovascular Disease | Admitting: Cardiovascular Disease

## 2020-11-09 DIAGNOSIS — I4819 Other persistent atrial fibrillation: Secondary | ICD-10-CM

## 2020-11-09 DIAGNOSIS — Z79899 Other long term (current) drug therapy: Secondary | ICD-10-CM | POA: Insufficient documentation

## 2020-11-09 DIAGNOSIS — Z7901 Long term (current) use of anticoagulants: Secondary | ICD-10-CM | POA: Insufficient documentation

## 2020-11-09 HISTORY — PX: CARDIOVERSION: SHX1299

## 2020-11-09 SURGERY — CARDIOVERSION
Anesthesia: General

## 2020-11-09 MED ORDER — PROPOFOL 10 MG/ML IV BOLUS
INTRAVENOUS | Status: DC | PRN
  Administered 2020-11-09: 20 mg via INTRAVENOUS
  Administered 2020-11-09: 30 mg via INTRAVENOUS
  Administered 2020-11-09: 50 mg via INTRAVENOUS

## 2020-11-09 MED ORDER — SODIUM CHLORIDE 0.9 % IV SOLN: INTRAVENOUS | Status: DC | PRN

## 2020-11-09 MED ORDER — LIDOCAINE HCL (CARDIAC) PF 100 MG/5ML IV SOSY
PREFILLED_SYRINGE | INTRAVENOUS | Status: DC | PRN
  Administered 2020-11-09: 60 mg via INTRATRACHEAL

## 2020-11-09 NOTE — CV Procedure (Signed)
Electrical Cardioversion Procedure Note Kannan Proia [id number] 1955-08-29  Procedure: Electrical Cardioversion Indications:  Atrial Fibrillation  Procedure Details Consent: Risks of procedure as well as the alternatives and risks of each were explained to the (patient/caregiver).  Consent for procedure obtained. Time Out: Verified patient identification, verified procedure, site/side was marked, verified correct patient position, special equipment/implants available, medications/allergies/relevent history reviewed, required imaging and test results available.  Performed  Patient placed on cardiac monitor, pulse oximetry, supplemental oxygen as necessary.  Sedation given:  propofol Pacer pads placed anterior and posterior chest.  Cardioverted 3 time(s).  Cardioverted at 150J briefly successful but patient reverted to atrial fibrillation in <1 minute.  Attempts at [place] with pressure applied were unsuccessful.  Evaluation Findings: Post procedure EKG shows: Atrial Fibrillation Complications: None Patient did tolerate procedure well.   Skeet Latch, MD 11/09/2020, 12:21 PM

## 2020-11-09 NOTE — Anesthesia Procedure Notes (Signed)
Procedure Name: General with mask airway Date/Time: 11/09/2020 11:35 AM Performed by: Kathryne Hitch, CRNA Pre-anesthesia Checklist: Patient identified, Emergency Drugs available, Suction available, Patient being monitored and Timeout performed Patient Re-evaluated:Patient Re-evaluated prior to induction Oxygen Delivery Method: Simple face mask Preoxygenation: Pre-oxygenation with 100% oxygen Induction Type: IV induction Dental Injury: Teeth and Oropharynx as per pre-operative assessment

## 2020-11-09 NOTE — Discharge Instructions (Signed)
Electrical Cardioversion  Electrical cardioversion is the delivery of a jolt of electricity to restore a normal rhythm to the heart. A rhythm that is too fast or is not regular keeps the heart from pumping well. In this procedure, sticky patches or metal paddles are placed on the chest to deliver electricity to the heart from a device.  What can I expect after the procedure?  Your blood pressure, heart rate, breathing rate, and blood oxygen level will be monitored until you leave the hospital or clinic.  Your heart rhythm will be watched to make sure it does not change.  You may have some redness on the skin where the shocks were given.If this occurs, can use hydrocortisone cream or Aloe vera.  Follow these instructions at home:  Do not drive for 24 hours if you were given a sedative during your procedure.  Take over-the-counter and prescription medicines only as told by your health care provider.  Ask your health care provider how to check your pulse. Check it often.  Rest for 48 hours after the procedure or as told by your health care provider.  Avoid or limit your caffeine use as told by your health care provider.  Keep all follow-up visits as told by your health care provider. This is important.  Contact a health care provider if:  You feel like your heart is beating too quickly or your pulse is not regular.  You have a serious muscle cramp that does not go away.  Get help right away if:  You have discomfort in your chest.  You are dizzy or you feel faint.  You have trouble breathing or you are short of breath.  Your speech is slurred.  You have trouble moving an arm or leg on one side of your body.  Your fingers or toes turn cold or blue.  Summary  Electrical cardioversion is the delivery of a jolt of electricity to restore a normal rhythm to the heart.  This procedure may be done right away in an emergency or may be a scheduled procedure if the condition is not  an emergency.  Generally, this is a safe procedure.  After the procedure, check your pulse often as told by your health care provider.  This information is not intended to replace advice given to you by your health care provider. Make sure you discuss any questions you have with your health care provider. Document Revised: 12/15/2018 Document Reviewed: 12/15/2018 Elsevier Patient Education  2021 Elsevier Inc.  

## 2020-11-09 NOTE — Anesthesia Preprocedure Evaluation (Addendum)
Anesthesia Evaluation  Patient identified by MRN, date of birth, ID band Patient awake    Reviewed: Allergy & Precautions, NPO status , Patient's Chart, lab work & pertinent test results, reviewed documented beta blocker date and time   Airway Mallampati: II  TM Distance: >3 FB Neck ROM: Full    Dental no notable dental hx. (+) Teeth Intact, Dental Advisory Given   Pulmonary neg pulmonary ROS,    Pulmonary exam normal breath sounds clear to auscultation       Cardiovascular Normal cardiovascular exam+ dysrhythmias Atrial Fibrillation + Valvular Problems/Murmurs MR  Rhythm:Irregular Rate:Normal  TTE 2022 1. Left ventricular ejection fraction, by estimation, is 50 to 55%. The  left ventricle has low normal function. The left ventricle has no regional  wall motion abnormalities. There is mild left ventricular hypertrophy.  Left ventricular diastolic  parameters are indeterminate.  2. Right ventricular systolic function is mildly reduced. The right  ventricular size is normal. There is normal pulmonary artery systolic  pressure. The estimated right ventricular systolic pressure is 84.1 mmHg.  3. Left atrial size was severely dilated.  4. Right atrial size was severely dilated.  5. The mitral valve is normal in structure. Moderate mitral valve  regurgitation. Central MR jet. Suspect atrial functional MR in setting of  severe LA dilatation from Afib  6. The aortic valve is tricuspid. Aortic valve regurgitation is not  visualized. No aortic stenosis is present.  7. Aortic dilatation noted. There is dilatation of the ascending aorta,  measuring 40 mm.  8. The inferior vena cava is normal in size with greater than 50%  respiratory variability, suggesting right atrial pressure of 3 mmHg.    Neuro/Psych PSYCHIATRIC DISORDERS Anxiety negative neurological ROS     GI/Hepatic negative GI ROS, Neg liver ROS,   Endo/Other   negative endocrine ROS  Renal/GU negative Renal ROS  negative genitourinary   Musculoskeletal negative musculoskeletal ROS (+)   Abdominal   Peds  Hematology  (+) Blood dyscrasia (on eliquis), ,   Anesthesia Other Findings   Reproductive/Obstetrics                            Anesthesia Physical Anesthesia Plan  ASA: 3  Anesthesia Plan: General   Post-op Pain Management:    Induction: Intravenous  PONV Risk Score and Plan: 2 and Propofol infusion and Treatment may vary due to age or medical condition  Airway Management Planned: Natural Airway  Additional Equipment:   Intra-op Plan:   Post-operative Plan:   Informed Consent: I have reviewed the patients History and Physical, chart, labs and discussed the procedure including the risks, benefits and alternatives for the proposed anesthesia with the patient or authorized representative who has indicated his/her understanding and acceptance.     Dental advisory given  Plan Discussed with: CRNA  Anesthesia Plan Comments:         Anesthesia Quick Evaluation

## 2020-11-09 NOTE — Anesthesia Postprocedure Evaluation (Signed)
Anesthesia Post Note  Patient: Jose Holden  Procedure(s) Performed: CARDIOVERSION     Patient location during evaluation: Endoscopy Anesthesia Type: General Level of consciousness: awake and alert Pain management: pain level controlled Vital Signs Assessment: post-procedure vital signs reviewed and stable Respiratory status: spontaneous breathing, nonlabored ventilation, respiratory function stable and patient connected to nasal cannula oxygen Cardiovascular status: blood pressure returned to baseline and stable Postop Assessment: no apparent nausea or vomiting Anesthetic complications: no   No notable events documented.  Last Vitals:  Vitals:   11/09/20 1225 11/09/20 1240  BP: 117/71 103/68  Pulse: 77 66  Resp: 13 14  Temp: 36.8 C   SpO2: 98% 95%    Last Pain:  Vitals:   11/09/20 1240  TempSrc:   PainSc: 0-No pain                 Saga Balthazar L Perris Tripathi

## 2020-11-09 NOTE — Transfer of Care (Signed)
Immediate Anesthesia Transfer of Care Note  Patient: Jose Holden  Procedure(s) Performed: CARDIOVERSION  Patient Location: Endoscopy Unit  Anesthesia Type:General  Level of Consciousness: drowsy and patient cooperative  Airway & Oxygen Therapy: Patient Spontanous Breathing and Patient connected to face mask oxygen  Post-op Assessment: Report given to RN and Post -op Vital signs reviewed and stable  Post vital signs: Reviewed and stable  Last Vitals:  Vitals Value Taken Time  BP    Temp    Pulse    Resp    SpO2      Last Pain:  Vitals:   11/09/20 1124  TempSrc: Temporal         Complications: No notable events documented.

## 2020-11-09 NOTE — Interval H&P Note (Signed)
History and Physical Interval Note:  11/09/2020 11:28 AM  Jose Holden  has presented today for surgery, with the diagnosis of AFIB.  The various methods of treatment have been discussed with the patient and family. After consideration of risks, benefits and other options for treatment, the patient has consented to  Procedure(s): CARDIOVERSION (N/A) as a surgical intervention.  The patient's history has been reviewed, patient examined, no change in status, stable for surgery.  I have reviewed the patient's chart and labs.  Questions were answered to the patient's satisfaction.     Skeet Latch, MD

## 2020-11-16 ENCOUNTER — Telehealth: Payer: Self-pay | Admitting: Internal Medicine

## 2020-11-16 NOTE — Telephone Encounter (Signed)
  Patient called for appointment, when asked reason for appointment, Patient states its "not my business why he wants his hemoglobin checked"  Declined next available on  6/27   Please call

## 2020-11-17 NOTE — Telephone Encounter (Signed)
Called patient. LVM asking the patient to return my call to discuss. Office number was provided.

## 2020-11-22 ENCOUNTER — Encounter: Payer: Self-pay | Admitting: Cardiology

## 2020-11-22 ENCOUNTER — Other Ambulatory Visit: Payer: Self-pay

## 2020-11-22 ENCOUNTER — Ambulatory Visit (INDEPENDENT_AMBULATORY_CARE_PROVIDER_SITE_OTHER): Payer: 59 | Admitting: Cardiology

## 2020-11-22 VITALS — BP 118/60 | HR 69 | Ht 79.0 in | Wt 203.0 lb

## 2020-11-22 DIAGNOSIS — Z79899 Other long term (current) drug therapy: Secondary | ICD-10-CM | POA: Diagnosis not present

## 2020-11-22 DIAGNOSIS — I4819 Other persistent atrial fibrillation: Secondary | ICD-10-CM

## 2020-11-22 NOTE — Patient Instructions (Signed)
Medication Instructions:  Your physician recommends that you continue on your current medications as directed. Please refer to the Current Medication list given to you today.  *If you need a refill on your cardiac medications before your next appointment, please call your pharmacy*   Lab Work: Today: CBC If you have labs (blood work) drawn today and your tests are completely normal, you will receive your results only by: [place] (if you have MyChart) OR A paper copy in the mail If you have any lab test that is abnormal or we need to change your treatment, we will call you to review the results.   Testing/Procedures: None ordered   Follow-Up: At [facility], you and your health needs are our priority.  As part of our continuing mission to provide you with exceptional heart care, we have created designated Provider Care Teams.  These Care Teams include your primary Cardiologist (physician) and Advanced Practice Providers (APPs -  Physician Assistants and Nurse Practitioners) who all work together to provide you with the care you need, when you need it.  Your next appointment:   to be determined  The format for your next appointment:   In Person  Provider:   Allegra Lai, MD  You have been referred to Dr. Quentin Ore for [facility] consult    Thank you for choosing [facility]!!   Trinidad Curet, RN [phone]   Other Instructions  Cardiac Ablation Cardiac ablation is a procedure to destroy, or ablate, a small amount of heart tissue in very specific places. The heart has many electrical connections. Sometimes these connections are abnormal and can cause the heart to beat very fast or irregularly. Ablating some of the areas that cause problems can improve the heart's rhythm or return it to normal. Ablation may be done for people who: Have Wolff-Parkinson-White syndrome. Have fast heart rhythms (tachycardia). Have taken medicines for an abnormal heart rhythm  (arrhythmia) that were not effective or caused side effects. Have a high-risk heartbeat that may be life-threatening. During the procedure, a small incision is made in the neck or the groin, and a long, thin tube (catheter) is inserted into the incision and moved to the heart. Small devices (electrodes) on the tip of the catheter will send out electrical currents. A type of X-ray (fluoroscopy) will be used to help guide the catheter and to provide images of the heart. Tell a health care provider about: Any allergies you have. All medicines you are taking, including vitamins, herbs, eye drops, creams, and over-the-counter medicines. Any problems you or family members have had with anesthetic medicines. Any blood disorders you have. Any surgeries you have had. Any medical conditions you have, such as kidney failure. Whether you are pregnant or may be pregnant. What are the risks? Generally, this is a safe procedure. However, problems may occur, including: Infection. Bruising and bleeding at the catheter insertion site. Bleeding into the chest, especially into the sac that surrounds the heart. This is a serious complication. Stroke or blood clots. Damage to nearby structures or organs. Allergic reaction to medicines or dyes. Need for a permanent pacemaker if the normal electrical system is damaged. A pacemaker is a small computer that sends electrical signals to the heart and helps your heart beat normally. The procedure not being fully effective. This may not be recognized until months later. Repeat ablation procedures are sometimes done. What happens before the procedure? Medicines Ask your health care provider about: Changing or stopping your regular medicines. This is  especially important if you are taking diabetes medicines or blood thinners. Taking medicines such as aspirin and ibuprofen. These medicines can thin your blood. Do not take these medicines unless your health care provider  tells you to take them. Taking over-the-counter medicines, vitamins, herbs, and supplements. General instructions Follow instructions from your health care provider about eating or drinking restrictions. Plan to have someone take you home from the hospital or clinic. If you will be going home right after the procedure, plan to have someone with you for 24 hours. Ask your health care provider what steps will be taken to prevent infection. What happens during the procedure?  An IV will be inserted into one of your veins. You will be given a medicine to help you relax (sedative). The skin on your neck or groin will be numbed. An incision will be made in your neck or your groin. A needle will be inserted through the incision and into a large vein in your neck or groin. A catheter will be inserted into the needle and moved to your heart. Dye may be injected through the catheter to help your surgeon see the area of the heart that needs treatment. Electrical currents will be sent from the catheter to ablate heart tissue in desired areas. There are three types of energy that may be used to do this: Heat (radiofrequency energy). Laser energy. Extreme cold (cryoablation). When the tissue has been ablated, the catheter will be removed. Pressure will be held on the insertion area to prevent a lot of bleeding. A bandage (dressing) will be placed over the insertion area. The exact procedure may vary among health care providers and hospitals. What happens after the procedure? Your blood pressure, heart rate, breathing rate, and blood oxygen level will be monitored until you leave the hospital or clinic. Your insertion area will be monitored for bleeding. You will need to lie still for a few hours to ensure that you do not bleed from the insertion area. Do not drive for 24 hours or as long as told by your health care provider. Summary Cardiac ablation is a procedure to destroy, or ablate, a small amount  of heart tissue using an electrical current. This procedure can improve the heart rhythm or return it to normal. Tell your health care provider about any medical conditions you may have and all medicines you are taking to treat them. This is a safe procedure, but problems may occur. Problems may include infection, bruising, damage to nearby organs or structures, or allergic reactions to medicines. Follow your health care provider's instructions about eating and drinking before the procedure. You may also be told to change or stop some of your medicines. After the procedure, do not drive for 24 hours or as long as told by your health care provider. This information is not intended to replace advice given to you by your health care provider. Make sure you discuss any questions you have with your healthcare provider. Document Revised: 03/23/2019 Document Reviewed: 03/23/2019 Elsevier Patient Education  [place].

## 2020-11-22 NOTE — Telephone Encounter (Signed)
Left message for patient to call back  

## 2020-11-22 NOTE — Progress Notes (Signed)
Electrophysiology Office Note   Date:  11/22/2020   ID:  Jose Holden, Jose Holden 07/27/1955, MRN [id number]  PCP:  Hoyt Koch, MD  Cardiologist:   Primary Electrophysiologist:  Dewel Lotter Meredith Leeds, MD    Chief Complaint: Atrial fibrillation   History of Present Illness: Jose Holden is a 65 y.o. male who is being seen today for the evaluation of atrial fibrillation at the request of Hoyt Koch, *. Presenting today for electrophysiology evaluation.  He has a history significant for atrial fibrillation, retinal detachment.  He had a hospitalization for GI bleed.  He had an EGD, colonoscopy, capsule endoscopy which found no source of bleeding.  He was started on anticoagulation.  He continues to have small amounts of bright red blood, but no overt bleeding.  He had an attempted cardioversion and was in sinus rhythm for less than a minute when he reverted back to atrial fibrillation.  Today, denies symptoms of palpitations, chest pain, shortness of breath, orthopnea, PND, lower extremity edema, claudication, dizziness, presyncope, syncope, bleeding, or neurologic sequela. The patient is tolerating medications without difficulties.  He currently feels well.  He does not have shortness of breath or fatigue.  He is able to do all of his daily activities.  He does state that there are certain foods that cause him more GI bleeding.  Again each time this is bright red blood.   Past Medical History:  Diagnosis Date   A-fib ([place])    On Aspirin only   Anxiety    Cataract    removed left eye that caused retinal detachment x2   ED (erectile dysfunction)    Heart murmur    past hx per pt    Lactose intolerance    Retinal detachment    x2- left eye    Past Surgical History:  Procedure Laterality Date   APPENDECTOMY     CARDIOVERSION N/A 11/09/2020   Procedure: CARDIOVERSION;  Surgeon: Skeet Latch, MD;  Location: [place];  Service: Cardiovascular;  Laterality:  N/A;   COLONOSCOPY     10 yr ago [place]- normal per pt    COLONOSCOPY WITH PROPOFOL N/A 09/07/2020   Procedure: COLONOSCOPY WITH PROPOFOL;  Surgeon: Jerene Bears, MD;  Location: [facility] ENDOSCOPY;  Service: Gastroenterology;  Laterality: N/A;   ESOPHAGOGASTRODUODENOSCOPY (EGD) WITH PROPOFOL N/A 09/05/2020   Procedure: ESOPHAGOGASTRODUODENOSCOPY (EGD) WITH PROPOFOL;  Surgeon: Jerene Bears, MD;  Location: [facility] ENDOSCOPY;  Service: Gastroenterology;  Laterality: N/A;   EYE SURGERY     detached retina surgery x2   GIVENS CAPSULE STUDY N/A 09/05/2020   Procedure: GIVENS CAPSULE STUDY;  Surgeon: Jerene Bears, MD;  Location: [facility] ENDOSCOPY;  Service: Gastroenterology;  Laterality: N/A;   GIVENS CAPSULE STUDY N/A 09/10/2020   Procedure: GIVENS CAPSULE STUDY;  Surgeon: Jerene Bears, MD;  Location: [place];  Service: Gastroenterology;  Laterality: N/A;   LAPAROSCOPIC APPENDECTOMY N/A 08/29/2015   Procedure: APPENDECTOMY LAPAROSCOPIC;  Surgeon: Johnathan Hausen, MD;  Location: [facility];  Service: General;  Laterality: N/A;   UPPER GASTROINTESTINAL ENDOSCOPY       Current Outpatient Medications  Medication Sig Dispense Refill   acetaminophen (TYLENOL) 500 MG tablet Take 500-1,000 mg by mouth every 6 (six) hours as needed (headache/pain).     apixaban (ELIQUIS) 5 MG TABS tablet Take 1 tablet (5 mg total) by mouth 2 (two) times daily. 60 tablet 3   Cholecalciferol (VITAMIN D-3) 125 MCG (5000 UT) TABS Take 5,000 Units by mouth in  the morning.     ferrous sulfate ([employer] IRON) 325 (65 FE) MG tablet Take 1 tablet (325 mg total) by mouth 2 (two) times daily with a meal.  3   hydrocortisone cream 0.5 % Apply 1 application topically as needed for itching (hemorrhoids/discomfort).     hydroxypropyl methylcellulose / hypromellose (ISOPTO TEARS / GONIOVISC) 2.5 % ophthalmic solution Place 1 drop into both eyes 3 (three) times daily as needed (dry/irritated eyes).     melatonin 5 MG TABS Take 5 mg by mouth at bedtime.      metoprolol succinate (TOPROL-XL) 50 MG 24 hr tablet Take 1 tablet (50 mg total) by mouth daily. Take with or immediately following a meal. 30 tablet 3   Naphazoline-Glycerin (CLEAR EYES MAX REDNESS RELIEF) 0.03-0.5 % SOLN Place 1-2 drops into both eyes 3 (three) times daily as needed (dry/irritated eyes.).     Pramox-PE-Glycerin-Petrolatum ([employer]S HEMORRHOIDAL) 1-0.25-14.4-15 % CREA Place 1 application rectally as needed (discomfort/hemorrhoids).     propranolol (INDERAL) 20 MG tablet Take 1 tablet (20 mg total) by mouth 2 (two) times daily as needed (for performance-induced stress).     selenium 200 MCG TABS tablet Take 200 mcg by mouth in the morning.     Spirulina 500 MG TABS Take 500 mg by mouth in the morning.     zinc gluconate 50 MG tablet Take 50 mg by mouth every other day. In the morning     No current facility-administered medications for this visit.    Allergies:   Patient has no known allergies.   Social History:  The patient  reports that he has never smoked. He has never used smokeless tobacco. He reports current alcohol use. He reports that he does not use drugs.   Family History:  The patient's family history includes Brain cancer in his father; Cancer in his father and mother; Pancreatic cancer in his mother; Seizures in his father.   ROS:  Please see the history of present illness.   Otherwise, review of systems is positive for none.   All other systems are reviewed and negative.   PHYSICAL EXAM: VS:  BP 118/60   Pulse 69   Ht 6\' 7"  (2.007 m)   Wt 203 lb (92.1 kg)   SpO2 97%   BMI 22.87 kg/m  , BMI Body mass index is 22.87 kg/m. GEN: Well nourished, well developed, in no acute distress  HEENT: normal  Neck: no JVD, carotid bruits, or masses Cardiac: Irregular; no murmurs, rubs, or gallops,no edema  Respiratory:  clear to auscultation bilaterally, normal work of breathing GI: soft, nontender, nondistended, + BS MS: no deformity or atrophy  Skin: warm and dry Neuro:   Strength and sensation are intact Psych: euthymic mood, full affect  EKG:  EKG is not ordered today. Personal review of the ekg ordered 10/27/20 shows atrial fibrillation, rate 72  Recent Labs: 06/14/2020: TSH 5.41 09/04/2020: ALT 18 09/11/2020: Magnesium 2.0 10/27/2020: BUN 24; Creatinine, Ser 0.88; Hemoglobin 13.4; Platelets 266; Potassium 4.1; Sodium 139    Lipid Panel     Component Value Date/Time   CHOL 141 06/14/2020 1514   TRIG 56.0 06/14/2020 1514   HDL 39.80 06/14/2020 1514   CHOLHDL 4 06/14/2020 1514   VLDL 11.2 06/14/2020 1514   LDLCALC 90 06/14/2020 1514     Wt Readings from Last 3 Encounters:  11/22/20 203 lb (92.1 kg)  11/09/20 200 lb 13.4 oz (91.1 kg)  10/27/20 200 lb 12.8 oz (91.1 kg)  Other studies Reviewed: Additional studies/ records that were reviewed today include: TTE 2015 Left ventricular systolic function is normal.  LV ejection fraction = 55%.  The left ventricular wall motion is normal.  The right ventricle is borderline dilated.  The right ventricular systolic function is borderline reduced.  The left atrium is mildly dilated.  The right atrium is mildly dilated.  There is mild mitral regurgitation.  Injection of agitated saline showed no right-to-left shunt.  There is no pericardial effusion.    ASSESSMENT AND PLAN:  1.  Paroxysmal atrial fibrillation: Currently on propranolol as needed.  CHA2DS2-VASc of 0.  He is currently on Eliquis as we are trying to get him back into normal rhythm.  He may benefit from [facility] implant, as he has GI bleeding.  That being said, his CHA2DS2-VASc is 0 and he may not qualify.  He would like to try and get back into normal rhythm.  We discussed options including medication management versus ablation.  He would like to try and avoid medications, and Lon Klippel consider ablation.  He Tesia Lybrand call us with an answer.  2.  GI bleeding: Has a CHA2DS2-VASc of 0, though he Signora Zucco likely require anticoagulation in the future.  I  Daizha Anand refer him for possible watchman implant.  We Kayleen Alig check a CBC today.  I have seen [name] in the office today.  He was referred by Hoyt Koch, * for consideration for Left Atrial Appendage Closure with the Watchman Device for management of stroke risk. Based upon past history, it has been determined that he is a poor candidate for long-term oral anticoagulation.  However, he may be tolerant of short-term treatment with an anticoagulant as necessary.  A shared decision has been made utilizing the [employer] of Cardiology shared decision tool to undergo Left Atrial Appendage Closure with Watchman device at this time.    Current medicines are reviewed at length with the patient today.   The patient does not have concerns regarding his medicines.  The following changes were made today: none  Labs/ tests ordered today include:  Orders Placed This Encounter  Procedures   CBC      Disposition:   FU with Mikka Kissner 3 months  Signed, Benigna Delisi Meredith Leeds, MD  11/22/2020 4:26 PM     [address] [address] [phone] (office) [phone] (fax)

## 2020-11-23 LAB — CBC
Hematocrit: 48.1 % (ref 37.5–51.0)
Hemoglobin: 15.3 g/dL (ref 13.0–17.7)
MCH: 28.8 pg (ref 26.6–33.0)
MCHC: 31.8 g/dL (ref 31.5–35.7)
MCV: 90 fL (ref 79–97)
Platelets: 242 10*3/uL (ref 150–450)
RBC: 5.32 x10E6/uL (ref 4.14–5.80)
RDW: 17.1 % — ABNORMAL HIGH (ref 11.6–15.4)
WBC: 6.1 10*3/uL (ref 3.4–10.8)

## 2020-11-24 NOTE — Telephone Encounter (Signed)
Patient notified to discuss with his PCP.

## 2020-11-24 NOTE — Telephone Encounter (Signed)
Patient called states he recently did lab work and his iron levels are at a normal range now so he would to know if he should stop the iron supplements or continue at a lower dose.

## 2020-11-24 NOTE — Telephone Encounter (Signed)
Patient notified off dates of procedure and all questions answered

## 2020-11-25 ENCOUNTER — Telehealth: Payer: Self-pay | Admitting: Internal Medicine

## 2020-11-25 NOTE — Telephone Encounter (Signed)
Yes, can continue iron 3-4 times per week.

## 2020-11-25 NOTE — Telephone Encounter (Signed)
See below

## 2020-11-25 NOTE — Telephone Encounter (Signed)
   Patient calling for advice  He would like to know if he needs to continue taking Iron. He states his last lab results show iron is 15   Please call patient

## 2020-11-29 NOTE — Telephone Encounter (Signed)
Patient is following up. He would like to know if he needs to discontinue Metoprolol when he begins Flecainide. Please advise when able.

## 2020-11-29 NOTE — Telephone Encounter (Signed)
Follow up message    Patient seeking additional instructions on taking iron. Patient currently taking 1 tablet in the morning only. Patient has concerns about hemoglobin dropping. He wants to be sure he is taking the right amount of medication When should he have additional labs?  Please call patient or respond in [place]

## 2020-11-30 NOTE — Telephone Encounter (Signed)
Blood levels were back to normal as of recent labs 11/22/20 so could be monitored again in 3--6 months or sooner if he starts having blood in stool or dark stools.

## 2020-11-30 NOTE — Telephone Encounter (Signed)
See below

## 2020-11-30 NOTE — Telephone Encounter (Signed)
See my chart message

## 2020-12-01 ENCOUNTER — Encounter: Payer: Self-pay | Admitting: *Deleted

## 2020-12-01 NOTE — Telephone Encounter (Signed)
Error   This encounter was created in error - please disregard. 

## 2020-12-01 NOTE — Telephone Encounter (Signed)
Called pt, questions answered. Clarification given. Pt aware scheduler will contact him again to schedule consult w/ Dr. Quentin Ore. Pt agreeable to starting Flecainide/scheduling DCCV.  Aware I will discuss Flecainide dosing w/ Dr. Curt Bears and call him next week to start medication and schedule procedure. Patient verbalized understanding and agreeable to plan.

## 2020-12-13 ENCOUNTER — Ambulatory Visit: Payer: 59 | Admitting: Cardiology

## 2020-12-19 MED ORDER — FLECAINIDE ACETATE 100 MG PO TABS: 100.0000 mg | ORAL_TABLET | Freq: Two times a day (BID) | ORAL | 3 refills | Status: DC

## 2020-12-20 ENCOUNTER — Telehealth: Payer: Self-pay | Admitting: Cardiology

## 2020-12-20 ENCOUNTER — Ambulatory Visit (INDEPENDENT_AMBULATORY_CARE_PROVIDER_SITE_OTHER): Payer: 59 | Admitting: Gastroenterology

## 2020-12-20 ENCOUNTER — Other Ambulatory Visit: Payer: Self-pay

## 2020-12-20 ENCOUNTER — Encounter: Payer: Self-pay | Admitting: Gastroenterology

## 2020-12-20 ENCOUNTER — Other Ambulatory Visit (INDEPENDENT_AMBULATORY_CARE_PROVIDER_SITE_OTHER): Payer: 59

## 2020-12-20 VITALS — BP 90/66 | HR 73 | Ht 79.0 in | Wt 204.8 lb

## 2020-12-20 DIAGNOSIS — K648 Other hemorrhoids: Secondary | ICD-10-CM | POA: Diagnosis not present

## 2020-12-20 DIAGNOSIS — D509 Iron deficiency anemia, unspecified: Secondary | ICD-10-CM | POA: Diagnosis not present

## 2020-12-20 DIAGNOSIS — Z86010 Personal history of colonic polyps: Secondary | ICD-10-CM | POA: Diagnosis not present

## 2020-12-20 DIAGNOSIS — Z7901 Long term (current) use of anticoagulants: Secondary | ICD-10-CM

## 2020-12-20 LAB — CBC WITH DIFFERENTIAL/PLATELET
Basophils Absolute: 0 10*3/uL (ref 0.0–0.1)
Basophils Relative: 0.9 % (ref 0.0–3.0)
Eosinophils Absolute: 0.4 10*3/uL (ref 0.0–0.7)
Eosinophils Relative: 7.2 % — ABNORMAL HIGH (ref 0.0–5.0)
HCT: 45.5 % (ref 39.0–52.0)
Hemoglobin: 15 g/dL (ref 13.0–17.0)
Lymphocytes Relative: 29.5 % (ref 12.0–46.0)
Lymphs Abs: 1.4 10*3/uL (ref 0.7–4.0)
MCHC: 33.1 g/dL (ref 30.0–36.0)
MCV: 88.2 fl (ref 78.0–100.0)
Monocytes Absolute: 0.5 10*3/uL (ref 0.1–1.0)
Monocytes Relative: 11 % (ref 3.0–12.0)
Neutro Abs: 2.5 10*3/uL (ref 1.4–7.7)
Neutrophils Relative %: 51.4 % (ref 43.0–77.0)
Platelets: 197 10*3/uL (ref 150.0–400.0)
RBC: 5.16 Mil/uL (ref 4.22–5.81)
RDW: 18.3 % — ABNORMAL HIGH (ref 11.5–15.5)
WBC: 4.9 10*3/uL (ref 4.0–10.5)

## 2020-12-20 LAB — IBC + FERRITIN
Ferritin: 22.7 ng/mL (ref 22.0–322.0)
Iron: 56 ug/dL (ref 42–165)
Saturation Ratios: 15.9 % — ABNORMAL LOW (ref 20.0–50.0)
Transferrin: 251 mg/dL (ref 212.0–360.0)

## 2020-12-20 LAB — HIV ANTIBODY (ROUTINE TESTING W REFLEX): HIV 1&2 Ab, 4th Generation: NONREACTIVE

## 2020-12-20 NOTE — Telephone Encounter (Signed)
Patient has a cardioversion and an appointment with Dr. Quentin Ore 8/5. He would like to know if this will be a scheduling conflict.

## 2020-12-20 NOTE — Patient Instructions (Signed)
Your provider has requested that you go to the basement level for lab work before leaving today. Press "B" on the elevator. The lab is located at the first door on the left as you exit the elevator.  Follow up with your primary care physician.   Due to recent changes in healthcare laws, you may see the results of your imaging and laboratory studies on MyChart before your provider has had a chance to review them.  We understand that in some cases there may be results that are confusing or concerning to you. Not all laboratory results come back in the same time frame and the provider may be waiting for multiple results in order to interpret others.  Please give [place] 48 hours in order for your provider to thoroughly review all the results before contacting the office for clarification of your results.   The [place] [facility] providers would like to encourage you to use [facility] to communicate with providers for non-urgent requests or questions.  Due to long hold times on the telephone, sending your provider a message by [facility] may be a faster and more efficient way to get a response.  Please allow 48 business hours for a response.  Please remember that this is for non-urgent requests.   Normal BMI (Body Mass Index- based on height and weight) is between 19 and 25. Your BMI today is Body mass index is 23.07 kg/m. Marland Kitchen Please consider follow up  regarding your BMI with your Primary Care Provider.  Thank you for choosing me and [facility].  Pricilla Riffle. Dagoberto Ligas., MD., Marval Regal

## 2020-12-20 NOTE — Progress Notes (Signed)
    History of Present Illness: This is a 65 year old male with a history of IDA and hemorrhoidal bleeding.  See Oct 17, 2000 office note for further details on his prior evaluation.  He states he has been fairly minimal intermittent trace bleeding from his hemorrhoids.  No other gastrointestinal complaints.  His hemoglobin was 15.3 in late June.  Current Medications, Allergies, Past Medical History, Past Surgical History, Family History and Social History were reviewed in [employer] record.   Physical Exam: General: Well developed, well nourished, no acute distress Head: Normocephalic and atraumatic Eyes: Sclerae anicteric, EOMI Ears: Normal auditory acuity Mouth: Not examined, mask on during Covid-19 pandemic Lungs: Clear throughout to auscultation Heart: Regular rate and rhythm; no murmurs, rubs or bruits Abdomen: Soft, non tender and non distended. No masses, hepatosplenomegaly or hernias noted. Normal Bowel sounds Rectal: Not done Musculoskeletal: Symmetrical with no gross deformities  Pulses:  Normal pulses noted Extremities: No clubbing, cyanosis, edema or deformities noted Neurological: Alert oriented x 4, grossly nonfocal Psychological:  Alert and cooperative. Normal mood and affect   Assessment and Recommendations:  IDA. Hgb has increased to 15.3 as of late June.  He continues on FeSO4 325 mg PO 3 times per week.  Check CBC, iron, TIBC, ferritin today.  He requests an HIV test as well.  Return to PCP for ongoing management.  GI follow up as needed.  Trace, infrequent rectal bleeding due to internal hemorrhoids.  Continue topical hemorrhoidal cream twice daily as needed for hemorrhoid symptoms.  Return to PCP for ongoing management.  GI follow up as needed. Personal history of adenomatous colon polyps and sessile serrated colon polyps.  A 3-year interval surveillance colonoscopy is recommended in August 2024. A. fib maintained on Eliquis.

## 2020-12-27 ENCOUNTER — Other Ambulatory Visit: Payer: Self-pay

## 2020-12-27 ENCOUNTER — Telehealth (INDEPENDENT_AMBULATORY_CARE_PROVIDER_SITE_OTHER): Payer: 59 | Admitting: Cardiology

## 2020-12-27 ENCOUNTER — Ambulatory Visit: Payer: 59 | Admitting: Gastroenterology

## 2020-12-27 ENCOUNTER — Encounter: Payer: Self-pay | Admitting: Cardiology

## 2020-12-27 DIAGNOSIS — I4819 Other persistent atrial fibrillation: Secondary | ICD-10-CM | POA: Diagnosis not present

## 2020-12-27 NOTE — Telephone Encounter (Signed)
Pt aware that he will need to have someone with him for procedure Friday, that he cannot leave hospital alone. Pt will speak to a friend about helping. He will let us know if there are any issues.

## 2020-12-27 NOTE — H&P (View-Only) (Signed)
Electrophysiology TeleHealth Note   Due to national recommendations of social distancing due to COVID 19, an audio/video telehealth visit is felt to be most appropriate for this patient at this time.  See Epic message for the patient's consent to telehealth for [facility].   Date:  12/27/2020   ID:  Jose Holden, DOB 1955-10-05, MRN [id number]  Location: patient's home  Provider location: [address] [place]  Evaluation Performed: Follow-up visit  PCP:  Hoyt Koch, MD  Cardiologist:  None  Electrophysiologist:  Dr Curt Bears  Chief Complaint:  AF  History of Present Illness:    Jose Holden is a 65 y.o. male who presents via audio/video conferencing for a telehealth visit today.  Since last being seen in our clinic, the patient reports doing very well.  Today, he denies symptoms of palpitations, chest pain, shortness of breath,  lower extremity edema, dizziness, presyncope, or syncope.  The patient is otherwise without complaint today.  The patient denies symptoms of fevers, chills, cough, or new SOB worrisome for COVID 19.  He has a history of atrial fibrillation, GI bleeding, retinal detachment.  He has been having episodes of atrial fibrillation for quite a while with weakness and fatigue.  He has plan for cardioversion.  Today, denies symptoms of palpitations, chest pain, shortness of breath, orthopnea, PND, lower extremity edema, claudication, dizziness, presyncope, syncope, bleeding, or neurologic sequela. The patient is tolerating medications without difficulties.    Past Medical History:  Diagnosis Date   A-fib ([place])    On Aspirin only   Anxiety    Cataract    removed left eye that caused retinal detachment x2   ED (erectile dysfunction)    Heart murmur    past hx per pt    Lactose intolerance    Retinal detachment    x2- left eye     Past Surgical History:  Procedure Laterality Date   APPENDECTOMY     CARDIOVERSION N/A  11/09/2020   Procedure: CARDIOVERSION;  Surgeon: Skeet Latch, MD;  Location: [place];  Service: Cardiovascular;  Laterality: N/A;   COLONOSCOPY     10 yr ago [place]- normal per pt    COLONOSCOPY WITH PROPOFOL N/A 09/07/2020   Procedure: COLONOSCOPY WITH PROPOFOL;  Surgeon: Jerene Bears, MD;  Location: [facility] ENDOSCOPY;  Service: Gastroenterology;  Laterality: N/A;   ESOPHAGOGASTRODUODENOSCOPY (EGD) WITH PROPOFOL N/A 09/05/2020   Procedure: ESOPHAGOGASTRODUODENOSCOPY (EGD) WITH PROPOFOL;  Surgeon: Jerene Bears, MD;  Location: [facility] ENDOSCOPY;  Service: Gastroenterology;  Laterality: N/A;   EYE SURGERY     detached retina surgery x2   GIVENS CAPSULE STUDY N/A 09/05/2020   Procedure: GIVENS CAPSULE STUDY;  Surgeon: Jerene Bears, MD;  Location: [facility] ENDOSCOPY;  Service: Gastroenterology;  Laterality: N/A;   GIVENS CAPSULE STUDY N/A 09/10/2020   Procedure: GIVENS CAPSULE STUDY;  Surgeon: Jerene Bears, MD;  Location: [place];  Service: Gastroenterology;  Laterality: N/A;   LAPAROSCOPIC APPENDECTOMY N/A 08/29/2015   Procedure: APPENDECTOMY LAPAROSCOPIC;  Surgeon: Johnathan Hausen, MD;  [facility];  Service: General;  Laterality: N/A;   UPPER GASTROINTESTINAL ENDOSCOPY      Current Outpatient Medications  Medication Sig Dispense Refill   acetaminophen (TYLENOL) 500 MG tablet Take 500-1,000 mg by mouth every 6 (six) hours as needed (headache/pain).     apixaban (ELIQUIS) 5 MG TABS tablet Take 1 tablet (5 mg total) by mouth 2 (two) times daily. 60 tablet 3   Cholecalciferol (  VITAMIN D-3) 125 MCG (5000 UT) TABS Take 5,000 Units by mouth in the morning.     ferrous sulfate (CVS IRON) 325 (65 FE) MG tablet Take 1 tablet (325 mg total) by mouth 2 (two) times daily with a meal.  3   flecainide (TAMBOCOR) 100 MG tablet Take 1 tablet (100 mg total) by mouth 2 (two) times daily. 60 tablet 3   hydrocortisone cream 0.5 % Apply 1 application topically as needed for itching (hemorrhoids/discomfort).      hydroxypropyl methylcellulose / hypromellose (ISOPTO TEARS / GONIOVISC) 2.5 % ophthalmic solution Place 1 drop into both eyes 3 (three) times daily as needed (dry/irritated eyes).     melatonin 5 MG TABS Take 5 mg by mouth at bedtime.     metoprolol succinate (TOPROL-XL) 50 MG 24 hr tablet Take 1 tablet (50 mg total) by mouth daily. Take with or immediately following a meal. 30 tablet 3   Naphazoline-Glycerin (CLEAR EYES MAX REDNESS RELIEF) 0.03-0.5 % SOLN Place 1-2 drops into both eyes 3 (three) times daily as needed (dry/irritated eyes.).     Pramox-PE-Glycerin-Petrolatum (CVS HEMORRHOIDAL) 1-0.25-14.4-15 % CREA Place 1 application rectally as needed (discomfort/hemorrhoids).     propranolol (INDERAL) 20 MG tablet Take 1 tablet (20 mg total) by mouth 2 (two) times daily as needed (for performance-induced stress).     selenium 200 MCG TABS tablet Take 200 mcg by mouth in the morning.     Spirulina 500 MG TABS Take 500 mg by mouth in the morning.     zinc gluconate 50 MG tablet Take 50 mg by mouth every other day. In the morning     No current facility-administered medications for this visit.    Allergies:   Patient has no allergy information on record.   Social History:  The patient  reports that he has never smoked. He has never used smokeless tobacco. He reports current alcohol use. He reports that he does not use drugs.   Family History:  The patient's  family history includes Brain cancer in his father; Cancer in his father and mother; Pancreatic cancer in his mother; Seizures in his father.   ROS:  Please see the history of present illness.   All other systems are personally reviewed and negative.    Exam:    Vital Signs:  There were no vitals taken for this visit.  Well appearing, alert and conversant, regular work of breathing,  good skin color Eyes- anicteric, neuro- grossly intact, skin- no apparent rash or lesions or cyanosis, mouth- oral mucosa is pink  Labs/Other Tests and  Data Reviewed:    Recent Labs: 06/14/2020: TSH 5.41 09/04/2020: ALT 18 09/11/2020: Magnesium 2.0 10/27/2020: BUN 24; Creatinine, Ser 0.88; Potassium 4.1; Sodium 139 12/20/2020: Hemoglobin 15.0; Platelets 197.0   Wt Readings from Last 3 Encounters:  12/20/20 204 lb 12.8 oz (92.9 kg)  11/22/20 203 lb (92.1 kg)  11/09/20 200 lb 13.4 oz (91.1 kg)     Other studies personally reviewed: Additional studies/ records that were reviewed today include: ECG 10/27/20  Review of the above records today demonstrates: Atrial fibrillation   ASSESSMENT & PLAN:    1.  Persistent atrial fibrillation: Currently on flecainide, metoprolol, Eliquis.  He remains in atrial fibrillation.  He would thus benefit from cardioversion.  We have discussed the procedure thoroughly.  He Jeromy [place] likely be able to stop his anticoagulation after 24monthpost cardioversion.   COVID 19 screen The patient denies symptoms of COVID 19 at this time.  The importance of social distancing was discussed today.  Follow-up: 3 months  Current medicines are reviewed at length with the patient today.   The patient does not have concerns regarding his medicines.  The following changes were made today:  none  Labs/ tests ordered today include:  No orders of the defined types were placed in this encounter.    Patient Risk:  after full review of this patients clinical status, I feel that they are at moderate risk at this time.     Signed, Madoc Holquin Meredith Leeds, MD  12/27/2020 5:10 PM     [place] [address] [phone] (office) [phone] (fax)

## 2020-12-27 NOTE — Progress Notes (Signed)
Electrophysiology TeleHealth Note   Due to national recommendations of social distancing due to COVID 19, an audio/video telehealth visit is felt to be most appropriate for this patient at this time.  See Epic message for the patient's consent to telehealth for [facility].   Date:  12/27/2020   ID:  Jose Holden, DOB [birth year], MRN [id number]  Location: patient's home  Provider location: [address] [place]  Evaluation Performed: Follow-up visit  PCP:  Hoyt Koch, MD  Cardiologist:  None  Electrophysiologist:  Dr Curt Bears  Chief Complaint:  AF  History of Present Illness:    Jose Holden is a 65 y.o. male who presents via audio/video conferencing for a telehealth visit today.  Since last being seen in our clinic, the patient reports doing very well.  Today, he denies symptoms of palpitations, chest pain, shortness of breath,  lower extremity edema, dizziness, presyncope, or syncope.  The patient is otherwise without complaint today.  The patient denies symptoms of fevers, chills, cough, or new SOB worrisome for COVID 19.  He has a history of atrial fibrillation, GI bleeding, retinal detachment.  He has been having episodes of atrial fibrillation for quite a while with weakness and fatigue.  He has plan for cardioversion.  Today, denies symptoms of palpitations, chest pain, shortness of breath, orthopnea, PND, lower extremity edema, claudication, dizziness, presyncope, syncope, bleeding, or neurologic sequela. The patient is tolerating medications without difficulties.    Past Medical History:  Diagnosis Date   A-fib ([place])    On Aspirin only   Anxiety    Cataract    removed left eye that caused retinal detachment x2   ED (erectile dysfunction)    Heart murmur    past hx per pt    Lactose intolerance    Retinal detachment    x2- left eye     Past Surgical History:  Procedure Laterality Date   APPENDECTOMY     CARDIOVERSION N/A  11/09/2020   Procedure: CARDIOVERSION;  Surgeon: Skeet Latch, MD;  Location: [place];  Service: Cardiovascular;  Laterality: N/A;   COLONOSCOPY     10 yr ago [place]- normal per pt    COLONOSCOPY WITH PROPOFOL N/A 09/07/2020   Procedure: COLONOSCOPY WITH PROPOFOL;  Surgeon: Jerene Bears, MD;  Location: [facility] ENDOSCOPY;  Service: Gastroenterology;  Laterality: N/A;   ESOPHAGOGASTRODUODENOSCOPY (EGD) WITH PROPOFOL N/A 09/05/2020   Procedure: ESOPHAGOGASTRODUODENOSCOPY (EGD) WITH PROPOFOL;  Surgeon: Jerene Bears, MD;  [facility] ENDOSCOPY;  Service: Gastroenterology;  Laterality: N/A;   EYE SURGERY     detached retina surgery x2   GIVENS CAPSULE STUDY N/A 09/05/2020   Procedure: GIVENS CAPSULE STUDY;  Surgeon: Jerene Bears, MD;  Location: [facility] ENDOSCOPY;  Service: Gastroenterology;  Laterality: N/A;   GIVENS CAPSULE STUDY N/A 09/10/2020   Procedure: GIVENS CAPSULE STUDY;  Surgeon: Jerene Bears, MD;  Location: [place];  Service: Gastroenterology;  Laterality: N/A;   LAPAROSCOPIC APPENDECTOMY N/A 08/29/2015   Procedure: APPENDECTOMY LAPAROSCOPIC;  Surgeon: Johnathan Hausen, MD;  [facility];  Service: General;  Laterality: N/A;   UPPER GASTROINTESTINAL ENDOSCOPY      Current Outpatient Medications  Medication Sig Dispense Refill   acetaminophen (TYLENOL) 500 MG tablet Take 500-1,000 mg by mouth every 6 (six) hours as needed (headache/pain).     apixaban (ELIQUIS) 5 MG TABS tablet Take 1 tablet (5 mg total) by mouth 2 (two) times daily. 60 tablet 3   Cholecalciferol (  VITAMIN D-3) 125 MCG (5000 UT) TABS Take 5,000 Units by mouth in the morning.     ferrous sulfate (CVS IRON) 325 (65 FE) MG tablet Take 1 tablet (325 mg total) by mouth 2 (two) times daily with a meal.  3   flecainide (TAMBOCOR) 100 MG tablet Take 1 tablet (100 mg total) by mouth 2 (two) times daily. 60 tablet 3   hydrocortisone cream 0.5 % Apply 1 application topically as needed for itching (hemorrhoids/discomfort).      hydroxypropyl methylcellulose / hypromellose (ISOPTO TEARS / GONIOVISC) 2.5 % ophthalmic solution Place 1 drop into both eyes 3 (three) times daily as needed (dry/irritated eyes).     melatonin 5 MG TABS Take 5 mg by mouth at bedtime.     metoprolol succinate (TOPROL-XL) 50 MG 24 hr tablet Take 1 tablet (50 mg total) by mouth daily. Take with or immediately following a meal. 30 tablet 3   Naphazoline-Glycerin (CLEAR EYES MAX REDNESS RELIEF) 0.03-0.5 % SOLN Place 1-2 drops into both eyes 3 (three) times daily as needed (dry/irritated eyes.).     Pramox-PE-Glycerin-Petrolatum (CVS HEMORRHOIDAL) 1-0.25-14.4-15 % CREA Place 1 application rectally as needed (discomfort/hemorrhoids).     propranolol (INDERAL) 20 MG tablet Take 1 tablet (20 mg total) by mouth 2 (two) times daily as needed (for performance-induced stress).     selenium 200 MCG TABS tablet Take 200 mcg by mouth in the morning.     Spirulina 500 MG TABS Take 500 mg by mouth in the morning.     zinc gluconate 50 MG tablet Take 50 mg by mouth every other day. In the morning     No current facility-administered medications for this visit.    Allergies:   Patient has no allergy information on record.   Social History:  The patient  reports that he has never smoked. He has never used smokeless tobacco. He reports current alcohol use. He reports that he does not use drugs.   Family History:  The patient's  family history includes Brain cancer in his father; Cancer in his father and mother; Pancreatic cancer in his mother; Seizures in his father.   ROS:  Please see the history of present illness.   All other systems are personally reviewed and negative.    Exam:    Vital Signs:  There were no vitals taken for this visit.  Well appearing, alert and conversant, regular work of breathing,  good skin color Eyes- anicteric, neuro- grossly intact, skin- no apparent rash or lesions or cyanosis, mouth- oral mucosa is pink  Labs/Other Tests and  Data Reviewed:    Recent Labs: 06/14/2020: TSH 5.41 09/04/2020: ALT 18 09/11/2020: Magnesium 2.0 10/27/2020: BUN 24; Creatinine, Ser 0.88; Potassium 4.1; Sodium 139 12/20/2020: Hemoglobin 15.0; Platelets 197.0   Wt Readings from Last 3 Encounters:  12/20/20 204 lb 12.8 oz (92.9 kg)  11/22/20 203 lb (92.1 kg)  11/09/20 200 lb 13.4 oz (91.1 kg)     Other studies personally reviewed: Additional studies/ records that were reviewed today include: ECG 10/27/20  Review of the above records today demonstrates: Atrial fibrillation   ASSESSMENT & PLAN:    1.  Persistent atrial fibrillation: Currently on flecainide, metoprolol, Eliquis.  He remains in atrial fibrillation.  He would thus benefit from cardioversion.  We have discussed the procedure thoroughly.  He [place] likely be able to stop his anticoagulation after 62monthpost cardioversion.   COVID 19 screen The patient denies symptoms of COVID 19 at this time.  The importance of social distancing was discussed today.  Follow-up: 3 months  Current medicines are reviewed at length with the patient today.   The patient does not have concerns regarding his medicines.  The following changes were made today:  none  Labs/ tests ordered today include:  No orders of the defined types were placed in this encounter.    Patient Risk:  after full review of this patients clinical status, I feel that they are at moderate risk at this time.     Signed, Tyarra Nolton Meredith Leeds, MD  12/27/2020 5:10 PM     [place] [address] [phone] (office) [phone] (fax)

## 2020-12-29 ENCOUNTER — Ambulatory Visit (HOSPITAL_COMMUNITY): Admit: 2020-12-29 | Payer: 59 | Admitting: Cardiology

## 2020-12-29 ENCOUNTER — Other Ambulatory Visit: Payer: Self-pay

## 2020-12-29 ENCOUNTER — Telehealth: Payer: Self-pay

## 2020-12-29 ENCOUNTER — Encounter (HOSPITAL_COMMUNITY): Payer: Self-pay

## 2020-12-29 ENCOUNTER — Other Ambulatory Visit: Payer: Self-pay | Admitting: Cardiology

## 2020-12-29 DIAGNOSIS — I4819 Other persistent atrial fibrillation: Secondary | ICD-10-CM

## 2020-12-29 SURGERY — CARDIOVERSION
Anesthesia: Monitor Anesthesia Care

## 2020-12-29 NOTE — Anesthesia Preprocedure Evaluation (Addendum)
Anesthesia Evaluation  Patient identified by MRN, date of birth, ID band Patient awake    Reviewed: Allergy & Precautions, NPO status , Patient's Chart, lab work & pertinent test results  Airway Mallampati: II  TM Distance: >3 FB Neck ROM: Full    Dental no notable dental hx. (+) Teeth Intact, Dental Advisory Given   Pulmonary neg pulmonary ROS,    Pulmonary exam normal breath sounds clear to auscultation       Cardiovascular Normal cardiovascular exam+ dysrhythmias Atrial Fibrillation  Rhythm:Regular Rate:Normal  EF 50-55%   Neuro/Psych negative neurological ROS     GI/Hepatic Neg liver ROS,   Endo/Other  negative endocrine ROS  Renal/GU negative Renal ROS     Musculoskeletal   Abdominal   Peds  Hematology   Anesthesia Other Findings   Reproductive/Obstetrics                            Anesthesia Physical Anesthesia Plan  ASA: 3  Anesthesia Plan: General   Post-op Pain Management:    Induction: Intravenous  PONV Risk Score and Plan: Treatment may vary due to age or medical condition  Airway Management Planned: Natural Airway and Nasal Cannula  Additional Equipment: None  Intra-op Plan:   Post-operative Plan:   Informed Consent: I have reviewed the patients History and Physical, chart, labs and discussed the procedure including the risks, benefits and alternatives for the proposed anesthesia with the patient or authorized representative who has indicated his/her understanding and acceptance.     Dental advisory given  Plan Discussed with: CRNA  Anesthesia Plan Comments:        Anesthesia Quick Evaluation

## 2020-12-30 ENCOUNTER — Ambulatory Visit (HOSPITAL_COMMUNITY): Payer: 59 | Admitting: Anesthesiology

## 2020-12-30 ENCOUNTER — Encounter (HOSPITAL_COMMUNITY)
Admission: RE | Disposition: A | Discharge status: Home / Self Care | Payer: Self-pay | Source: Home / Self Care | Attending: Cardiology

## 2020-12-30 ENCOUNTER — Ambulatory Visit: Payer: 59 | Admitting: Cardiology

## 2020-12-30 ENCOUNTER — Ambulatory Visit (HOSPITAL_COMMUNITY)
Admission: RE | Admit: 2020-12-30 | Discharge: 2020-12-30 | Disposition: A | Discharge status: Home / Self Care | Payer: 59 | Attending: Cardiology | Admitting: Cardiology

## 2020-12-30 ENCOUNTER — Encounter (HOSPITAL_COMMUNITY): Payer: Self-pay | Admitting: Cardiology

## 2020-12-30 DIAGNOSIS — Z79899 Other long term (current) drug therapy: Secondary | ICD-10-CM | POA: Insufficient documentation

## 2020-12-30 DIAGNOSIS — I4819 Other persistent atrial fibrillation: Secondary | ICD-10-CM | POA: Insufficient documentation

## 2020-12-30 DIAGNOSIS — I491 Atrial premature depolarization: Secondary | ICD-10-CM | POA: Insufficient documentation

## 2020-12-30 DIAGNOSIS — Z7901 Long term (current) use of anticoagulants: Secondary | ICD-10-CM | POA: Insufficient documentation

## 2020-12-30 HISTORY — PX: CARDIOVERSION: SHX1299

## 2020-12-30 SURGERY — CARDIOVERSION
Anesthesia: General

## 2020-12-30 MED ORDER — SODIUM CHLORIDE 0.9 % IV SOLN: INTRAVENOUS | Status: DC

## 2020-12-30 MED ORDER — PROPOFOL 10 MG/ML IV BOLUS
INTRAVENOUS | Status: DC | PRN
  Administered 2020-12-30: 70 mg via INTRAVENOUS

## 2020-12-30 MED ORDER — METOPROLOL SUCCINATE ER 25 MG PO TB24
25.0000 mg | ORAL_TABLET | Freq: Every day | ORAL | 2 refills | Status: DC
Start: 2020-12-30 — End: 2021-06-22

## 2020-12-30 MED ORDER — EPHEDRINE SULFATE-NACL 50-0.9 MG/10ML-% IV SOSY
PREFILLED_SYRINGE | INTRAVENOUS | Status: DC | PRN
  Administered 2020-12-30: 5 mg via INTRAVENOUS

## 2020-12-30 MED ORDER — LIDOCAINE 2% (20 MG/ML) 5 ML SYRINGE
INTRAMUSCULAR | Status: DC | PRN
Start: 2020-12-30 — End: 2020-12-30
  Administered 2020-12-30: 60 mg via INTRAVENOUS

## 2020-12-30 NOTE — Telephone Encounter (Signed)
   Jose Holden DOB: December 16, 1955 MRN: [id number]   RIDER WAIVER AND RELEASE OF LIABILITY  For purposes of improving physical access to our facilities, [facility] is pleased to partner with third parties to provide [facility] patients or other authorized individuals the option of convenient, on-demand ground transportation services (the Technical brewer") through use of the technology service that enables users to request on-demand ground transportation from independent third-party providers.  By opting to use and accept these [employer], I, the undersigned, hereby agree on behalf of myself, and on behalf of any minor child using the Government social research officer for whom I am the parent or legal guardian, as follows:  Government social research officer provided to me are provided by independent third-party transportation providers who are not [employer] or employees and who are unaffiliated with [employer]. [facility] is neither a transportation carrier nor a common or public carrier. [facility] has no control over the quality or safety of the transportation that occurs as a result of the [employer]. [place] cannot guarantee that any third-party transportation provider will complete any arranged transportation service. [facility] makes no representation, warranty, or guarantee regarding the reliability, timeliness, quality, safety, suitability, or availability of any of the Transport Services or that they will be error free. I fully understand that traveling by vehicle involves risks and dangers of serious bodily injury, including permanent disability, paralysis, and death. I agree, on behalf of myself and on behalf of any minor child using the Transport Services for whom I am the parent or legal guardian, that the entire risk arising out of my use of the [employer] remains solely with me, to the maximum extent permitted under applicable law. The [employer] are provided "as  is" and "as available." [facility] disclaims all representations and warranties, express, implied or statutory, not expressly set out in these terms, including the implied warranties of merchantability and fitness for a particular purpose. I hereby waive and release [facility], its agents, employees, officers, directors, representatives, insurers, attorneys, assigns, successors, subsidiaries, and affiliates from any and all past, present, or future claims, demands, liabilities, actions, causes of action, or suits of any kind directly or indirectly arising from acceptance and use of the [employer]. I further waive and release [facility] and its affiliates from all present and future liability and responsibility for any injury or death to persons or damages to property caused by or related to the use of the [employer]. I have read this Waiver and Release of Liability, and I understand the terms used in it and their legal significance. This Waiver is freely and voluntarily given with the understanding that my right (as well as the right of any minor child for whom I am the parent or legal guardian using the [employer]) to legal recourse against  in connection with the [employer] is knowingly surrendered in return for use of these services.   I attest that I read the consent document to [name], gave Mr. Fang the opportunity to ask questions and answered the questions asked (if any). I affirm that [name] then provided consent for he's participation in this program.     Jose Holden

## 2020-12-30 NOTE — Anesthesia Procedure Notes (Signed)
Procedure Name: General with mask airway Date/Time: 12/30/2020 9:08 AM Performed by: Griffin Dakin, CRNA Pre-anesthesia Checklist: Patient identified, Emergency Drugs available, Suction available, Patient being monitored and Timeout performed Patient Re-evaluated:Patient Re-evaluated prior to induction Dental Injury: Teeth and Oropharynx as per pre-operative assessment

## 2020-12-30 NOTE — Discharge Instructions (Addendum)
Decrease metoprolol dose to 25 mg daily.  Do not take on 8/5.  Prior to taking on 8/6, check your blood pressure and heart rate.  If systolic blood pressure(top number) less than 90 or heart rate less than 50 do not take metoprolol and call your cardiologist    Electrical Cardioversion  Electrical cardioversion is the delivery of a jolt of electricity to restore a normal rhythm to the heart. A rhythm that is too fast or is not regular keeps the heart from pumping well. In this procedure, sticky patches or metal paddles are placed on the chest to deliver electricity to the heart from a device.  What can I expect after the procedure? Your blood pressure, heart rate, breathing rate, and blood oxygen level will be monitored until you leave the hospital or clinic. Your heart rhythm will be watched to make sure it does not change. You may have some redness on the skin where the shocks were given.If this occurs, can use hydrocortisone cream or Aloe vera.  Follow these instructions at home: Do not drive for 24 hours if you were given a sedative during your procedure. Take over-the-counter and prescription medicines only as told by your health care provider. Ask your health care provider how to check your pulse. Check it often. Rest for 48 hours after the procedure or as told by your health care provider. Avoid or limit your caffeine use as told by your health care provider. Keep all follow-up visits as told by your health care provider. This is important.  Contact a health care provider if: You feel like your heart is beating too quickly or your pulse is not regular. You have a serious muscle cramp that does not go away.  Get help right away if: You have discomfort in your chest. You are dizzy or you feel faint. You have trouble breathing or you are short of breath. Your speech is slurred. You have trouble moving an arm or leg on one side of your body. Your fingers or toes turn cold or  blue.  Summary Electrical cardioversion is the delivery of a jolt of electricity to restore a normal rhythm to the heart. This procedure may be done right away in an emergency or may be a scheduled procedure if the condition is not an emergency. Generally, this is a safe procedure. After the procedure, check your pulse often as told by your health care provider.  This information is not intended to replace advice given to you by your health care provider. Make sure you discuss any questions you have with your health care provider. Document Revised: 12/15/2018 Document Reviewed: 12/15/2018 Elsevier Patient Education  [place].

## 2020-12-30 NOTE — Interval H&P Note (Signed)
History and Physical Interval Note:  12/30/2020 9:01 AM  Jose Holden  has presented today for surgery, with the diagnosis of AFIB.  The various methods of treatment have been discussed with the patient and family. After consideration of risks, benefits and other options for treatment, the patient has consented to  Procedure(s): CARDIOVERSION (N/A) as a surgical intervention.  The patient's history has been reviewed, patient examined, no change in status, stable for surgery.  I have reviewed the patient's chart and labs.  Questions were answered to the patient's satisfaction.     [name]

## 2020-12-30 NOTE — CV Procedure (Addendum)
Procedure:   DCCV  Indication:  Symptomatic atrial fibrillation  Procedure Note:  The patient signed informed consent.  They have had had therapeutic anticoagulation with Eliquis greater than 3 weeks.  Anesthesia was administered by Dr. Valma Cava and Viann Fish, CRNA.  Adequate airway was maintained throughout and vital followed per protocol.  They were cardioverted x 1 with 200J of biphasic synchronized energy.  They converted to sinus bradycardia.  HR initially in high 30s to low 40s after cardioversion and BP 71/48.  Given ephedrine '5mg'$  with improvement in BP to 90/60s.  HR settled in mid 40s.  He appeared asymptomatic.  Recommend decreasing metoprolol from 50 to 25 mg daily.  Advised to check BP/HR prior to taking metoprol dose tomorrow and call if SBP<90 or HR<50.  The patient had normal neuro status and respiratory status post procedure with vitals stable as recorded elsewhere.    Follow up:  They will continue on current medical therapy and follow up with cardiology as scheduled.  Oswaldo Milian, MD 12/30/2020 9:28 AM

## 2020-12-30 NOTE — Anesthesia Postprocedure Evaluation (Signed)
Anesthesia Post Note  Patient: Jose Holden  Procedure(s) Performed: CARDIOVERSION     Patient location during evaluation: Endoscopy Anesthesia Type: General Level of consciousness: awake and alert Pain management: pain level controlled Vital Signs Assessment: post-procedure vital signs reviewed and stable Respiratory status: spontaneous breathing, nonlabored ventilation, respiratory function stable and patient connected to nasal cannula oxygen Cardiovascular status: blood pressure returned to baseline and stable Postop Assessment: no apparent nausea or vomiting Anesthetic complications: no   No notable events documented.  Last Vitals:  Vitals:   12/30/20 0926 12/30/20 0936  BP: (!) 140/50 (!) 82/51  Pulse: (!) 44 (!) 41  Resp: 15 12  Temp:    SpO2: 96% 97%    Last Pain:  Vitals:   12/30/20 0936  TempSrc:   PainSc: 0-No pain                 Barnet Glasgow

## 2020-12-30 NOTE — Transfer of Care (Signed)
Immediate Anesthesia Transfer of Care Note  Patient: Jose Holden  Procedure(s) Performed: CARDIOVERSION  Patient Location: PACU and Endoscopy Unit  Anesthesia Type:General  Level of Consciousness: awake, alert  and oriented  Airway & Oxygen Therapy: Patient Spontanous Breathing  Post-op Assessment: Report given to RN and Post -op Vital signs reviewed and stable  Post vital signs: Reviewed and stable  Last Vitals:  Vitals Value Taken Time  BP    Temp    Pulse 48 12/30/20 0915  Resp 19 12/30/20 0915  SpO2 93 % 12/30/20 0915    Last Pain:  Vitals:   12/30/20 0845  TempSrc: Temporal  PainSc: 0-No pain         Complications: No notable events documented.

## 2021-01-02 ENCOUNTER — Telehealth: Payer: Self-pay | Admitting: Cardiology

## 2021-01-02 ENCOUNTER — Telehealth: Payer: Self-pay | Admitting: Internal Medicine

## 2021-01-02 NOTE — Telephone Encounter (Signed)
Patient called to make an appointment stating he just wants to discuss having a covid vaccine and monkey pox vaccine. Just discussing if he is able to get those with his health and everything for his job.   Not sure if the appointment should be cancelled and just give the pt a phone call.Please advise

## 2021-01-02 NOTE — Telephone Encounter (Signed)
Lm to call back ./cy 

## 2021-01-02 NOTE — Telephone Encounter (Signed)
STAT if HR is under 50 or over 120 (normal HR is 60-100 beats per minute)  What is your heart rate? Not sure. Patient is at work  Do you have a log of your heart rate readings (document readings)? 50, 53, 39  Do you have any other symptoms? no   The patient feels like there is some inconsistent gaps between the beats of his heart. He feels like his heart is out of rhythm like it was prior to his last cardioversion.   The patient also states that cell service at his work is not great. If he does not pick up he will call back

## 2021-01-03 ENCOUNTER — Encounter (HOSPITAL_COMMUNITY): Payer: Self-pay | Admitting: Cardiology

## 2021-01-03 NOTE — Telephone Encounter (Signed)
Lm to call back ./cy 

## 2021-01-03 NOTE — Telephone Encounter (Signed)
Spoke with Jose Holden and the last couple of days has not checked HR but previously has been running 48-53 Per Jose Holden has not taken any Metoprolol since day before DCCV (8/4)Per Jose Holden feels fine but feels maybe out of rhythm again Jose Holden is taking the Flecainide . Will forward to Dr Quentin Ore for review and recommendations ./cy

## 2021-01-06 ENCOUNTER — Ambulatory Visit (INDEPENDENT_AMBULATORY_CARE_PROVIDER_SITE_OTHER): Payer: 59 | Admitting: Internal Medicine

## 2021-01-06 ENCOUNTER — Encounter: Payer: Self-pay | Admitting: Internal Medicine

## 2021-01-06 ENCOUNTER — Other Ambulatory Visit: Payer: Self-pay

## 2021-01-06 VITALS — BP 102/80 | HR 74 | Temp 97.7°F | Resp 18 | Ht 79.0 in | Wt 198.2 lb

## 2021-01-06 DIAGNOSIS — M79672 Pain in left foot: Secondary | ICD-10-CM | POA: Diagnosis not present

## 2021-01-06 DIAGNOSIS — M79671 Pain in right foot: Secondary | ICD-10-CM | POA: Insufficient documentation

## 2021-01-06 DIAGNOSIS — I482 Chronic atrial fibrillation, unspecified: Secondary | ICD-10-CM

## 2021-01-06 NOTE — Assessment & Plan Note (Signed)
Currently in sinus after cardioversion earlier this month. We discussed if he is performing cello and needs to take propranolol that day he should skip metoprolol (currently taking 1/2 pill daily) and then resume next day.

## 2021-01-06 NOTE — Patient Instructions (Addendum)
I think it is fine to get the covid-19 vaccine.  If you are performing skip the metoprolol in the morning and take the propranolol for the performance.

## 2021-01-06 NOTE — Assessment & Plan Note (Signed)
Referral to podiatry  

## 2021-01-06 NOTE — Progress Notes (Signed)
   Subjective:   Patient ID: Jose Holden, male    DOB: [birth year], 65 y.o.   MRN: [id number]  HPI The patient is a 65 YO man coming in for several questions about his medications and conditions. Needs referral for recurrent pain/numbness in the feet and wishes to return to podiatry.   Review of Systems  Constitutional: Negative.   HENT: Negative.    Eyes: Negative.   Respiratory:  Negative for cough, chest tightness and shortness of breath.   Cardiovascular:  Negative for chest pain, palpitations and leg swelling.  Gastrointestinal:  Negative for abdominal distention, abdominal pain, constipation, diarrhea, nausea and vomiting.  Musculoskeletal:  Positive for myalgias.  Skin: Negative.   Neurological: Negative.   Psychiatric/Behavioral: Negative.     Objective:  Physical Exam Constitutional:      Appearance: He is well-developed.  HENT:     Head: Normocephalic and atraumatic.  Cardiovascular:     Rate and Rhythm: Normal rate and regular rhythm.  Pulmonary:     Effort: Pulmonary effort is normal. No respiratory distress.     Breath sounds: Normal breath sounds. No wheezing or rales.  Abdominal:     General: Bowel sounds are normal. There is no distension.     Palpations: Abdomen is soft.     Tenderness: There is no abdominal tenderness. There is no rebound.  Musculoskeletal:     Cervical back: Normal range of motion.  Skin:    General: Skin is warm and dry.  Neurological:     Mental Status: He is alert and oriented to person, place, and time.     Coordination: Coordination normal.    Vitals:   01/06/21 0806  BP: 102/80  Pulse: 74  Resp: 18  Temp: 97.7 F (36.5 C)  TempSrc: Oral  SpO2: 97%  Weight: 198 lb 3.2 oz (89.9 kg)  Height: '6\' 7"'$  (2.007 m)    This visit occurred during the SARS-CoV-2 public health emergency.  Safety protocols were in place, including screening questions prior to the visit, additional usage of staff PPE, and extensive cleaning of exam  room while observing appropriate contact time as indicated for disinfecting solutions.   Assessment & Plan:

## 2021-01-09 ENCOUNTER — Telehealth: Payer: Self-pay

## 2021-01-09 NOTE — Telephone Encounter (Signed)
Forwarding to EP scheduler to arrange OV w/ AFib clinic or [place] next week

## 2021-01-09 NOTE — Telephone Encounter (Signed)
No, the booster does not give covid-19 but he could have previously been exposed and just happened to get it then. Does he want virtual visit for treatment?

## 2021-01-09 NOTE — Telephone Encounter (Signed)
See below

## 2021-01-09 NOTE — Telephone Encounter (Signed)
pt is wanting to know is it possible to get COVID from getting his booster as he has taken his BOOSTER on friday and tested POS for COVID on Friday as well on an at home test.

## 2021-01-10 ENCOUNTER — Encounter: Payer: Self-pay | Admitting: Internal Medicine

## 2021-01-10 MED ORDER — MOLNUPIRAVIR EUA 200MG CAPSULE: 4.0000 | ORAL_CAPSULE | Freq: Two times a day (BID) | ORAL | 0 refills | Status: AC

## 2021-01-10 NOTE — Telephone Encounter (Signed)
Called patient. LVM asking him to return my call here the office. Office number was provided. Patient is active on mychart I will also send a mychart message.

## 2021-01-11 ENCOUNTER — Telehealth: Payer: 59 | Admitting: Emergency Medicine

## 2021-01-18 ENCOUNTER — Ambulatory Visit (HOSPITAL_COMMUNITY): Payer: 59 | Admitting: Physician Assistant

## 2021-01-18 ENCOUNTER — Telehealth (HOSPITAL_COMMUNITY): Payer: Self-pay | Admitting: Physician Assistant

## 2021-01-18 ENCOUNTER — Ambulatory Visit: Payer: 59 | Admitting: Podiatry

## 2021-01-18 ENCOUNTER — Encounter (HOSPITAL_COMMUNITY): Payer: Self-pay

## 2021-01-18 NOTE — Telephone Encounter (Signed)
Patient had appt scheduled at [facility] for 01/18/21.  Pt called at 1:30 pm stating he had school/work and was unable to come for 3 pm appt.  We were unable to get pt another appt that worked with his schedule prior to his 01/24/21 appt with Dr. Quentin Ore.  Pt aware of appt with Dr. Quentin Ore and will keep that appt.

## 2021-01-24 ENCOUNTER — Ambulatory Visit: Payer: 59 | Admitting: Cardiology

## 2021-01-24 ENCOUNTER — Other Ambulatory Visit: Payer: Self-pay

## 2021-01-24 VITALS — BP 106/64 | HR 96 | Ht 79.0 in | Wt 192.0 lb

## 2021-01-24 DIAGNOSIS — I4819 Other persistent atrial fibrillation: Secondary | ICD-10-CM | POA: Diagnosis not present

## 2021-01-24 LAB — CBC
Hematocrit: 47.4 % (ref 37.5–51.0)
Hemoglobin: 16.1 g/dL (ref 13.0–17.7)
MCH: 29.5 pg (ref 26.6–33.0)
MCHC: 34 g/dL (ref 31.5–35.7)
MCV: 87 fL (ref 79–97)
Platelets: 287 10*3/uL (ref 150–450)
RBC: 5.45 x10E6/uL (ref 4.14–5.80)
RDW: 14.2 % (ref 11.6–15.4)
WBC: 5.7 10*3/uL (ref 3.4–10.8)

## 2021-01-24 LAB — IRON,TIBC AND FERRITIN PANEL
Ferritin: 121 ng/mL (ref 30–400)
Iron Saturation: 45 % (ref 15–55)
Iron: 140 ug/dL (ref 38–169)
Total Iron Binding Capacity: 314 ug/dL (ref 250–450)
UIBC: 174 ug/dL (ref 111–343)

## 2021-01-24 NOTE — Progress Notes (Signed)
Electrophysiology Office Note:    Date:  01/24/2021   ID:  Jose Holden, DOB 02-Jan-1956, MRN [id number]  PCP:  Hoyt Koch, MD  [facility] Cardiologist:  None  [facility] Electrophysiologist:  Vickie Epley   Referring MD: Hoyt Koch, *   Chief Complaint: atrial fibrillation  History of Present Illness:    Jose Holden is a 65 y.o. male who presents for an evaluation of atrial fibrillation at the request of Dr Curt Bears. Their medical history includes GI bleeding, retinal detachment.   The patient tells me in past he had upper GI bleeding while taking a full-strength aspirin daily.  Bleeding stopped after he discontinued his full-strength aspirin.  His hemoglobin has since recovered.  He takes flecainide, metoprolol and continues to have bouts of atrial fibrillation.  He is asymptomatic with his atrial fibrillation.  His CHA2DS2-VASc is 0.   Past Medical History:  Diagnosis Date   A-fib ([place])    On Aspirin only   Anxiety    Cataract    removed left eye that caused retinal detachment x2   ED (erectile dysfunction)    Heart murmur    past hx per pt    Lactose intolerance    Retinal detachment    x2- left eye     Past Surgical History:  Procedure Laterality Date   APPENDECTOMY     CARDIOVERSION N/A 11/09/2020   Procedure: CARDIOVERSION;  Surgeon: Skeet Latch, MD;  Location: [place];  Service: Cardiovascular;  Laterality: N/A;   CARDIOVERSION N/A 12/30/2020   Procedure: CARDIOVERSION;  Surgeon: [name], MD;  Location: [place];  Service: Cardiovascular;  Laterality: N/A;   COLONOSCOPY     10 yr ago [place]- normal per pt    COLONOSCOPY WITH PROPOFOL N/A 09/07/2020   Procedure: COLONOSCOPY WITH PROPOFOL;  Surgeon: Jerene Bears, MD;  Location[facility] ENDOSCOPY;  Service: Gastroenterology;  Laterality: N/A;   ESOPHAGOGASTRODUODENOSCOPY (EGD) WITH PROPOFOL N/A 09/05/2020   Procedure: ESOPHAGOGASTRODUODENOSCOPY (EGD)  WITH PROPOFOL;  Surgeon: Jerene Bears, MD;  Location: [facility]COPY;  Service: Gastroenterology;  Laterality: N/A;   EYE SURGERY     detached retina surgery x2   GIVENS CAPSULE STUDY N/A 09/05/2020   Procedure: GIVENS CAPSULE STUDY;  Surgeon: Jerene Bears, MD;  Location: [facility] ENDOSCOPY;  Service: Gastroenterology;  Laterality: N/A;   GIVENS CAPSULE STUDY N/A 09/10/2020   Procedure: GIVENS CAPSULE STUDY;  Surgeon: Jerene Bears, MD;  Location: [place];  Service: Gastroenterology;  Laterality: N/A;   LAPAROSCOPIC APPENDECTOMY N/A 08/29/2015   Procedure: APPENDECTOMY LAPAROSCOPIC;  Surgeon: Johnathan Hausen, MD;  Location: [facility];  Service: General;  Laterality: N/A;   UPPER GASTROINTESTINAL ENDOSCOPY      Current Medications: Current Meds  Medication Sig   acetaminophen (TYLENOL) 500 MG tablet Take 500-1,000 mg by mouth every 6 (six) hours as needed (headache/pain).   apixaban (ELIQUIS) 5 MG TABS tablet Take 1 tablet (5 mg total) by mouth 2 (two) times daily.   Cholecalciferol (VITAMIN D-3) 125 MCG (5000 UT) TABS Take 5,000 Units by mouth in the morning.   ferrous sulfate ([employer] IRON) 325 (65 FE) MG tablet Take 1 tablet (325 mg total) by mouth 2 (two) times daily with a meal.   flecainide (TAMBOCOR) 100 MG tablet Take 1 tablet (100 mg total) by mouth 2 (two) times daily.   hydrocortisone cream 0.5 % Apply 1 application topically as needed for itching (hemorrhoids/discomfort).   hydroxypropyl methylcellulose / hypromellose (ISOPTO  TEARS / GONIOVISC) 2.5 % ophthalmic solution Place 1 drop into both eyes 3 (three) times daily as needed (dry/irritated eyes).   melatonin 5 MG TABS Take 5 mg by mouth at bedtime.   metoprolol succinate (TOPROL-XL) 25 MG 24 hr tablet Take 1 tablet (25 mg total) by mouth daily. Take with or immediately following a meal.   Naphazoline-Glycerin (CLEAR EYES MAX REDNESS RELIEF) 0.03-0.5 % SOLN Place 1-2 drops into both eyes 3 (three) times daily as needed (dry/irritated eyes.).    Pramox-PE-Glycerin-Petrolatum ([employer] HEMORRHOIDAL) 1-0.25-14.4-15 % CREA Place 1 application rectally as needed (discomfort/hemorrhoids).   selenium 200 MCG TABS tablet Take 200 mcg by mouth in the morning.   Spirulina 500 MG TABS Take 500 mg by mouth in the morning.   zinc gluconate 50 MG tablet Take 50 mg by mouth every other day. In the morning     Allergies:   Patient has no known allergies.   Social History   Socioeconomic History   Marital status: Single    Spouse name: Not on file   Number of children: 0   Years of education: Not on file   Highest education level: Not on file  Occupational History   Occupation: classical Facilities manager  Tobacco Use   Smoking status: Never   Smokeless tobacco: Never  Vaping Use   Vaping Use: Never used  Substance and Sexual Activity   Alcohol use: Yes    Alcohol/week: 0.0 standard drinks    Comment: mod- rare per pt 09-10-2019   Drug use: Never   Sexual activity: Not Currently  Other Topics Concern   Not on file  Social History Narrative   Not on file   Social Determinants of Health   Financial Resource Strain: Not on file  Food Insecurity: Not on file  Transportation Needs: Not on file  Physical Activity: Not on file  Stress: Not on file  Social Connections: Not on file     Family History: The patient's family history includes Brain cancer in his father; Cancer in his father and mother; Pancreatic cancer in his mother; Seizures in his father. There is no history of Colon cancer, Esophageal cancer, Rectal cancer, Colon polyps, or Stomach cancer.  ROS:   Please see the history of present illness.    All other systems reviewed and are negative.  EKGs/Labs/Other Studies Reviewed:    The following studies were reviewed today:  11/03/2020 Echo personally reviewed EF 50% LA dilated Moderate MR  EKG:  The ekg ordered today demonstrates atrial fibrillation, intermittent right bundle branch block    Recent Labs: 06/14/2020: TSH  5.41 09/04/2020: ALT 18 09/11/2020: Magnesium 2.0 10/27/2020: BUN 24; Creatinine, Ser 0.88; Potassium 4.1; Sodium 139 12/20/2020: Hemoglobin 15.0; Platelets 197.0  Recent Lipid Panel    Component Value Date/Time   CHOL 141 06/14/2020 1514   TRIG 56.0 06/14/2020 1514   HDL 39.80 06/14/2020 1514   CHOLHDL 4 06/14/2020 1514   VLDL 11.2 06/14/2020 1514   LDLCALC 90 06/14/2020 1514    Physical Exam:    VS:  BP 106/64   Pulse 96   Ht '6\' 7"'$  (2.007 m)   Wt 192 lb (87.1 kg)   BMI 21.63 kg/m     Wt Readings from Last 3 Encounters:  01/24/21 192 lb (87.1 kg)  01/06/21 198 lb 3.2 oz (89.9 kg)  12/30/20 204 lb 12.9 oz (92.9 kg)     GEN:  Well nourished, well developed in no acute distress HEENT: Normal NECK: No JVD;  No carotid bruits LYMPHATICS: No lymphadenopathy CARDIAC: Irregularly irregular I suspect it is unplugged and where, no murmurs, rubs, gallops RESPIRATORY:  Clear to auscultation without rales, wheezing or rhonchi  ABDOMEN: Soft, non-tender, non-distended MUSCULOSKELETAL:  No edema; No deformity  SKIN: Warm and dry NEUROLOGIC:  Alert and oriented x 3 PSYCHIATRIC:  Normal affect   ASSESSMENT:    1. Persistent atrial fibrillation (HCC)    PLAN:    In order of problems listed above:   #Persistent atrial fibrillation CHA2DS2-VASc of 0.  GI bleeding is not been a recurrent issue since he stopped his full-strength aspirin.  At this point, given his low CHA2DS2-VASc, I do not think we should proceed with watchman evaluation.  If, in the future, his stroke risk changes and he wishes to pursue a stroke prevention strategy that avoids long-term exposure anticoagulation, could certainly reconsider left atrial appendage occlusion.  He continues to have asymptomatic atrial fibrillation despite treatment with flecainide.  I have encouraged him to follow-up with Dr. Curt Bears to discuss alternative antiarrhythmic drug options versus ablation therapy.    Follow-up with Dr.  Curt Bears.        Total time spent with patient today 35 minutes. This includes reviewing records, evaluating the patient and coordinating care.  Medication Adjustments/Labs and Tests Ordered: Current medicines are reviewed at length with the patient today.  Concerns regarding medicines are outlined above.  Orders Placed This Encounter  Procedures   CBC   Iron, TIBC and Ferritin Panel   EKG 12-Lead   No orders of the defined types were placed in this encounter.    Signed, Hilton Cork. Quentin Ore, MD, [facility], [facility] 01/24/2021 8:31 AM    Electrophysiology [facility]

## 2021-01-24 NOTE — Patient Instructions (Addendum)
Medication Instructions:  Your physician recommends that you continue on your current medications as directed. Please refer to the Current Medication list given to you today.  Labwork: CBC, Iron panel  Testing/Procedures: None ordered.  Follow-Up: Your physician wants you to follow-up in: with Dr. Curt Bears 6-8 weeks   Any Other Special Instructions Will Be Listed Below (If Applicable).  If you need a refill on your cardiac medications before your next appointment, please call your pharmacy.

## 2021-01-30 ENCOUNTER — Other Ambulatory Visit: Payer: Self-pay | Admitting: Cardiology

## 2021-01-31 NOTE — Telephone Encounter (Signed)
Prescription refill request for Eliquis received. Indication:Afib  Last office visit: 01/24/21 Quentin Ore) Scr: 0.88 (10/27/20) Age: 65 Weight: 87.1kg  Appropriate dose and refill sent to requested pharmacy.

## 2021-02-08 ENCOUNTER — Other Ambulatory Visit: Payer: Self-pay

## 2021-02-28 ENCOUNTER — Ambulatory Visit: Payer: 59 | Admitting: Podiatry

## 2021-03-06 ENCOUNTER — Ambulatory Visit (INDEPENDENT_AMBULATORY_CARE_PROVIDER_SITE_OTHER): Payer: 59 | Admitting: Podiatry

## 2021-03-06 ENCOUNTER — Other Ambulatory Visit: Payer: Self-pay

## 2021-03-06 ENCOUNTER — Ambulatory Visit (INDEPENDENT_AMBULATORY_CARE_PROVIDER_SITE_OTHER): Payer: 59

## 2021-03-06 DIAGNOSIS — M778 Other enthesopathies, not elsewhere classified: Secondary | ICD-10-CM

## 2021-03-06 DIAGNOSIS — M79671 Pain in right foot: Secondary | ICD-10-CM

## 2021-03-06 DIAGNOSIS — M79672 Pain in left foot: Secondary | ICD-10-CM

## 2021-03-06 DIAGNOSIS — R200 Anesthesia of skin: Secondary | ICD-10-CM | POA: Diagnosis not present

## 2021-03-06 DIAGNOSIS — L84 Corns and callosities: Secondary | ICD-10-CM

## 2021-03-06 MED ORDER — GABAPENTIN 100 MG PO CAPS: 100.0000 mg | ORAL_CAPSULE | Freq: Every day | ORAL | 0 refills | Status: DC

## 2021-03-06 NOTE — Patient Instructions (Signed)
Gabapentin Capsules or Tablets What is this medication? GABAPENTIN (GA ba pen tin) treats nerve pain. It may also be used to prevent and control seizures in people with epilepsy. It works by calming overactivenerves in your body. This medicine may be used for other purposes; ask your health care provider orpharmacist if you have questions. COMMON BRAND NAME(S): Active-PAC with Gabapentin, Gabarone, Gralise, Neurontin What should I tell my care team before I take this medication? They need to know if you have any of these conditions: Alcohol or substance use disorder Kidney disease Lung or breathing disease Suicidal thoughts, plans, or attempt; a previous suicide attempt by you or a family member An unusual or allergic reaction to gabapentin, other medications, foods, dyes, or preservatives Pregnant or trying to get pregnant Breast-feeding How should I use this medication? Take this medication by mouth with a glass of water. Follow the directions on the prescription label. You can take it with or without food. If it upsets your stomach, take it with food. Take your medication at regular intervals. Do not take it more often than directed. Do not stop taking except on your care team'sadvice. If you are directed to break the 600 or 800 mg tablets in half as part of your dose, the extra half tablet should be used for the next dose. If you have notused the extra half tablet within 28 days, it should be thrown away. A special MedGuide will be given to you by the pharmacist with eachprescription and refill. Be sure to read this information carefully each time. Talk to your care team about the use of this medication in children. While this medication may be prescribed for children as young as 3 years for selectedconditions, precautions do apply. Overdosage: If you think you have taken too much of this medicine contact apoison control center or emergency room at once. NOTE: This medicine is only for you.  Do not share this medicine with others. What if I miss a dose? If you miss a dose, take it as soon as you can. If it is almost time for yournext dose, take only that dose. Do not take double or extra doses. What may interact with this medication? Alcohol Antihistamines for allergy, cough, and cold Certain medications for anxiety or sleep Certain medications for depression like amitriptyline, fluoxetine, sertraline Certain medications for seizures like phenobarbital, primidone Certain medications for stomach problems General anesthetics like halothane, isoflurane, methoxyflurane, propofol Local anesthetics like lidocaine, pramoxine, tetracaine Medications that relax muscles for surgery Narcotic medications for pain Phenothiazines like chlorpromazine, mesoridazine, prochlorperazine, thioridazine This list may not describe all possible interactions. Give your health care provider a list of all the medicines, herbs, non-prescription drugs, or dietary supplements you use. Also tell them if you smoke, drink alcohol, or use illegaldrugs. Some items may interact with your medicine. What should I watch for while using this medication? Visit your care team for regular checks on your progress. You may want to keep a record at home of how you feel your condition is responding to treatment. You may want to share this information with your care team at each visit. You should contact your care team if your seizures get worse or if you have any new types of seizures. Do not stop taking this medication or any of your seizure medications unless instructed by your care team. Stopping your medicationsuddenly can increase your seizures or their severity. This medication may cause serious skin reactions. They can happen weeks to months after starting the   medication. Contact your care team right away if you notice fevers or flu-like symptoms with a rash. The rash may be red or purple and then turn into blisters or  peeling of the skin. Or, you might notice a red rash with swelling of the face, lips or lymph nodes in your neck or under yourarms. Wear a medical identification bracelet or chain if you are taking thismedication for seizures. Carry a card that lists all your medications. You may get drowsy, dizzy, or have blurred vision. Do not drive, use machinery, or do anything that needs mental alertness until you know how this medication affects you. To reduce dizzy or fainting spells, do not sit or stand up quickly, especially if you are an older patient. Alcohol can increasedrowsiness and dizziness. Your mouth may get dry. Chewing sugarless gum or sucking hard candy, anddrinking plenty of water may help. Watch for new or worsening thoughts of suicide or depression. This includes sudden changes in mood, behaviors, or thoughts. These changes can happen at any time but are more common in the beginning of treatment or after a change in dose. Call your care team right away if you experience these thoughts orworsening depression. If you become pregnant while using this medication, you may enroll in the [place] American Antiepileptic Drug Pregnancy Registry by calling 1-888-233-2334. This registry collects information about the safety of antiepileptic medication useduring pregnancy. What side effects may I notice from receiving this medication? Side effects that you should report to your care team as soon as possible: Allergic reactions or angioedema-skin rash, itching, hives, swelling of the face, eyes, lips, tongue, arms, or legs, trouble swallowing or breathing Rash, fever, and swollen lymph nodes Thoughts of suicide or self harm, worsening mood, feelings of depression Trouble breathing Unusual changes in mood or behavior in children after use such as difficulty concentrating, hostility, or restlessness Side effects that usually do not require medical attention (report to your careteam if they continue or are  bothersome): Dizziness Drowsiness Nausea Swelling of ankles, feet, or hands Vomiting This list may not describe all possible side effects. Call your doctor for medical advice about side effects. You may report side effects to FDA at1-800-FDA-1088. Where should I keep my medication? Keep out of reach of children and pets. Store at room temperature between 15 and 30 degrees C (59 and 86 degrees F).Get rid of any unused medication after the expiration date. This medication may cause accidental overdose and death if taken by other adults, children, or pets. Mix any unused medication with a substance like cat litter or coffee grounds. Then throw the medication away in a sealed containerlike a sealed bag or a coffee can with a lid. NOTE: This sheet is a summary. It may not cover all possible information. If you have questions about this medicine, talk to your doctor, pharmacist, orhealth care provider.  2022 Elsevier/Gold Standard (2020-03-30 12:16:18)  

## 2021-03-11 NOTE — Progress Notes (Signed)
Subjective:   Patient ID: Jose Holden, male   DOB: 65 y.o.   MRN: [id number]   HPI 65 year old male presents the office today for concerns of bilateral foot pain, numbness.  He states this been ongoing for several years.  He states is numb from the midfoot down he states it feels like it is falling asleep.  Also he has a callus on his foot that is previously received him which helped quite a bit.  He has tried inserts for shoes.  No recent injury.  No other concerns.   Review of Systems  All other systems reviewed and are negative.  Past Medical History:  Diagnosis Date   A-fib ([place])    On Aspirin only   Anxiety    Cataract    removed left eye that caused retinal detachment x2   ED (erectile dysfunction)    Heart murmur    past hx per pt    Lactose intolerance    Retinal detachment    x2- left eye     Past Surgical History:  Procedure Laterality Date   APPENDECTOMY     CARDIOVERSION N/A 11/09/2020   Procedure: CARDIOVERSION;  Surgeon: Skeet Latch, MD;  Location: [place];  Service: Cardiovascular;  Laterality: N/A;   CARDIOVERSION N/A 12/30/2020   Procedure: CARDIOVERSION;  Surgeon: [name], MD;  Location: [place];  Service: Cardiovascular;  Laterality: N/A;   COLONOSCOPY     10 yr ago [place]- normal per pt    COLONOSCOPY WITH PROPOFOL N/A 09/07/2020   Procedure: COLONOSCOPY WITH PROPOFOL;  Surgeon: Jerene Bears, MD;  Location: [facility] ENDOSCOPY;  Service: Gastroenterology;  Laterality: N/A;   ESOPHAGOGASTRODUODENOSCOPY (EGD) WITH PROPOFOL N/A 09/05/2020   Procedure: ESOPHAGOGASTRODUODENOSCOPY (EGD) WITH PROPOFOL;  Surgeon: Jerene Bears, MD;  Location: [facility]COPY;  Service: Gastroenterology;  Laterality: N/A;   EYE SURGERY     detached retina surgery x2   GIVENS CAPSULE STUDY N/A 09/05/2020   Procedure: GIVENS CAPSULE STUDY;  Surgeon: Jerene Bears, MD;  Location: [facility] ENDOSCOPY;  Service: Gastroenterology;  Laterality: N/A;   GIVENS CAPSULE STUDY  N/A 09/10/2020   Procedure: GIVENS CAPSULE STUDY;  Surgeon: Jerene Bears, MD;  Location: [place];  Service: Gastroenterology;  Laterality: N/A;   LAPAROSCOPIC APPENDECTOMY N/A 08/29/2015   Procedure: APPENDECTOMY LAPAROSCOPIC;  Surgeon: Johnathan Hausen, MD;  Location: [facility];  Service: General;  Laterality: N/A;   UPPER GASTROINTESTINAL ENDOSCOPY       Current Outpatient Medications:    gabapentin (NEURONTIN) 100 MG capsule, Take 1 capsule (100 mg total) by mouth at bedtime., Disp: 30 capsule, Rfl: 0   acetaminophen (TYLENOL) 500 MG tablet, Take 500-1,000 mg by mouth every 6 (six) hours as needed (headache/pain)., Disp: , Rfl:    Cholecalciferol (VITAMIN D-3) 125 MCG (5000 UT) TABS, Take 5,000 Units by mouth in the morning., Disp: , Rfl:    ELIQUIS 5 MG TABS tablet, TAKE 1 TABLET BY MOUTH TWICE A DAY, Disp: 60 tablet, Rfl: 3   ferrous sulfate (CVS IRON) 325 (65 FE) MG tablet, Take 1 tablet (325 mg total) by mouth 2 (two) times daily with a meal., Disp: , Rfl: 3   flecainide (TAMBOCOR) 100 MG tablet, Take 1 tablet (100 mg total) by mouth 2 (two) times daily., Disp: 60 tablet, Rfl: 3   hydrocortisone cream 0.5 %, Apply 1 application topically as needed for itching (hemorrhoids/discomfort)., Disp: , Rfl:    hydroxypropyl methylcellulose / hypromellose (ISOPTO TEARS / GONIOVISC) 2.5 %  ophthalmic solution, Place 1 drop into both eyes 3 (three) times daily as needed (dry/irritated eyes)., Disp: , Rfl:    melatonin 5 MG TABS, Take 5 mg by mouth at bedtime., Disp: , Rfl:    metoprolol succinate (TOPROL-XL) 25 MG 24 hr tablet, Take 1 tablet (25 mg total) by mouth daily. Take with or immediately following a meal., Disp: 90 tablet, Rfl: 2   Naphazoline-Glycerin (CLEAR EYES MAX REDNESS RELIEF) 0.03-0.5 % SOLN, Place 1-2 drops into both eyes 3 (three) times daily as needed (dry/irritated eyes.)., Disp: , Rfl:    Pramox-PE-Glycerin-Petrolatum (CVS HEMORRHOIDAL) 1-0.25-14.4-15 % CREA, Place 1 application  rectally as needed (discomfort/hemorrhoids)., Disp: , Rfl:    selenium 200 MCG TABS tablet, Take 200 mcg by mouth in the morning., Disp: , Rfl:    Spirulina 500 MG TABS, Take 500 mg by mouth in the morning., Disp: , Rfl:    zinc gluconate 50 MG tablet, Take 50 mg by mouth every other day. In the morning, Disp: , Rfl:   No Known Allergies        Objective:  Physical Exam  General: AAO x3, NAD  Dermatological: Small hyperkeratotic lesion submetatarsal 5 on the left foot worse than right.  There is no underlying ulceration drainage or any signs of infection.  There is no open lesions.  Vascular: Dorsalis Pedis artery and Posterior Tibial artery pedal pulses are 2/4 bilateral with immedate capillary fill time. There is no pain with calf compression, swelling, warmth, erythema.   Neruologic: Sensation decreased with Semmes Weinstein monofilament bilaterally.  Musculoskeletal: Tenderness to hyperkeratotic lesion left foot but no other areas of discomfort.  Muscular strength 5/5 in all groups tested bilateral.  Adductovarus is present lesser digits.  Gait: Unassisted, Nonantalgic.       Assessment:   65 year old male with numbness bilateral feet, hyperkeratotic lesion     Plan:  -Treatment options discussed including all alternatives, risks, and complications -Etiology of symptoms were discussed -X-rays were obtained and reviewed with the patient.  No evidence of acute fracture or stress fracture. -Sharply debrided the minimal hyperkeratotic lesions without any complications or bleeding.  Recommend moisturizer and offloading daily. -Has been expensing numbness from the midfoot down symmetrical bilaterally.  He has not been diagnosed with neuropathy.  We will refer him to neurology given the numbness (of note he also has a tremor in his hand).  We discussed different medications to try to help him restart gabapentin.  Discussed side effects of medication.  Restarted a low dose at 100 mg  at nighttime and discussed titrating up.  Trula Slade DPM

## 2021-03-20 ENCOUNTER — Ambulatory Visit (INDEPENDENT_AMBULATORY_CARE_PROVIDER_SITE_OTHER): Payer: BC Managed Care – PPO | Admitting: Cardiology

## 2021-03-20 ENCOUNTER — Other Ambulatory Visit: Payer: Self-pay

## 2021-03-20 ENCOUNTER — Encounter: Payer: Self-pay | Admitting: Cardiology

## 2021-03-20 VITALS — BP 122/68 | HR 86 | Ht 79.0 in | Wt 192.0 lb

## 2021-03-20 DIAGNOSIS — I4811 Longstanding persistent atrial fibrillation: Secondary | ICD-10-CM | POA: Diagnosis not present

## 2021-03-20 NOTE — Progress Notes (Signed)
Electrophysiology Office Note   Date:  03/20/2021   ID:  Jose Holden, Jose Holden [birth year], MRN [id number]  PCP:  Hoyt Koch, MD  Cardiologist:   Primary Electrophysiologist:  Kemba Hoppes Meredith Leeds, MD    Chief Complaint: Atrial fibrillation   History of Present Illness: Jose Holden is a 65 y.o. male who is being seen today for the evaluation of atrial fibrillation at the request of Hoyt Koch, *. Presenting today for electrophysiology evaluation.  He has a history significant for atrial fibrillation, retinal detachment, GI bleeds.  He had an EGD, colonoscopy, capsule endoscopy which found no source for the bleeding.  He was started on anticoagulation.  Follows closely with gastroenterology.  He was initially evaluated for watchman, though as he has not had recurrent bleeding, it was thought that this was not his best option.  Today, denies symptoms of palpitations, chest pain, shortness of breath, orthopnea, PND, lower extremity edema, claudication, dizziness, presyncope, syncope, bleeding, or neurologic sequela. The patient is tolerating medications without difficulties.  He remains in atrial fibrillation today.  He is currently feeling well.  He is able do all of his daily activities and is without restriction.  He does not have weakness or fatigue.  He does not have shortness of breath exertion.  He overall feels well.   Past Medical History:  Diagnosis Date   A-fib ([place])    On Aspirin only   Anxiety    Cataract    removed left eye that caused retinal detachment x2   ED (erectile dysfunction)    Heart murmur    past hx per pt    Lactose intolerance    Retinal detachment    x2- left eye    Past Surgical History:  Procedure Laterality Date   APPENDECTOMY     CARDIOVERSION N/A 11/09/2020   Procedure: CARDIOVERSION;  Surgeon: Skeet Latch, MD;  Location: [place];  Service: Cardiovascular;  Laterality: N/A;   CARDIOVERSION N/A 12/30/2020    Procedure: CARDIOVERSION;  Surgeon: [name], MD;  Location: [place];  Service: Cardiovascular;  Laterality: N/A;   COLONOSCOPY     10 yr ago [place]- normal per pt    COLONOSCOPY WITH PROPOFOL N/A 09/07/2020   Procedure: COLONOSCOPY WITH PROPOFOL;  Surgeon: Jerene Bears, MD;  Location: [facility] ENDOSCOPY;  Service: Gastroenterology;  Laterality: N/A;   ESOPHAGOGASTRODUODENOSCOPY (EGD) WITH PROPOFOL N/A 09/05/2020   Procedure: ESOPHAGOGASTRODUODENOSCOPY (EGD) WITH PROPOFOL;  Surgeon: Jerene Bears, MD;  Location: [facility] ENDOSCOPY;  Service: Gastroenterology;  Laterality: N/A;   EYE SURGERY     detached retina surgery x2   GIVENS CAPSULE STUDY N/A 09/05/2020   Procedure: GIVENS CAPSULE STUDY;  Surgeon: Jerene Bears, MD;  Location: [facility] ENDOSCOPY;  Service: Gastroenterology;  Laterality: N/A;   GIVENS CAPSULE STUDY N/A 09/10/2020   Procedure: GIVENS CAPSULE STUDY;  Surgeon: Jerene Bears, MD;  Location: [place];  Service: Gastroenterology;  Laterality: N/A;   LAPAROSCOPIC APPENDECTOMY N/A 08/29/2015   Procedure: APPENDECTOMY LAPAROSCOPIC;  Surgeon: Johnathan Hausen, MD;  [facility];  Service: General;  Laterality: N/A;   UPPER GASTROINTESTINAL ENDOSCOPY       Current Outpatient Medications  Medication Sig Dispense Refill   acetaminophen (TYLENOL) 500 MG tablet Take 500-1,000 mg by mouth every 6 (six) hours as needed (headache/pain).     Cholecalciferol (VITAMIN D-3) 125 MCG (5000 UT) TABS Take 5,000 Units by mouth in the morning.     ELIQUIS 5 MG TABS tablet TAKE  1 TABLET BY MOUTH TWICE A DAY 60 tablet 3   ferrous sulfate (CVS IRON) 325 (65 FE) MG tablet Take 1 tablet (325 mg total) by mouth 2 (two) times daily with a meal.  3   gabapentin (NEURONTIN) 100 MG capsule Take 1 capsule (100 mg total) by mouth at bedtime. 30 capsule 0   hydrocortisone cream 0.5 % Apply 1 application topically as needed for itching (hemorrhoids/discomfort).     hydroxypropyl methylcellulose /  hypromellose (ISOPTO TEARS / GONIOVISC) 2.5 % ophthalmic solution Place 1 drop into both eyes 3 (three) times daily as needed (dry/irritated eyes).     melatonin 5 MG TABS Take 5 mg by mouth at bedtime.     metoprolol succinate (TOPROL-XL) 25 MG 24 hr tablet Take 1 tablet (25 mg total) by mouth daily. Take with or immediately following a meal. 90 tablet 2   Naphazoline-Glycerin (CLEAR EYES MAX REDNESS RELIEF) 0.03-0.5 % SOLN Place 1-2 drops into both eyes 3 (three) times daily as needed (dry/irritated eyes.).     Pramox-PE-Glycerin-Petrolatum (CVS HEMORRHOIDAL) 1-0.25-14.4-15 % CREA Place 1 application rectally as needed (discomfort/hemorrhoids).     selenium 200 MCG TABS tablet Take 200 mcg by mouth in the morning.     Spirulina 500 MG TABS Take 500 mg by mouth in the morning.     zinc gluconate 50 MG tablet Take 50 mg by mouth every other day. In the morning     No current facility-administered medications for this visit.    Allergies:   Patient has no known allergies.   Social History:  The patient  reports that he has never smoked. He has never used smokeless tobacco. He reports current alcohol use. He reports that he does not use drugs.   Family History:  The patient's family history includes Brain cancer in his father; Cancer in his father and mother; Pancreatic cancer in his mother; Seizures in his father.   ROS:  Please see the history of present illness.   Otherwise, review of systems is positive for none.   All other systems are reviewed and negative.   PHYSICAL EXAM: VS:  BP 122/68   Pulse 86   Ht 6\' 7"  (2.007 m)   Wt 192 lb (87.1 kg)   SpO2 97%   BMI 21.63 kg/m  , BMI Body mass index is 21.63 kg/m. GEN: Well nourished, well developed, in no acute distress  HEENT: normal  Neck: no JVD, carotid bruits, or masses Cardiac: Irregular; no murmurs, rubs, or gallops,no edema  Respiratory:  clear to auscultation bilaterally, normal work of breathing GI: soft, nontender,  nondistended, + BS MS: no deformity or atrophy  Skin: warm and dry Neuro:  Strength and sensation are intact Psych: euthymic mood, full affect  EKG:  EKG is ordered today. Personal review of the ekg ordered shows Atrial fibrillation, intermittent aberrancy   Recent Labs: 06/14/2020: TSH 5.41 09/04/2020: ALT 18 09/11/2020: Magnesium 2.0 10/27/2020: BUN 24; Creatinine, Ser 0.88; Potassium 4.1; Sodium 139 01/24/2021: Hemoglobin 16.1; Platelets 287    Lipid Panel     Component Value Date/Time   CHOL 141 06/14/2020 1514   TRIG 56.0 06/14/2020 1514   HDL 39.80 06/14/2020 1514   CHOLHDL 4 06/14/2020 1514   VLDL 11.2 06/14/2020 1514   LDLCALC 90 06/14/2020 1514     Wt Readings from Last 3 Encounters:  03/20/21 192 lb (87.1 kg)  01/24/21 192 lb (87.1 kg)  01/06/21 198 lb 3.2 oz (89.9 kg)  Other studies Reviewed: Additional studies/ records that were reviewed today include: TTE 2015 Left ventricular systolic function is normal.  LV ejection fraction = 55%.  The left ventricular wall motion is normal.  The right ventricle is borderline dilated.  The right ventricular systolic function is borderline reduced.  The left atrium is mildly dilated.  The right atrium is mildly dilated.  There is mild mitral regurgitation.  Injection of agitated saline showed no right-to-left shunt.  There is no pericardial effusion.    ASSESSMENT AND PLAN:  1.  Longstanding persistent atrial fibrillation: CHA2DS2-VASc of 0.  Currently on Eliquis 5 mg twice daily, flecainide 100 mg twice daily, Toprol-XL 25 mg daily.  High risk medication monitoring via ECG for flecainide.  Fortunately bleeding has not recurred.  He has been turned down for watchman as he has not had any further episodes of GI bleeding.  He remains in atrial fibrillation despite flecainide.  He feels well and has opted for a rate control strategy.  We Artisha Capri continue his Eliquis, but Teren Franckowiak stop his flecainide.  He Shaymus Eveleth think about whether  or not he is okay stopping Eliquis.  I did tell him that his stroke risk is quite low.  2.  GI bleed: CHA2DS2-VASc of 0.  Currently on Eliquis 5 mg twice daily.  No recurrent bleeding..   Current medicines are reviewed at length with the patient today.   The patient does not have concerns regarding his medicines.  The following changes were made today: none  Labs/ tests ordered today include:  Orders Placed This Encounter  Procedures   EKG 12-Lead       Disposition:   FU with Kensey Luepke 3 months  Signed, Haileyann Staiger Meredith Leeds, MD  03/20/2021 8:41 AM     [place] [address] [place] [id number] [phone] (office) [phone] (fax)

## 2021-03-20 NOTE — Patient Instructions (Signed)
Medication Instructions:  Your physician has recommended you make the following change in your medication: STOP Flecainide  *If you need a refill on your cardiac medications before your next appointment, please call your pharmacy*   Lab Work: None ordered   Testing/Procedures: None ordered   Follow-Up: At [facility], you and your health needs are our priority.  As part of our continuing mission to provide you with exceptional heart care, we have created designated Provider Care Teams.  These Care Teams include your primary Cardiologist (physician) and Advanced Practice Providers (APPs -  Physician Assistants and Nurse Practitioners) who all work together to provide you with the care you need, when you need it.  Your next appointment:   6 month(s)  The format for your next appointment:   In Person  Provider:   Will Camnitz, MD    Thank you for choosing [facility]!!   Gimena Buick, RN [phone]        

## 2021-05-18 ENCOUNTER — Telehealth: Payer: Self-pay | Admitting: Internal Medicine

## 2021-05-18 NOTE — Telephone Encounter (Signed)
Medication is not currently listed on patient.  Message left for patient to return call.  Metoprolol is what he should be currently taking.

## 2021-05-18 NOTE — Telephone Encounter (Signed)
Patient checking status of refill request  Informed patient both medications had been discontinued  Patient states he would like to speak w/ the provider when she returns after the holidays regarding the refills

## 2021-05-18 NOTE — Telephone Encounter (Signed)
1.Medication Requested: vardenafil (LEVITRA) 20 MG tablet  propranolol (INDERAL) 20 MG tablet  2. Pharmacy (Name, [place]):  [id number] - [place], [place] - [place] Phone:  [phone]  Fax:  [phone]      3. On Med List: yes  4. Last Visit with PCP: 08.12.22  5. Next visit date with PCP: n/a   Agent: Please be advised that RX refills may take up to 3 business days. We ask that you follow-up with your pharmacy.

## 2021-05-26 MED ORDER — VARDENAFIL HCL 10 MG PO TABS: 10.0000 mg | ORAL_TABLET | Freq: Every day | ORAL | 6 refills | Status: DC | PRN

## 2021-05-26 NOTE — Addendum Note (Signed)
Addended by: Pricilla Holm A on: 05/26/2021 04:40 PM   Modules accepted: Orders

## 2021-05-26 NOTE — Telephone Encounter (Signed)
Sent in [place]. He should not take propranolol and metoprolol together.

## 2021-05-26 NOTE — Telephone Encounter (Signed)
Both medications are no longer listed on his medication list.

## 2021-06-06 ENCOUNTER — Ambulatory Visit: Payer: BC Managed Care – PPO | Admitting: Podiatry

## 2021-06-19 ENCOUNTER — Ambulatory Visit: Payer: BC Managed Care – PPO | Admitting: Podiatry

## 2021-06-22 ENCOUNTER — Telehealth: Payer: Self-pay

## 2021-06-22 ENCOUNTER — Other Ambulatory Visit: Payer: Self-pay | Admitting: Internal Medicine

## 2021-06-22 ENCOUNTER — Other Ambulatory Visit: Payer: Self-pay

## 2021-06-22 MED ORDER — METOPROLOL SUCCINATE ER 25 MG PO TB24: 25.0000 mg | ORAL_TABLET | Freq: Every day | ORAL | 2 refills | Status: DC

## 2021-06-22 MED ORDER — SILDENAFIL CITRATE 100 MG PO TABS: 50.0000 mg | ORAL_TABLET | Freq: Every day | ORAL | 11 refills | Status: DC | PRN

## 2021-06-22 MED ORDER — APIXABAN 5 MG PO TABS: 5.0000 mg | ORAL_TABLET | Freq: Two times a day (BID) | ORAL | 1 refills | Status: DC

## 2021-06-22 NOTE — Telephone Encounter (Signed)
Pt calling in requesting a refill on: sildenafil (REVATIO) 20 MG tablet   Pt states that the vardenafil (LEVITRA) 10 MG tablet is very expensive but doesn't want it to be D/C from med list.  Pharmacy: [id number] - [place], [place]  Pt BO:[phone]  [place] 01/06/21

## 2021-06-22 NOTE — Telephone Encounter (Signed)
Prescription refill request for Eliquis received. Indication: Atrial Fib Last office visit: 03/20/21  Elliot Cousin MD Scr: 0.88 on 10/27/20 Age: 66 Weight: 87.1kg  Based on above findings Eliquis 5mg  twice daily is the appropriate dose.  Pt is due appt with Dr Curt Bears.  Sent message to scheduler to make appt.  Pt will need CBC/BMP at that time.  Refill approved x 2

## 2021-06-22 NOTE — Telephone Encounter (Signed)
Ok to switch 

## 2021-06-26 NOTE — Telephone Encounter (Signed)
Attempted to schedule no ans no vm  

## 2021-07-03 ENCOUNTER — Ambulatory Visit: Payer: BC Managed Care – PPO | Admitting: Podiatry

## 2021-07-03 ENCOUNTER — Other Ambulatory Visit: Payer: Self-pay

## 2021-07-03 DIAGNOSIS — R200 Anesthesia of skin: Secondary | ICD-10-CM

## 2021-07-03 NOTE — Patient Instructions (Signed)
Continue soaking in epsom salts twice a day followed by antibiotic ointment and a band-aid. Can leave uncovered at night. Continue this until completely healed.  If the area has not healed in 2 weeks, call the office for follow-up appointment, or sooner if any problems arise.  Monitor for any signs/symptoms of infection. Call the office immediately if any occur or go directly to the emergency room. Call with any questions/concerns.   Terbinafine Tablets What is this medication? TERBINAFINE (TER bin a feen) treats fungal infections of the nails. It belongs to a group of medications called antifungals. It will not treat infections caused by bacteria or viruses. This medicine may be used for other purposes; ask your health care provider or pharmacist if you have questions. COMMON BRAND NAME(S): Lamisil, Terbinex What should I tell my care team before I take this medication? They need to know if you have any of these conditions: Liver disease An unusual or allergic reaction to terbinafine, other medications, foods, dyes, or preservatives Pregnant or trying to get pregnant Breast-feeding How should I use this medication? Take this medication by mouth with water. Take it as directed on the prescription label at the same time every day. You can take it with or without food. If it upsets your stomach, take it with food. Keep taking it unless your care team tells you to stop. A special MedGuide will be given to you by the pharmacist with each prescription and refill. Be sure to read this information carefully each time. Talk to your care team regarding the use of this medication in children. Special care may be needed. Overdosage: If you think you have taken too much of this medicine contact a poison control center or emergency room at once. NOTE: This medicine is only for you. Do not share this medicine with others. What if I miss a dose? If you miss a dose, take it as soon as you can unless it is  more than 4 hours late. If it is more than 4 hours late, skip the missed dose. Take the next dose at the normal time. What may interact with this medication? Do not take this medication with any of the following: Pimozide Thioridazine This medication may also interact with the following: Beta blockers Caffeine Certain medications for mental health conditions Cimetidine Cyclosporine Medications for fungal infections like fluconazole and ketoconazole Medications for irregular heartbeat like amiodarone, flecainide and propafenone Rifampin Warfarin This list may not describe all possible interactions. Give your health care provider a list of all the medicines, herbs, non-prescription drugs, or dietary supplements you use. Also tell them if you smoke, drink alcohol, or use illegal drugs. Some items may interact with your medicine. What should I watch for while using this medication? Visit your care team for regular checks on your progress. You may need blood work while you are taking this medication. It may be some time before you see the benefit from this medication. This medication may cause serious skin reactions. They can happen weeks to months after starting the medication. Contact your care team right away if you notice fevers or flu-like symptoms with a rash. The rash may be red or purple and then turn into blisters or peeling of the skin. Or, you might notice a red rash with swelling of the face, lips or lymph nodes in your neck or under your arms. This medication can make you more sensitive to the sun. Keep out of the sun, If you cannot avoid being in the  sun, wear protective clothing and sunscreen. Do not use sun lamps or tanning beds/booths. What side effects may I notice from receiving this medication? Side effects that you should report to your care team as soon as possible: Allergic reactions--skin rash, itching, hives, swelling of the face, lips, tongue, or throat Change in sense of  smell Change in taste Infection--fever, chills, cough, or sore throat Liver injury--right upper belly pain, loss of appetite, nausea, light-colored stool, dark yellow or brown urine, yellowing skin or eyes, unusual weakness or fatigue Low red blood cell level--unusual weakness or fatigue, dizziness, headache, trouble breathing Lupus-like syndrome--joint pain, swelling, or stiffness, butterfly-shaped rash on the face, rashes that get worse in the sun, fever, unusual weakness or fatigue Rash, fever, and swollen lymph nodes Redness, blistering, peeling, or loosening of the skin, including inside the mouth Unusual bruising or bleeding Worsening mood, feelings of depression Side effects that usually do not require medical attention (report to your care team if they continue or are bothersome): Diarrhea Gas Headache Nausea Stomach pain Upset stomach This list may not describe all possible side effects. Call your doctor for medical advice about side effects. You may report side effects to FDA at 1-800-FDA-1088. Where should I keep my medication? Keep out of the reach of children and pets. Store between 20 and 25 degrees C (68 and 77 degrees F). Protect from light. Get rid of any unused medication after the expiration date. To get rid of medications that are no longer needed or have expired: Take the medication to a medication take-back program. Check with your pharmacy or law enforcement to find a location. If you cannot return the medication, check the label or package insert to see if the medication should be thrown out in the garbage or flushed down the toilet. If you are not sure, ask your care team. If it is safe to put it in the trash, take the medication out of the container. Mix the medication with cat litter, dirt, coffee grounds, or other unwanted substance. Seal the mixture in a bag or container. Put it in the trash. NOTE: This sheet is a summary. It may not cover all possible  information. If you have questions about this medicine, talk to your doctor, pharmacist, or health care provider.  2022 Elsevier/Gold Standard (2020-12-28 00:00:00)

## 2021-07-06 NOTE — Progress Notes (Signed)
Subjective: 66 year old male presents the office today for follow-up evaluation of numbness to his feet.  He states he tried the gabapentin however does come in sleep disturbance so he stopped taking this.  Significant pain to his feet other than the numbness and he points from the midfoot distally where he gets the symptoms.  Objective: AAO x3, NAD DP/PT pulses palpable bilaterally, CRT less than 3 seconds Sensation somewhat decreased with some proximal monofilament of the forefoot. Not able to elicit any area pinpoint tenderness today.  There is no edema.  Flexor, extensor tendons appear intact. No pain with calf compression, swelling, warmth, erythema  Assessment: Neuropathy, metatarsalgia  Plan: -All treatment options discussed with the patient including all alternatives, risks, complications.  -Given having numbness no pain at this time will refer to neurology.  Previous neurology referral placed for however he is known to go for neurology.  We will update the referral. -If neurology does not feel that there is any neurological issues we will reevaluate.  He has previously tried inserts and offloading without any resolution. -Patient encouraged to call the office with any questions, concerns, change in symptoms.   Trula Slade DPM

## 2021-07-25 ENCOUNTER — Telehealth: Payer: Self-pay | Admitting: Diagnostic Neuroimaging

## 2021-07-25 NOTE — Telephone Encounter (Signed)
LVM for patient that his appointment was rescheduled from April 10 to April 4 due to Dr. Leta Baptist being out and to call us back if that day will not work for him.

## 2021-08-15 ENCOUNTER — Other Ambulatory Visit: Payer: Self-pay | Admitting: Cardiology

## 2021-08-15 DIAGNOSIS — I4819 Other persistent atrial fibrillation: Secondary | ICD-10-CM

## 2021-08-15 NOTE — Telephone Encounter (Signed)
Eliquis '5mg'$  refill request received. Patient is 66 years old, weight-87.1kg, Crea-0.88 on 10/27/2020, Diagnosis-Afib, and last seen by Dr. Curt Bears on 03/20/2021. Dose is appropriate based on dosing criteria. Will send in refill to requested pharmacy.   ?

## 2021-08-29 ENCOUNTER — Ambulatory Visit: Payer: BC Managed Care – PPO | Admitting: Diagnostic Neuroimaging

## 2021-08-29 ENCOUNTER — Encounter: Payer: Self-pay | Admitting: Diagnostic Neuroimaging

## 2021-08-29 VITALS — BP 102/63 | HR 77 | Ht 79.0 in | Wt 180.4 lb

## 2021-08-29 DIAGNOSIS — R6889 Other general symptoms and signs: Secondary | ICD-10-CM | POA: Diagnosis not present

## 2021-08-29 DIAGNOSIS — G252 Other specified forms of tremor: Secondary | ICD-10-CM | POA: Diagnosis not present

## 2021-08-29 DIAGNOSIS — G249 Dystonia, unspecified: Secondary | ICD-10-CM | POA: Diagnosis not present

## 2021-08-29 DIAGNOSIS — R200 Anesthesia of skin: Secondary | ICD-10-CM

## 2021-08-29 DIAGNOSIS — Z79899 Other long term (current) drug therapy: Secondary | ICD-10-CM

## 2021-08-29 NOTE — Progress Notes (Signed)
? ?[facility] ? ?PATIENT: Jose Holden ?DOB: [birth year] ? ?REFERRING CLINICIAN: Trula Slade, DPM ?HISTORY FROM: patient  ?REASON FOR VISIT: new consult  ? ? ?HISTORICAL ? ?CHIEF COMPLAINT:  ?Chief Complaint  ?Patient presents with  ? Numbness  ?  Rm 7 Est Pt with new issue  "constant numbness/pain in both feet x 12 years, getting worse"   ? ? ?HISTORY OF PRESENT ILLNESS:  ? ?UPDATE (08/29/21, VRP): Since last visit, tremor is stable.  Has some tremor of left thumb when playing cello which is stable.  Uses propranolol as needed. ? ?Also wants to discuss mild gradual onset of numbness and tingling in the toes and distal feet starting around 10 years ago.  Symptoms have been slowly changing over time.  No significant pain. ? ? ?PRIOR HPI (406): 66 year old male here for evaluation of left hand tremor.  Patient is a Counselling psychologist, playing since childhood.  In 1993 he had some type of injury strain to his left hand digit 4.  Around 2001 he noticed tremor in his left thumb when his hand with certain position on the cello.  Symptoms are described to worsen over time.  He is able to compensate little bit for this.  It is affecting his ability to perform.  Symptoms worse when he is under stress or performing for an audience.  He notices some symptoms during practice. ? ?Patient also has been on propranolol intermittently for "performance anxiety".  He uses this few times a month with mild relief. ? ? ?REVIEW OF SYSTEMS: Full 14 system review of systems performed and negative with exception of: As per HPI. ? ?ALLERGIES: ?No Known Allergies ? ?HOME MEDICATIONS: ?Outpatient Medications Prior to Visit  ?Medication Sig Dispense Refill  ? acetaminophen (TYLENOL) 500 MG tablet Take 500-1,000 mg by mouth every 6 (six) hours as needed (headache/pain).    ? apixaban (ELIQUIS) 5 MG TABS tablet TAKE 1 TABLET BY MOUTH TWICE A DAY 60 tablet 5  ? Cholecalciferol (VITAMIN D-3) 125 MCG (5000 UT) TABS Take  5,000 Units by mouth in the morning.    ? ferrous sulfate ([facility] IRON) 325 (65 FE) MG tablet Take 1 tablet (325 mg total) by mouth 2 (two) times daily with a meal.  3  ? gabapentin (NEURONTIN) 100 MG capsule Take 1 capsule (100 mg total) by mouth at bedtime. 30 capsule 0  ? hydrocortisone cream 0.5 % Apply 1 application topically as needed for itching (hemorrhoids/discomfort).    ? hydroxypropyl methylcellulose / hypromellose (ISOPTO TEARS / GONIOVISC) 2.5 % ophthalmic solution Place 1 drop into both eyes 3 (three) times daily as needed (dry/irritated eyes).    ? melatonin 5 MG TABS Take 5 mg by mouth at bedtime.    ? metoprolol succinate (TOPROL-XL) 25 MG 24 hr tablet Take 1 tablet (25 mg total) by mouth daily. Take with or immediately following a meal. 90 tablet 2  ? Naphazoline-Glycerin (CLEAR EYES MAX REDNESS RELIEF) 0.03-0.5 % SOLN Place 1-2 drops into both eyes 3 (three) times daily as needed (dry/irritated eyes.).    ? Pramox-PE-Glycerin-Petrolatum ([facility] HEMORRHOIDAL) 1-0.25-14.4-15 % CREA Place 1 application rectally as needed (discomfort/hemorrhoids).    ? selenium 200 MCG TABS tablet Take 200 mcg by mouth in the morning.    ? sildenafil (VIAGRA) 100 MG tablet Take 0.5-1 tablets (50-100 mg total) by mouth daily as needed for erectile dysfunction. 20 tablet 11  ? Spirulina 500 MG TABS Take 500 mg by mouth in the morning.    ?  vardenafil (LEVITRA) 10 MG tablet Take 1 tablet (10 mg total) by mouth daily as needed for erectile dysfunction. 10 tablet 6  ? zinc gluconate 50 MG tablet Take 50 mg by mouth every other day. In the morning    ? ?No facility-administered medications prior to visit.  ? ? ?PAST MEDICAL HISTORY: ?Past Medical History:  ?Diagnosis Date  ? A-fib ([place])   ? On Aspirin only  ? Anxiety   ? Cataract   ? removed left eye that caused retinal detachment x2  ? ED (erectile dysfunction)   ? Heart murmur   ? past hx per pt   ? Lactose intolerance   ? Retinal detachment   ? x2- left eye   ? ? ?PAST  SURGICAL HISTORY: ?Past Surgical History:  ?Procedure Laterality Date  ? APPENDECTOMY    ? CARDIOVERSION N/A 11/09/2020  ? Procedure: CARDIOVERSION;  Surgeon: Skeet Latch, MD;  Location: [facility];  Service: Cardiovascular;  Laterality: N/A;  ? CARDIOVERSION N/A 12/30/2020  ? Procedure: CARDIOVERSION;  Surgeon: [name], MD;  Location: [facility] ENDOSCOPY;  Service: Cardiovascular;  Laterality: N/A;  ? COLONOSCOPY    ? 10 yr ago [place]- normal per pt   ? COLONOSCOPY WITH PROPOFOL N/A 09/07/2020  ? Procedure: COLONOSCOPY WITH PROPOFOL;  Surgeon: Jerene Bears, MD;  Location: [place];  Service: Gastroenterology;  Laterality: N/A;  ? ESOPHAGOGASTRODUODENOSCOPY (EGD) WITH PROPOFOL N/A 09/05/2020  ? Procedure: ESOPHAGOGASTRODUODENOSCOPY (EGD) WITH PROPOFOL;  Surgeon: Jerene Bears, MD;  Location[facility] ENDOSCOPY;  Service: Gastroenterology;  Laterality: N/A;  ? EYE SURGERY    ? detached retina surgery x2  ? GIVENS CAPSULE STUDY N/A 09/05/2020  ? Procedure: GIVENS CAPSULE STUDY;  Surgeon: Jerene Bears, MD;  Location: [facility];  Service: Gastroenterology;  Laterality: N/A;  ? GIVENS CAPSULE STUDY N/A 09/10/2020  ? Procedure: GIVENS CAPSULE STUDY;  Surgeon: Jerene Bears, MD;  Location: [facility];  Service: Gastroenterology;  Laterality: N/A;  ? LAPAROSCOPIC APPENDECTOMY N/A 08/29/2015  ? Procedure: APPENDECTOMY LAPAROSCOPIC;  Surgeon: Johnathan Hausen, MD;  Location: [facility];  Service: General;  Laterality: N/A;  ? UPPER GASTROINTESTINAL ENDOSCOPY    ? ? ?FAMILY HISTORY: ?Family History  ?Problem Relation Age of Onset  ? Cancer Mother   ?     Pancreas CA  ? Pancreatic cancer Mother   ? Cancer Father   ?     Brain CA  ? Brain cancer Father   ? Seizures Father   ?     grand mal  ? Colon cancer Neg Hx   ? Esophageal cancer Neg Hx   ? Rectal cancer Neg Hx   ? Colon polyps Neg Hx   ? Stomach cancer Neg Hx   ? ? ?SOCIAL HISTORY: ?Social History  ? ?Socioeconomic History  ? Marital status: Single  ?  Spouse name:  Not on file  ? Number of children: 0  ? Years of education: Not on file  ? Highest education level: Not on file  ?Occupational History  ? Occupation: classical Facilities manager  ?Tobacco Use  ? Smoking status: Never  ? Smokeless tobacco: Never  ?Vaping Use  ? Vaping Use: Never used  ?Substance and Sexual Activity  ? Alcohol use: Yes  ?  Alcohol/week: 0.0 standard drinks  ?  Comment: mod- rare per pt 09-10-2019  ? Drug use: Never  ? Sexual activity: Not Currently  ?Other Topics Concern  ? Not on file  ?Social History Narrative  ? Not on  file  ? ?Social Determinants of Health  ? ?Financial Resource Strain: Not on file  ?Food Insecurity: Not on file  ?Transportation Needs: Not on file  ?Physical Activity: Not on file  ?Stress: Not on file  ?Social Connections: Not on file  ?Intimate Partner Violence: Not on file  ? ? ? ?PHYSICAL EXAM ? ?GENERAL EXAM/CONSTITUTIONAL: ?Vitals:  ?Vitals:  ? 08/29/21 1557  ?BP: 102/63  ?Pulse: 77  ?Weight: 180 lb 6.4 oz (81.8 kg)  ?Height: '6\' 7"'$  (2.007 m)  ? ?Body mass index is 20.32 kg/m?. ?Wt Readings from Last 3 Encounters:  ?08/29/21 180 lb 6.4 oz (81.8 kg)  ?03/20/21 192 lb (87.1 kg)  ?01/24/21 192 lb (87.1 kg)  ? ?Patient is in no distress; well developed, nourished and groomed; neck is supple ? ?CARDIOVASCULAR: ?Examination of carotid arteries is normal; no carotid bruits ?Regular rate and rhythm, no murmurs ?Examination of peripheral vascular system by observation and palpation is normal ? ?EYES: ?Ophthalmoscopic exam of optic discs and posterior segments is normal; no papilledema or hemorrhages ?No results found. ? ?MUSCULOSKELETAL: ?Gait, strength, tone, movements noted in Neurologic exam below ? ?NEUROLOGIC: ?MENTAL STATUS:  ?   ? View : No data to display.  ?  ?  ?  ? ?awake, alert, oriented to person, place and time ?recent and remote memory intact ?normal attention and concentration ?language fluent, comprehension intact, naming intact ?fund of knowledge appropriate ? ?CRANIAL NERVE:   ?2nd - no papilledema on fundoscopic exam ?2nd, 3rd, 4th, 6th - pupils equal and reactive to light, visual fields full to confrontation, extraocular muscles intact, no nystagmus ?5th - facial sensation symme

## 2021-09-04 ENCOUNTER — Institutional Professional Consult (permissible substitution): Payer: BC Managed Care – PPO | Admitting: Diagnostic Neuroimaging

## 2021-09-28 ENCOUNTER — Ambulatory Visit: Payer: BC Managed Care – PPO | Admitting: Internal Medicine

## 2021-11-15 ENCOUNTER — Telehealth: Payer: Self-pay | Admitting: Internal Medicine

## 2021-11-15 NOTE — Telephone Encounter (Signed)
Pt requested to be placed back on Propanolol  Pt has new Pharmacy to report [facility] [phone]

## 2021-11-16 ENCOUNTER — Other Ambulatory Visit: Payer: BC Managed Care – PPO | Admitting: Internal Medicine

## 2021-11-17 ENCOUNTER — Ambulatory Visit: Payer: BC Managed Care – PPO | Admitting: Internal Medicine

## 2021-11-17 ENCOUNTER — Encounter: Payer: Self-pay | Admitting: Internal Medicine

## 2021-11-17 ENCOUNTER — Other Ambulatory Visit: Payer: Self-pay | Admitting: Internal Medicine

## 2021-11-17 VITALS — BP 120/62 | HR 80 | Resp 18 | Ht 79.0 in | Wt 191.4 lb

## 2021-11-17 DIAGNOSIS — I482 Chronic atrial fibrillation, unspecified: Secondary | ICD-10-CM

## 2021-11-17 DIAGNOSIS — Z23 Encounter for immunization: Secondary | ICD-10-CM | POA: Diagnosis not present

## 2021-11-17 DIAGNOSIS — R251 Tremor, unspecified: Secondary | ICD-10-CM

## 2021-11-17 DIAGNOSIS — H6123 Impacted cerumen, bilateral: Secondary | ICD-10-CM | POA: Diagnosis not present

## 2021-11-17 DIAGNOSIS — N529 Male erectile dysfunction, unspecified: Secondary | ICD-10-CM | POA: Diagnosis not present

## 2021-11-17 DIAGNOSIS — Z0000 Encounter for general adult medical examination without abnormal findings: Secondary | ICD-10-CM

## 2021-11-17 MED ORDER — SILDENAFIL CITRATE 100 MG PO TABS: 50.0000 mg | ORAL_TABLET | Freq: Every day | ORAL | 11 refills | Status: DC | PRN

## 2021-11-17 MED ORDER — PROPRANOLOL HCL 20 MG PO TABS: 20.0000 mg | ORAL_TABLET | Freq: Two times a day (BID) | ORAL | 3 refills | Status: DC | PRN

## 2021-11-17 NOTE — Assessment & Plan Note (Signed)
Right ear irrigated today

## 2021-11-18 LAB — CBC WITH DIFFERENTIAL/PLATELET
Absolute Monocytes: 566 cells/uL (ref 200–950)
Basophils Absolute: 41 cells/uL (ref 0–200)
Basophils Relative: 0.8 %
Eosinophils Absolute: 168 cells/uL (ref 15–500)
Eosinophils Relative: 3.3 %
HCT: 42.7 % (ref 38.5–50.0)
Hemoglobin: 14.6 g/dL (ref 13.2–17.1)
Lymphs Abs: 995 cells/uL (ref 850–3900)
MCH: 31.1 pg (ref 27.0–33.0)
MCHC: 34.2 g/dL (ref 32.0–36.0)
MCV: 91 fL (ref 80.0–100.0)
MPV: 11.3 fL (ref 7.5–12.5)
Monocytes Relative: 11.1 %
Neutro Abs: 3330 cells/uL (ref 1500–7800)
Neutrophils Relative %: 65.3 %
Platelets: 211 10*3/uL (ref 140–400)
RBC: 4.69 10*6/uL (ref 4.20–5.80)
RDW: 12.9 % (ref 11.0–15.0)
Total Lymphocyte: 19.5 %
WBC: 5.1 10*3/uL (ref 3.8–10.8)

## 2021-11-18 LAB — LIPID PANEL
Cholesterol: 130 mg/dL (ref <200)
HDL: 45 mg/dL (ref >=40)
LDL Cholesterol (Calc): 71 mg/dL (calc)
Non-HDL Cholesterol (Calc): 85 mg/dL (calc) (ref <130)
Total CHOL/HDL Ratio: 2.9 (calc) (ref <5.0)
Triglycerides: 63 mg/dL (ref <150)

## 2021-11-18 LAB — COMPREHENSIVE METABOLIC PANEL
AG Ratio: 1.7 (calc) (ref 1.0–2.5)
ALT: 27 U/L (ref 9–46)
AST: 26 U/L (ref 10–35)
Albumin: 4 g/dL (ref 3.6–5.1)
Alkaline phosphatase (APISO): 73 U/L (ref 35–144)
BUN: 16 mg/dL (ref 7–25)
CO2: 28 mmol/L (ref 20–32)
Calcium: 8.9 mg/dL (ref 8.6–10.3)
Chloride: 107 mmol/L (ref 98–110)
Creat: 0.9 mg/dL (ref 0.70–1.35)
Globulin: 2.3 g/dL (calc) (ref 1.9–3.7)
Glucose, Bld: 95 mg/dL (ref 65–99)
Potassium: 4.3 mmol/L (ref 3.5–5.3)
Sodium: 141 mmol/L (ref 135–146)
Total Bilirubin: 0.9 mg/dL (ref 0.2–1.2)
Total Protein: 6.3 g/dL (ref 6.1–8.1)

## 2021-12-20 ENCOUNTER — Encounter: Payer: Self-pay | Admitting: Internal Medicine

## 2021-12-20 ENCOUNTER — Ambulatory Visit: Payer: BC Managed Care – PPO | Admitting: Internal Medicine

## 2021-12-20 DIAGNOSIS — I482 Chronic atrial fibrillation, unspecified: Secondary | ICD-10-CM

## 2021-12-20 NOTE — Progress Notes (Signed)
   Subjective:   Patient ID: Jose Holden, male    DOB: [birth year], 66 y.o.   MRN: [id number]  HPI The patient is a 66 YO man coming in for concerns about health.  Review of Systems  Constitutional: Negative.   HENT: Negative.    Eyes: Negative.   Respiratory:  Negative for cough, chest tightness and shortness of breath.   Cardiovascular:  Negative for chest pain, palpitations and leg swelling.  Gastrointestinal:  Negative for abdominal distention, abdominal pain, constipation, diarrhea, nausea and vomiting.  Musculoskeletal: Negative.   Skin: Negative.   Neurological: Negative.   Psychiatric/Behavioral: Negative.      Objective:  Physical Exam Constitutional:      Appearance: He is well-developed.  HENT:     Head: Normocephalic and atraumatic.  Cardiovascular:     Rate and Rhythm: Normal rate and regular rhythm.  Pulmonary:     Effort: Pulmonary effort is normal. No respiratory distress.     Breath sounds: Normal breath sounds. No wheezing or rales.  Abdominal:     General: Bowel sounds are normal. There is no distension.     Palpations: Abdomen is soft.     Tenderness: There is no abdominal tenderness. There is no rebound.  Musculoskeletal:     Cervical back: Normal range of motion.  Skin:    General: Skin is warm and dry.  Neurological:     Mental Status: He is alert and oriented to person, place, and time.     Coordination: Coordination normal.     Vitals:   12/20/21 0913  BP: 122/78  Pulse: 60  Resp: 18  SpO2: 96%  Weight: 196 lb 9.6 oz (89.2 kg)  Height: '6\' 7"'$  (2.007 m)    Assessment & Plan:  Visit time 15 minutes in face to face communication with patient and coordination of care, additional 5 minutes spent in record review, coordination or care, ordering tests, communicating/referring to other healthcare professionals, documenting in medical records all on the same day of the visit for total time 20 minutes spent on the visit.

## 2021-12-20 NOTE — Assessment & Plan Note (Signed)
Stress can cause A fib flares and counseled that his high stress work environment can be a detriment to his health. Letter done describing same.

## 2021-12-27 ENCOUNTER — Telehealth: Payer: Self-pay | Admitting: Internal Medicine

## 2021-12-27 NOTE — Telephone Encounter (Signed)
Patient would like a refill of his Levitra and his sidinifil - He would like to try taking both and checking to see which works better -   Please send both prescriptions to [facility] on [address].  Please call the patient if there is a problem with sending both.

## 2021-12-28 MED ORDER — SILDENAFIL CITRATE 100 MG PO TABS: 100.0000 mg | ORAL_TABLET | Freq: Every day | ORAL | 11 refills | Status: DC | PRN

## 2021-12-28 MED ORDER — VARDENAFIL HCL 20 MG PO TABS: 10.0000 mg | ORAL_TABLET | Freq: Every day | ORAL | 11 refills | Status: AC | PRN

## 2022-01-02 IMAGING — NM NM BOWEL IMG MECKELS
1 series · 6 of 6 positions shown · non-contrast
Comparison: CT 09/09/2020

CLINICAL DATA: Gastrointestinal bleeding is 65 year old male

EXAM:
NUCLEAR MEDICINE BOWEL (KLPIGBB) SCAN
TECHNIQUE: Sequential abdominal images were obtained following intravenous
injection of radiopharmaceutical.
RADIOPHARMACEUTICALS:  10.6 mCi Nc-11m pertechnetate IV

[Series 1: meckels dynamic · 4.14mm/px · 6 of 60 frames shown]
[frame 6/60]
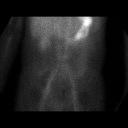
[frame 16/60]
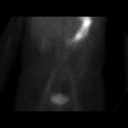
[frame 26/60]
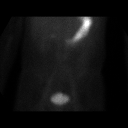
[frame 36/60]
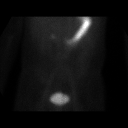
[frame 46/60]
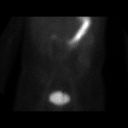
[frame 56/60]
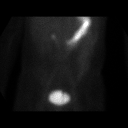

[6 of 6 positions shown; findings below may reference images not displayed]

FINDINGS: No foci abnormal radiotracer accumulation within the abdomen pelvis
to localize ectopic gastric mucosa.

Physiologic uptake noted within the stomach and excreted into the GU
tract.
IMPRESSION: No evidence ectopic gastric mucosa (Meckel's diverticulum)

## 2022-01-30 DIAGNOSIS — Z961 Presence of intraocular lens: Secondary | ICD-10-CM | POA: Insufficient documentation

## 2022-01-30 DIAGNOSIS — Z8669 Personal history of other diseases of the nervous system and sense organs: Secondary | ICD-10-CM | POA: Insufficient documentation

## 2022-01-30 DIAGNOSIS — H2511 Age-related nuclear cataract, right eye: Secondary | ICD-10-CM | POA: Insufficient documentation

## 2022-01-30 DIAGNOSIS — H59812 Chorioretinal scars after surgery for detachment, left eye: Secondary | ICD-10-CM | POA: Insufficient documentation

## 2022-02-06 ENCOUNTER — Ambulatory Visit: Payer: BC Managed Care – PPO | Admitting: Internal Medicine

## 2022-02-06 ENCOUNTER — Encounter: Payer: Self-pay | Admitting: Internal Medicine

## 2022-02-06 VITALS — BP 116/82 | HR 84 | Ht 79.0 in | Wt 198.0 lb

## 2022-02-06 DIAGNOSIS — Z23 Encounter for immunization: Secondary | ICD-10-CM | POA: Diagnosis not present

## 2022-02-06 DIAGNOSIS — Z202 Contact with and (suspected) exposure to infections with a predominantly sexual mode of transmission: Secondary | ICD-10-CM | POA: Diagnosis not present

## 2022-02-06 NOTE — Assessment & Plan Note (Signed)
Checking for STDs today and counseled about safe practices.

## 2022-02-06 NOTE — Patient Instructions (Signed)
We will check you today for everything.

## 2022-02-06 NOTE — Progress Notes (Signed)
   Subjective:   Patient ID: Jose Holden, male    DOB: 08/25/1955, 66 y.o.   MRN: [id number]  Exposure to STD Pertinent negatives include no abdominal pain, chest pain, constipation, coughing, diarrhea, nausea, shortness of breath or vomiting.   The patient is a 66 YO man coming in for possible exposure to STD. Wants screening. New partner not active yet with and wants to ensure he is clear.  Review of Systems  Constitutional: Negative.   HENT: Negative.    Eyes: Negative.   Respiratory:  Negative for cough, chest tightness and shortness of breath.   Cardiovascular:  Negative for chest pain, palpitations and leg swelling.  Gastrointestinal:  Negative for abdominal distention, abdominal pain, constipation, diarrhea, nausea and vomiting.  Musculoskeletal: Negative.   Skin: Negative.   Neurological: Negative.   Psychiatric/Behavioral: Negative.      Objective:  Physical Exam Constitutional:      Appearance: He is well-developed.  HENT:     Head: Normocephalic and atraumatic.  Cardiovascular:     Rate and Rhythm: Normal rate and regular rhythm.  Pulmonary:     Effort: Pulmonary effort is normal. No respiratory distress.     Breath sounds: Normal breath sounds. No wheezing or rales.  Abdominal:     General: Bowel sounds are normal. There is no distension.     Palpations: Abdomen is soft.     Tenderness: There is no abdominal tenderness. There is no rebound.  Musculoskeletal:     Cervical back: Normal range of motion.  Skin:    General: Skin is warm and dry.  Neurological:     Mental Status: He is alert and oriented to person, place, and time.     Coordination: Coordination normal.     Vitals:   02/06/22 1022  BP: 116/82  Pulse: 84  SpO2: 97%  Weight: 198 lb (89.8 kg)  Height: '6\' 7"'$  (2.007 m)    Assessment & Plan:  Flu shot given at visit

## 2022-02-07 LAB — HSV 1 AND 2 AB, IGG
HSV 1 Glycoprotein G Ab, IgG: 44.6 index — ABNORMAL HIGH (ref 0.00–0.90)
HSV 2 IgG, Type Spec: <0.91 index (ref 0.00–0.90)

## 2022-02-07 LAB — HIV ANTIBODY (ROUTINE TESTING W REFLEX): HIV 1&2 Ab, 4th Generation: NONREACTIVE

## 2022-02-07 LAB — RPR: RPR Ser Ql: NONREACTIVE

## 2022-02-09 LAB — GC/CHLAMYDIA PROBE AMP
Chlamydia trachomatis, NAA: NEGATIVE
Neisseria Gonorrhoeae by PCR: NEGATIVE

## 2022-02-12 ENCOUNTER — Ambulatory Visit: Payer: BC Managed Care – PPO | Admitting: Internal Medicine

## 2022-02-12 ENCOUNTER — Encounter: Payer: Self-pay | Admitting: Internal Medicine

## 2022-02-12 DIAGNOSIS — Z202 Contact with and (suspected) exposure to infections with a predominantly sexual mode of transmission: Secondary | ICD-10-CM

## 2022-02-12 MED ORDER — TRIAMCINOLONE ACETONIDE 0.1 % EX CREA: 1.0000 | TOPICAL_CREAM | Freq: Two times a day (BID) | CUTANEOUS | 0 refills | Status: DC

## 2022-02-12 NOTE — Assessment & Plan Note (Signed)
Discussed normal results with patient.  

## 2022-02-12 NOTE — Patient Instructions (Signed)
We have sent in the triamcinolone

## 2022-02-12 NOTE — Progress Notes (Signed)
   Subjective:   Patient ID: Jose Holden, male    DOB: December 15, 1955, 66 y.o.   MRN: [id number]  HPI Here to discuss results from recent visit  Review of Systems  Constitutional: Negative.   HENT: Negative.    Eyes: Negative.   Respiratory:  Negative for cough, chest tightness and shortness of breath.   Cardiovascular:  Negative for chest pain, palpitations and leg swelling.  Gastrointestinal:  Negative for abdominal distention, abdominal pain, constipation, diarrhea, nausea and vomiting.  Musculoskeletal: Negative.   Skin: Negative.   Neurological: Negative.   Psychiatric/Behavioral: Negative.      Objective:  Physical Exam Constitutional:      Appearance: He is well-developed.  HENT:     Head: Normocephalic and atraumatic.  Cardiovascular:     Rate and Rhythm: Normal rate and regular rhythm.  Pulmonary:     Effort: Pulmonary effort is normal. No respiratory distress.     Breath sounds: Normal breath sounds. No wheezing or rales.  Abdominal:     General: Bowel sounds are normal. There is no distension.     Palpations: Abdomen is soft.     Tenderness: There is no abdominal tenderness. There is no rebound.  Musculoskeletal:     Cervical back: Normal range of motion.  Skin:    General: Skin is warm and dry.  Neurological:     Mental Status: He is alert and oriented to person, place, and time.     Coordination: Coordination normal.     Vitals:   02/12/22 1013  BP: 116/74  Pulse: 74  SpO2: 96%  Weight: 199 lb (90.3 kg)  Height: '6\' 7"'$  (2.007 m)    Assessment & Plan:

## 2022-02-21 ENCOUNTER — Other Ambulatory Visit: Payer: Self-pay | Admitting: Cardiology

## 2022-02-21 DIAGNOSIS — I4819 Other persistent atrial fibrillation: Secondary | ICD-10-CM

## 2022-02-21 NOTE — Telephone Encounter (Signed)
Eliquis '5mg'$  refill request received. Patient is 66 years old, weight-90.3kg, Crea-0.90 on 11/17/2021, Diagnosis-Afib, and last seen by Dr. Curt Bears on 03/20/2021. Dose is appropriate based on dosing criteria. Will send in refill to requested pharmacy.

## 2022-03-01 DIAGNOSIS — Z9889 Other specified postprocedural states: Secondary | ICD-10-CM | POA: Insufficient documentation

## 2022-03-01 DIAGNOSIS — H40052 Ocular hypertension, left eye: Secondary | ICD-10-CM | POA: Insufficient documentation

## 2022-03-22 ENCOUNTER — Telehealth: Payer: Self-pay | Admitting: Internal Medicine

## 2022-03-22 NOTE — Telephone Encounter (Signed)
[employer] HMO Connect effective 02/25/22, requires prior authorization for [facility]. Cover My Meds provider website : [place] as URGENT so it will process in 2 days. [phone] [employer] Coverage Review [email or web address]  Pt will be out of Eliquis Monday 10/30.  Please check all medications for prior auth per pt request. Pt also request return phone call to confirm when this is complete. [phone]

## 2022-03-23 NOTE — Telephone Encounter (Signed)
Pt called again today: He was hoping that provider would do it yesterday so that I could refill my prescription on Monday, when I will be out of the medication.  Pt also asked if Dr. Sharlet Salina to [place] all my prescriptions with [employer], and that pt get a confirmation call. [phone].  [employer] Coverage Review Dept.   1 [phone]  [email or web address]  *Urgent*

## 2022-03-23 NOTE — Telephone Encounter (Signed)
Informed pt of prescribing doctor for Eliquis:  Ordering and Authorizing Provider:   Constance Haw, MD  [address] [place], [place] [address]  Phone:  [phone]   Fax:  [phone]   LAST WRITTEN 02/21/22

## 2022-03-26 ENCOUNTER — Telehealth: Payer: Self-pay | Admitting: Cardiology

## 2022-03-26 DIAGNOSIS — I4819 Other persistent atrial fibrillation: Secondary | ICD-10-CM

## 2022-03-26 MED ORDER — APIXABAN 5 MG PO TABS: 5.0000 mg | ORAL_TABLET | Freq: Two times a day (BID) | ORAL | 1 refills | Status: DC

## 2022-03-26 NOTE — Telephone Encounter (Signed)
Patient is requesting that this be done today.  He is concerned that he can not go 2 days without his medication - He is extremely upset.  Patient is requesting  a call back urgently.

## 2022-03-26 NOTE — Telephone Encounter (Signed)
Pt c/o medication issue:  1. Name of Medication:   apixaban (ELIQUIS) 5 MG TABS tablet    2. How are you currently taking this medication (dosage and times per day)? TAKE 1 TABLET BY MOUTH TWICE A DAY  3. Are you having a reaction (difficulty breathing--STAT)? No  4. What is your medication issue? Office calling in regards to medication for pt.   Call transferred

## 2022-03-26 NOTE — Telephone Encounter (Signed)
This nurse spoke with Cardiologist office regarding pt's request for refill on Eliquis. Was advised by nurse that they would initiate PA and contact patient to see if they could offer him some samples to utilize until his PA has been approved.

## 2022-03-26 NOTE — Telephone Encounter (Signed)
Tinika -stated spoke to cardiologist -will start the PA for eliquis and get in touch with pt.

## 2022-03-26 NOTE — Telephone Encounter (Signed)
Patient will either need to contact his cardiologist who prescribes this as they would do PA since they would get notification with this or if he is unable to get in touch with them we can do a new rx and then do a PA for him.

## 2022-03-26 NOTE — Telephone Encounter (Addendum)
**Note De-Identified Brelee Renk Obfuscation** Francisco Capuchin (Key: [id number]) Eliquis '5MG'$  tablets   Form Librarian, academic PA Form [phone] NCPDP) Determination: Favorable Message from Plan Prior Auth;Coverage Start Date:03/26/2022;Coverage End Date:03/26/2023  [facility] have [id number] [place] [place](Ph: [phone] and the pt of this approval. [facility] did [facility] and to contact the pt when ready for pick up.

## 2022-03-26 NOTE — Addendum Note (Signed)
**Note De-Identified Lynnita Somma Obfuscation** Addended by: Dennie Fetters on: 03/26/2022 11:39 AM   Modules accepted: Orders

## 2022-03-26 NOTE — Telephone Encounter (Signed)
Please advise 

## 2022-03-26 NOTE — Telephone Encounter (Signed)
Dr Nathanial Millman office called to inform us:   Signed             [employer] HMO Connect effective 02/25/22, requires prior authorization for [facility]. Cover My Meds provider website : Jose Holden as URGENT so it will process in 2 days. [phone] [employer] Coverage Review [email or web address]   Pt will be out of Eliquis Monday 10/30.   Please check all medications for prior auth per pt request. Pt also request return phone call to confirm when this is complete. [phone]        Electronically signed by Arloa Koh D at 03/22/2022  9:47 AM    I advised her that I will forward to our prior auth nurse and we will reach out to the pt.

## 2022-03-26 NOTE — Telephone Encounter (Signed)
Patient called back and wants to know if he should take a 1/2 aspirin to prevent a heart attack or a stroke.  Please advise.

## 2022-06-14 DIAGNOSIS — H25811 Combined forms of age-related cataract, right eye: Secondary | ICD-10-CM | POA: Diagnosis not present

## 2022-06-14 DIAGNOSIS — H52201 Unspecified astigmatism, right eye: Secondary | ICD-10-CM | POA: Diagnosis not present

## 2022-07-05 ENCOUNTER — Encounter (HOSPITAL_COMMUNITY): Payer: Self-pay | Admitting: *Deleted

## 2022-07-10 ENCOUNTER — Ambulatory Visit
Admission: EM | Admit: 2022-07-10 | Discharge: 2022-07-10 | Disposition: A | Discharge status: Home / Self Care | Payer: Medicare HMO | Attending: Urgent Care | Admitting: Urgent Care

## 2022-07-10 DIAGNOSIS — Z113 Encounter for screening for infections with a predominantly sexual mode of transmission: Secondary | ICD-10-CM

## 2022-07-10 DIAGNOSIS — R69 Illness, unspecified: Secondary | ICD-10-CM | POA: Diagnosis not present

## 2022-07-10 NOTE — ED Triage Notes (Signed)
Pt requesting STD screening/blood work-NAD-steady gait

## 2022-07-10 NOTE — ED Provider Notes (Signed)
[facility]RG[facility]  Note:  This document was prepared using Systems analyst and may include unintentional dictation errors.  MRN: [id number] DOB: 1955-06-10  Subjective:   Jose Holden is a 67 y.o. male presenting for routine STI screening through blood work.  Patient has had HSV testing through blood work last year.  Primarily wants to make sure about HIV but is okay with getting an RPR since it would be through the same blood draw.  Denies dysuria, hematuria, urinary frequency, penile discharge, penile swelling, testicular pain, testicular swelling, anal pain, groin pain.   No current facility-administered medications for this encounter.  Current Outpatient Medications:    acetaminophen (TYLENOL) 500 MG tablet, Take 500-1,000 mg by mouth every 6 (six) hours as needed (headache/pain)., Disp: , Rfl:    apixaban (ELIQUIS) 5 MG TABS tablet, Take 1 tablet (5 mg total) by mouth 2 (two) times daily., Disp: 180 tablet, Rfl: 1   Cholecalciferol (VITAMIN D-3) 125 MCG (5000 UT) TABS, Take 5,000 Units by mouth in the morning., Disp: , Rfl:    ferrous sulfate ([facility] IRON) 325 (65 FE) MG tablet, Take 1 tablet (325 mg total) by mouth 2 (two) times daily with a meal., Disp: , Rfl: 3   gabapentin (NEURONTIN) 100 MG capsule, Take 1 capsule (100 mg total) by mouth at bedtime., Disp: 30 capsule, Rfl: 0   hydrocortisone cream 0.5 %, Apply 1 application topically as needed for itching (hemorrhoids/discomfort)., Disp: , Rfl:    hydroxypropyl methylcellulose / hypromellose (ISOPTO TEARS / GONIOVISC) 2.5 % ophthalmic solution, Place 1 drop into both eyes 3 (three) times daily as needed (dry/irritated eyes)., Disp: , Rfl:    melatonin 5 MG TABS, Take 5 mg by mouth at bedtime., Disp: , Rfl:    metoprolol succinate (TOPROL-XL) 25 MG 24 hr tablet, Take 1 tablet (25 mg total) by mouth daily. Take with or immediately following a meal., Disp: 90 tablet, Rfl: 2   Naphazoline-Glycerin  (CLEAR EYES MAX REDNESS RELIEF) 0.03-0.5 % SOLN, Place 1-2 drops into both eyes 3 (three) times daily as needed (dry/irritated eyes.)., Disp: , Rfl:    Pramox-PE-Glycerin-Petrolatum (CVS HEMORRHOIDAL) 1-0.25-14.4-15 % CREA, Place 1 application rectally as needed (discomfort/hemorrhoids)., Disp: , Rfl:    propranolol (INDERAL) 20 MG tablet, Take 1 tablet (20 mg total) by mouth 2 (two) times daily as needed (for performance-induced stress)., Disp: 90 tablet, Rfl: 3   selenium 200 MCG TABS tablet, Take 200 mcg by mouth in the morning., Disp: , Rfl:    sildenafil (VIAGRA) 100 MG tablet, Take 1 tablet (100 mg total) by mouth daily as needed for erectile dysfunction., Disp: 20 tablet, Rfl: 11   Spirulina 500 MG TABS, Take 500 mg by mouth in the morning., Disp: , Rfl:    triamcinolone cream (KENALOG) 0.1 %, Apply 1 Application topically 2 (two) times daily., Disp: 100 g, Rfl: 0   vardenafil (LEVITRA) 20 MG tablet, Take 0.5-1 tablets (10-20 mg total) by mouth daily as needed for erectile dysfunction., Disp: 20 tablet, Rfl: 11   zinc gluconate 50 MG tablet, Take 50 mg by mouth every other day. In the morning, Disp: , Rfl:    No Known Allergies  Past Medical History:  Diagnosis Date   A-fib ([place])    On Aspirin only   Anxiety    Cataract    removed left eye that caused retinal detachment x2   ED (erectile dysfunction)    Heart murmur    past  hx per pt    Lactose intolerance    Retinal detachment    x2- left eye      Past Surgical History:  Procedure Laterality Date   APPENDECTOMY     CARDIOVERSION N/A 11/09/2020   Procedure: CARDIOVERSION;  Surgeon: Skeet Latch, MD;  Location: [place];  Service: Cardiovascular;  Laterality: N/A;   CARDIOVERSION N/A 12/30/2020   Procedure: CARDIOVERSION;  Surgeon: [name], MD;  Location: [place];  Service: Cardiovascular;  Laterality: N/A;   COLONOSCOPY     10 yr ago [place]- normal per pt    COLONOSCOPY WITH PROPOFOL N/A 09/07/2020    Procedure: COLONOSCOPY WITH PROPOFOL;  Surgeon: Jerene Bears, MD;  Location: [place];  Service: Gastroenterology;  Laterality: N/A;   ESOPHAGOGASTRODUODENOSCOPY (EGD) WITH PROPOFOL N/A 09/05/2020   Procedure: ESOPHAGOGASTRODUODENOSCOPY (EGD) WITH PROPOFOL;  Surgeon: Jerene Bears, MD;  Location: [facility] ENDOSCOPY;  Service: Gastroenterology;  Laterality: N/A;   EYE SURGERY     detached retina surgery x2   GIVENS CAPSULE STUDY N/A 09/05/2020   Procedure: GIVENS CAPSULE STUDY;  Surgeon: Jerene Bears, MD;  Location: [facility] ENDOSCOPY;  Service: Gastroenterology;  Laterality: N/A;   GIVENS CAPSULE STUDY N/A 09/10/2020   Procedure: GIVENS CAPSULE STUDY;  Surgeon: Jerene Bears, MD;  Location: [place];  Service: Gastroenterology;  Laterality: N/A;   LAPAROSCOPIC APPENDECTOMY N/A 08/29/2015   Procedure: APPENDECTOMY LAPAROSCOPIC;  Surgeon: Johnathan Hausen, MD;  Location: [facility];  Service: General;  Laterality: N/A;   UPPER GASTROINTESTINAL ENDOSCOPY      Family History  Problem Relation Age of Onset   Cancer Mother        Pancreas CA   Pancreatic cancer Mother    Cancer Father        Brain CA   Brain cancer Father    Seizures Father        grand mal   Colon cancer Neg Hx    Esophageal cancer Neg Hx    Rectal cancer Neg Hx    Colon polyps Neg Hx    Stomach cancer Neg Hx     Social History   Tobacco Use   Smoking status: Never   Smokeless tobacco: Never  Vaping Use   Vaping Use: Never used  Substance Use Topics   Alcohol use: Yes    Comment: occ   Drug use: Never    ROS   Objective:   Vitals: BP 95/62 (BP Location: Right Arm)   Pulse 76   Temp (!) 97.3 F (36.3 C) (Oral)   Resp 18   SpO2 96%   Physical Exam Constitutional:      General: He is not in acute distress.    Appearance: Normal appearance. He is well-developed and normal weight. He is not ill-appearing, toxic-appearing or diaphoretic.  HENT:     Head: Normocephalic and atraumatic.     Right Ear: External  ear normal.     Left Ear: External ear normal.     Nose: Nose normal.     Mouth/Throat:     Pharynx: Oropharynx is clear.  Eyes:     General: No scleral icterus.       Right eye: No discharge.        Left eye: No discharge.     Extraocular Movements: Extraocular movements intact.  Cardiovascular:     Rate and Rhythm: Normal rate.  Pulmonary:     Effort: Pulmonary effort is normal.  Musculoskeletal:     Cervical back:  Normal range of motion.  Neurological:     Mental Status: He is alert and oriented to person, place, and time.  Psychiatric:        Mood and Affect: Mood normal.        Behavior: Behavior normal.        Thought Content: Thought content normal.        Judgment: Judgment normal.     Assessment and Plan :   PDMP not reviewed this encounter.  1. Routine screening for STI (sexually transmitted infection)     Patient declined a cytology as he is asymptomatic and is primarily concerned about HIV.  Labs pending, will notify through [place].   Jaynee Eagles, PA-C 07/10/22 1426

## 2022-07-11 LAB — RPR: RPR Ser Ql: NONREACTIVE

## 2022-07-11 LAB — HIV ANTIBODY (ROUTINE TESTING W REFLEX): HIV Screen 4th Generation wRfx: NONREACTIVE

## 2022-10-08 ENCOUNTER — Ambulatory Visit (INDEPENDENT_AMBULATORY_CARE_PROVIDER_SITE_OTHER): Payer: Medicare HMO | Admitting: Family Medicine

## 2022-10-08 ENCOUNTER — Encounter: Payer: Self-pay | Admitting: Family Medicine

## 2022-10-08 VITALS — BP 110/50 | HR 62 | Temp 98.4°F | Resp 20 | Ht 79.0 in | Wt 200.0 lb

## 2022-10-08 DIAGNOSIS — J069 Acute upper respiratory infection, unspecified: Secondary | ICD-10-CM

## 2022-10-08 NOTE — Progress Notes (Signed)
Assessment & Plan:  1. Viral URI Education provided on viral respiratory infections.  Continue symptom management with Mucinex.  Discussed he should continue to improve and that if he all of a sudden worsens he needs to let us know so that antibiotic can be prescribed.   No results found for any visits on 10/08/22.  Follow up plan: Return if symptoms worsen or fail to improve.  Deliah Boston, MSN, APRN, FNP-C  Subjective:  HPI: Jose Holden is a 67 y.o. male presenting on 10/08/2022 for Cough (Congestion - green - mucinex is helping )  Patient complains of cough and chest congestion. He denies runny nose, sneezing, and fever. Onset of symptoms was 4 days ago, gradually improving since that time. He is drinking plenty of fluids. Evaluation to date: at home COVID test negative. Treatment to date:  Mucinex .  He does not smoke.    ROS: Negative unless specifically indicated above in HPI.   Relevant past medical history reviewed and updated as indicated.   Allergies and medications reviewed and updated.   Current Outpatient Medications:    acetaminophen (TYLENOL) 500 MG tablet, Take 500-1,000 mg by mouth every 6 (six) hours as needed (headache/pain)., Disp: , Rfl:    apixaban (ELIQUIS) 5 MG TABS tablet, Take 1 tablet (5 mg total) by mouth 2 (two) times daily., Disp: 180 tablet, Rfl: 1   Cholecalciferol (VITAMIN D-3) 125 MCG (5000 UT) TABS, Take 5,000 Units by mouth in the morning., Disp: , Rfl:    ferrous sulfate ([facility]S IRON) 325 (65 FE) MG tablet, Take 1 tablet (325 mg total) by mouth 2 (two) times daily with a meal., Disp: , Rfl: 3   hydrocortisone cream 0.5 %, Apply 1 application topically as needed for itching (hemorrhoids/discomfort)., Disp: , Rfl:    melatonin 5 MG TABS, Take 5 mg by mouth at bedtime., Disp: , Rfl:    selenium 200 MCG TABS tablet, Take 200 mcg by mouth in the morning., Disp: , Rfl:    sildenafil (VIAGRA) 100 MG tablet, Take 1 tablet (100 mg total) by mouth  daily as needed for erectile dysfunction., Disp: 20 tablet, Rfl: 11   Spirulina 500 MG TABS, Take 500 mg by mouth in the morning., Disp: , Rfl:    triamcinolone cream (KENALOG) 0.1 %, Apply 1 Application topically 2 (two) times daily., Disp: 100 g, Rfl: 0   vardenafil (LEVITRA) 20 MG tablet, Take 0.5-1 tablets (10-20 mg total) by mouth daily as needed for erectile dysfunction., Disp: 20 tablet, Rfl: 11   zinc gluconate 50 MG tablet, Take 50 mg by mouth every other day. In the morning, Disp: , Rfl:    metoprolol succinate (TOPROL-XL) 25 MG 24 hr tablet, Take 1 tablet (25 mg total) by mouth daily. Take with or immediately following a meal. (Patient not taking: Reported on 10/08/2022), Disp: 90 tablet, Rfl: 2   propranolol (INDERAL) 20 MG tablet, Take 1 tablet (20 mg total) by mouth 2 (two) times daily as needed (for performance-induced stress). (Patient not taking: Reported on 10/08/2022), Disp: 90 tablet, Rfl: 3  No Known Allergies  Objective:   BP (!) 110/50   Pulse 62   Temp 98.4 F (36.9 C)   Resp 20   Ht 6\' 7"  (2.007 m)   Wt 200 lb (90.7 kg)   BMI 22.53 kg/m    Physical Exam Vitals reviewed.  Constitutional:      General: He is not in acute distress.    Appearance: Normal  appearance. He is not ill-appearing, toxic-appearing or diaphoretic.  HENT:     Head: Normocephalic and atraumatic.     Right Ear: Tympanic membrane, ear canal and external ear normal. There is no impacted cerumen.     Left Ear: Tympanic membrane, ear canal and external ear normal. There is no impacted cerumen.     Nose: Nose normal.     Right Sinus: No maxillary sinus tenderness or frontal sinus tenderness.     Left Sinus: No maxillary sinus tenderness or frontal sinus tenderness.     Mouth/Throat:     Mouth: Mucous membranes are moist.     Pharynx: Oropharynx is clear. No oropharyngeal exudate or posterior oropharyngeal erythema.     Tonsils: No tonsillar exudate or tonsillar abscesses.  Eyes:     General:  No scleral icterus.       Right eye: No discharge.        Left eye: No discharge.     Conjunctiva/sclera: Conjunctivae normal.  Cardiovascular:     Rate and Rhythm: Normal rate.  Pulmonary:     Effort: Pulmonary effort is normal. No respiratory distress.  Musculoskeletal:        General: Normal range of motion.     Cervical back: Normal range of motion.  Lymphadenopathy:     Cervical: No cervical adenopathy.  Skin:    General: Skin is warm and dry.  Neurological:     Mental Status: He is alert and oriented to person, place, and time. Mental status is at baseline.  Psychiatric:        Mood and Affect: Mood normal.        Behavior: Behavior normal.        Thought Content: Thought content normal.        Judgment: Judgment normal.

## 2022-10-10 DIAGNOSIS — Z6834 Body mass index (BMI) 34.0-34.9, adult: Secondary | ICD-10-CM | POA: Diagnosis not present

## 2022-10-10 DIAGNOSIS — J069 Acute upper respiratory infection, unspecified: Secondary | ICD-10-CM | POA: Diagnosis not present

## 2022-10-10 DIAGNOSIS — R059 Cough, unspecified: Secondary | ICD-10-CM | POA: Diagnosis not present

## 2022-10-29 ENCOUNTER — Encounter: Payer: Self-pay | Admitting: Gastroenterology

## 2022-11-05 ENCOUNTER — Telehealth: Payer: Self-pay | Admitting: Gastroenterology

## 2022-11-05 NOTE — Telephone Encounter (Signed)
Hi Dr. Russella Dar,  Patient called regarding a recall letter he received and would like to confirm with you when he is due because on file there is a notation saying a 3 year recall would be recommended and his last procedure was in 2022. The patient said he would proceed with whatever your decision is but wanted me to let yo know that if it is not crucial he would rather do it later rather then sooner.  Please advise. Thanks

## 2022-11-05 NOTE — Telephone Encounter (Signed)
Called patient to advise left voicemail. °

## 2022-11-05 NOTE — Telephone Encounter (Signed)
Chart reviewed. Colonoscopy in April 2025 is the correct recall date.

## 2023-02-21 ENCOUNTER — Other Ambulatory Visit: Payer: Self-pay | Admitting: Cardiology

## 2023-02-21 DIAGNOSIS — I4819 Other persistent atrial fibrillation: Secondary | ICD-10-CM

## 2023-02-21 NOTE — Telephone Encounter (Signed)
Pt last saw Dr Elberta Fortis on 03/20/21, pt is overdue for follow-up. Pt was due for a follow-up on 6 months in April of 2023, recall in [place].  Pt needs OV for rx refill, msg sent to schedulers.  Pt is also overdue for labwork, last labs 11/17/21 Creat 0.90.  Will await appt to refill rx.

## 2023-02-25 NOTE — Telephone Encounter (Signed)
Unable to reach pt for an appt/labs.

## 2023-02-26 ENCOUNTER — Ambulatory Visit: Payer: Medicare HMO | Admitting: Internal Medicine

## 2023-02-26 ENCOUNTER — Telehealth: Payer: Self-pay | Admitting: Cardiology

## 2023-02-26 DIAGNOSIS — I4819 Other persistent atrial fibrillation: Secondary | ICD-10-CM

## 2023-02-26 MED ORDER — APIXABAN 5 MG PO TABS: 5.0000 mg | ORAL_TABLET | Freq: Two times a day (BID) | ORAL | 0 refills | Status: DC

## 2023-02-26 NOTE — Telephone Encounter (Signed)
*  STAT* If patient is at the pharmacy, call can be transferred to refill team.   1. Which medications need to be refilled? (please list name of each medication and dose if known) new prescription for Eliquis   2. Would you like to learn more about the convenience, safety, & potential cost savings by using the [facility]?     3. Are you open to using the [facility] (Type [facility]. .   4. Which pharmacy/location (including street and city if local pharmacy) is [facility] to be sent to[facility]   5. Do they need a 30 day or 90 day supply?  *30 days #60 and refills- please call in today- leaving town- patient would like for you to call him, when you call this in

## 2023-02-26 NOTE — Telephone Encounter (Signed)
[facility]   [id number] [place] [place] - [address]

## 2023-02-26 NOTE — Telephone Encounter (Signed)
Prescription refill request for Eliquis received. Indication: Afib  Last office visit: 03/20/21 ([place])  Scr: 0.90 (11/17/21)  Age: 67 Weight: 90.7kg  Labs and office visit overdue.Called and spoke with pt. Pt states he understands he needs an appt with cardiology and needs labs; however, he is going out of town tomorrow and only has 2 pills left. I made pt aware that I can send a 30 day supply to pharmacy but he MUST schedule appt for any further refills. Pt verbalized understanding. Transferred pt to scheduling.

## 2023-03-04 ENCOUNTER — Telehealth: Payer: Self-pay | Admitting: Cardiology

## 2023-03-04 NOTE — Telephone Encounter (Signed)
-----   Message from Nurse Cicero Duck B sent at 02/21/2023 11:59 AM EDT ----- Pt last saw Dr Elberta Fortis on 03/20/21, pt is overdue for follow-up. Pt was due for a follow-up on 6 months in April of 2023, recall in [place].  Pt requesting refill of Eliquis, pt needs OV for rx refill. Please call pt to schedule appt.  Thanks!

## 2023-03-04 NOTE — Telephone Encounter (Signed)
Pt contacted 3x to schedule overdue f/u, unable to LVM. Will send letter to have pt call office to schedule.

## 2023-03-27 ENCOUNTER — Telehealth: Payer: Self-pay | Admitting: Internal Medicine

## 2023-03-27 ENCOUNTER — Other Ambulatory Visit: Payer: Self-pay

## 2023-03-27 DIAGNOSIS — I4819 Other persistent atrial fibrillation: Secondary | ICD-10-CM

## 2023-03-27 MED ORDER — APIXABAN 5 MG PO TABS: 5.0000 mg | ORAL_TABLET | Freq: Two times a day (BID) | ORAL | 0 refills | Status: DC

## 2023-03-27 NOTE — Telephone Encounter (Signed)
Patient said he is running out of one of his medications. He has not been seen in over a year and needs an appointment to get a refill. Patient refused to schedule an appointment because his PCP does not have an opening until next week. He had an appointment scheduled on 02/26/2023, but did not complete the appointment. Patient would like a call back at [phone].

## 2023-04-02 ENCOUNTER — Ambulatory Visit: Payer: Medicare HMO | Admitting: Internal Medicine

## 2023-04-02 ENCOUNTER — Encounter: Payer: Self-pay | Admitting: Internal Medicine

## 2023-04-02 VITALS — BP 118/62 | Temp 97.8°F | Ht 79.0 in | Wt 199.0 lb

## 2023-04-02 DIAGNOSIS — Z23 Encounter for immunization: Secondary | ICD-10-CM | POA: Diagnosis not present

## 2023-04-02 DIAGNOSIS — Z0000 Encounter for general adult medical examination without abnormal findings: Secondary | ICD-10-CM | POA: Diagnosis not present

## 2023-04-02 DIAGNOSIS — F419 Anxiety disorder, unspecified: Secondary | ICD-10-CM | POA: Diagnosis not present

## 2023-04-02 DIAGNOSIS — N529 Male erectile dysfunction, unspecified: Secondary | ICD-10-CM | POA: Diagnosis not present

## 2023-04-02 DIAGNOSIS — I482 Chronic atrial fibrillation, unspecified: Secondary | ICD-10-CM

## 2023-04-02 LAB — COMPREHENSIVE METABOLIC PANEL
ALT: 18 U/L (ref 0–53)
AST: 21 U/L (ref 0–37)
Albumin: 4 g/dL (ref 3.5–5.2)
Alkaline Phosphatase: 60 U/L (ref 39–117)
BUN: 17 mg/dL (ref 6–23)
CO2: 30 meq/L (ref 19–32)
Calcium: 9.1 mg/dL (ref 8.4–10.5)
Chloride: 106 meq/L (ref 96–112)
Creatinine, Ser: 0.88 mg/dL (ref 0.40–1.50)
GFR: 90.06 mL/min (ref >60.00)
Glucose, Bld: 76 mg/dL (ref 70–99)
Potassium: 4 meq/L (ref 3.5–5.1)
Sodium: 140 meq/L (ref 135–145)
Total Bilirubin: 0.7 mg/dL (ref 0.2–1.2)
Total Protein: 6.4 g/dL (ref 6.0–8.3)

## 2023-04-02 LAB — CBC
HCT: 45.4 % (ref 39.0–52.0)
Hemoglobin: 14.8 g/dL (ref 13.0–17.0)
MCHC: 32.5 g/dL (ref 30.0–36.0)
MCV: 93.9 fL (ref 78.0–100.0)
Platelets: 212 10*3/uL (ref 150.0–400.0)
RBC: 4.83 Mil/uL (ref 4.22–5.81)
RDW: 14.3 % (ref 11.5–15.5)
WBC: 5.4 10*3/uL (ref 4.0–10.5)

## 2023-04-02 LAB — LIPID PANEL
Cholesterol: 118 mg/dL (ref 0–200)
HDL: 37.6 mg/dL — ABNORMAL LOW (ref >39.00)
LDL Cholesterol: 69 mg/dL (ref 0–99)
NonHDL: 80.13
Total CHOL/HDL Ratio: 3
Triglycerides: 56 mg/dL (ref 0.0–149.0)
VLDL: 11.2 mg/dL (ref 0.0–40.0)

## 2023-04-02 MED ORDER — CLOPIDOGREL BISULFATE 75 MG PO TABS: 75.0000 mg | ORAL_TABLET | Freq: Every day | ORAL | 3 refills | Status: DC

## 2023-04-02 NOTE — Progress Notes (Unsigned)
   Subjective:   Patient ID: Jose Holden, male    DOB: 10/25/1955, 67 y.o.   MRN: [id number]  HPI The patient is here for physical in addition to welcome to medicare visit  PMH, [facility], social history reviewed and updated  Review of Systems  Constitutional: Negative.   HENT: Negative.    Eyes: Negative.   Respiratory:  Negative for cough, chest tightness and shortness of breath.   Cardiovascular:  Negative for chest pain, palpitations and leg swelling.  Gastrointestinal:  Negative for abdominal distention, abdominal pain, constipation, diarrhea, nausea and vomiting.  Musculoskeletal: Negative.   Skin: Negative.   Neurological: Negative.   Psychiatric/Behavioral: Negative.      Objective:  Physical Exam Constitutional:      Appearance: He is well-developed.  HENT:     Head: Normocephalic and atraumatic.  Cardiovascular:     Rate and Rhythm: Normal rate and regular rhythm.  Pulmonary:     Effort: Pulmonary effort is normal. No respiratory distress.     Breath sounds: Normal breath sounds. No wheezing or rales.  Abdominal:     General: Bowel sounds are normal. There is no distension.     Palpations: Abdomen is soft.     Tenderness: There is no abdominal tenderness. There is no rebound.  Musculoskeletal:     Cervical back: Normal range of motion.  Skin:    General: Skin is warm and dry.  Neurological:     Mental Status: He is alert and oriented to person, place, and time.     Coordination: Coordination normal.     Vitals:   04/02/23 0759  BP: 118/62  Temp: 97.8 F (36.6 C)  TempSrc: Oral  Weight: 199 lb (90.3 kg)  Height: 6\' 7"  (2.007 m)    Assessment & Plan:  Flu shot given at visit

## 2023-04-02 NOTE — Assessment & Plan Note (Signed)
CHADs-vasc 1 and he has elected to do plavix for anticoagulation. Eliquis is too expensive and he is low risk. He is using beta blocker for performance only.

## 2023-04-02 NOTE — Progress Notes (Unsigned)
Subjective:    Jose Holden is a 67 y.o. male who presents for a Welcome to Medicare exam.   Cardiac Risk Factors include: male gender     Objective:    Today's Vitals   04/02/23 0759  BP: 118/62  Temp: 97.8 F (36.6 C)  TempSrc: Oral  Weight: 199 lb (90.3 kg)  Height: 6\' 7"  (2.007 m)   Body mass index is 22.42 kg/m.  Medications Outpatient Encounter Medications as of 04/02/2023  Medication Sig   acetaminophen (TYLENOL) 500 MG tablet Take 500-1,000 mg by mouth every 6 (six) hours as needed (headache/pain).   Cholecalciferol (VITAMIN D-3) 125 MCG (5000 UT) TABS Take 5,000 Units by mouth in the morning.   ferrous sulfate ([employer] IRON) 325 (65 FE) MG tablet Take 1 tablet (325 mg total) by mouth 2 (two) times daily with a meal.   hydrocortisone cream 0.5 % Apply 1 application topically as needed for itching (hemorrhoids/discomfort).   melatonin 5 MG TABS Take 5 mg by mouth at bedtime.   triamcinolone cream (KENALOG) 0.1 % Apply 1 Application topically 2 (two) times daily.   zinc gluconate 50 MG tablet Take 50 mg by mouth every other day. In the morning   [DISCONTINUED] apixaban (ELIQUIS) 5 MG TABS tablet Take 1 tablet (5 mg total) by mouth 2 (two) times daily.   metoprolol succinate (TOPROL-XL) 25 MG 24 hr tablet Take 1 tablet (25 mg total) by mouth daily. Take with or immediately following a meal. (Patient not taking: Reported on 04/02/2023)   propranolol (INDERAL) 20 MG tablet Take 1 tablet (20 mg total) by mouth 2 (two) times daily as needed (for performance-induced stress). (Patient not taking: Reported on 10/08/2022)   selenium 200 MCG TABS tablet Take 200 mcg by mouth in the morning.   sildenafil (VIAGRA) 100 MG tablet Take 1 tablet (100 mg total) by mouth daily as needed for erectile dysfunction.   Spirulina 500 MG TABS Take 500 mg by mouth in the morning.   vardenafil (LEVITRA) 20 MG tablet Take 0.5-1 tablets (10-20 mg total) by mouth daily as needed for erectile  dysfunction.   No facility-administered encounter medications on file as of 04/02/2023.     History: Past Medical History:  Diagnosis Date   A-fib (HCC)    On Aspirin only   Anxiety    Cataract    removed left eye that caused retinal detachment x2   ED (erectile dysfunction)    Heart murmur    past hx per pt    Lactose intolerance    Retinal detachment    x2- left eye    Past Surgical History:  Procedure Laterality Date   APPENDECTOMY     CARDIOVERSION N/A 11/09/2020   Procedure: CARDIOVERSION;  Surgeon: Chilton Si, MD;  Location: [facility] ENDOSCOPY;  Service: Cardiovascular;  Laterality: N/A;   CARDIOVERSION N/A 12/30/2020   Procedure: CARDIOVERSION;  Surgeon: Little Ishikawa, MD;  Location: [facility] ENDOSCOPY;  Service: Cardiovascular;  Laterality: N/A;   COLONOSCOPY     10 yr ago [place]- normal per pt    COLONOSCOPY WITH PROPOFOL N/A 09/07/2020   Procedure: COLONOSCOPY WITH PROPOFOL;  Surgeon: Beverley Fiedler, MD;  Location: [facility] ENDOSCOPY;  Service: Gastroenterology;  Laterality: N/A;   ESOPHAGOGASTRODUODENOSCOPY (EGD) WITH PROPOFOL N/A 09/05/2020   Procedure: ESOPHAGOGASTRODUODENOSCOPY (EGD) WITH PROPOFOL;  Surgeon: Beverley Fiedler, MD;  Location: [facility] ENDOSCOPY;  Service: Gastroenterology;  Laterality: N/A;   EYE SURGERY     detached retina surgery x2  GIVENS CAPSULE STUDY N/A 09/05/2020   Procedure: GIVENS CAPSULE STUDY;  Surgeon: Beverley Fiedler, MD;  Location: [facility] ENDOSCOPY;  Service: Gastroenterology;  Laterality: N/A;   GIVENS CAPSULE STUDY N/A 09/10/2020   Procedure: GIVENS CAPSULE STUDY;  Surgeon: Beverley Fiedler, MD;  Location: [facility] ENDOSCOPY;  Service: Gastroenterology;  Laterality: N/A;   LAPAROSCOPIC APPENDECTOMY N/A 08/29/2015   Procedure: APPENDECTOMY LAPAROSCOPIC;  Surgeon: Luretha Murphy, MD;  Location: [facility] ORS;  Service: General;  Laterality: N/A;   UPPER GASTROINTESTINAL ENDOSCOPY      Family History  Problem Relation Age of Onset   Cancer Mother        Pancreas CA    Pancreatic cancer Mother    Cancer Father        Brain CA   Brain cancer Father    Seizures Father        grand mal   Colon cancer Neg Hx    Esophageal cancer Neg Hx    Rectal cancer Neg Hx    Colon polyps Neg Hx    Stomach cancer Neg Hx    Social History   Occupational History   Occupation: classical Adult nurse  Tobacco Use   Smoking status: Never   Smokeless tobacco: Never  Vaping Use   Vaping status: Never Used  Substance and Sexual Activity   Alcohol use: Yes    Comment: occ   Drug use: Never   Sexual activity: Yes    Tobacco Counseling Counseling given: Not Answered   Immunizations and Health Maintenance Immunization History  Administered Date(s) Administered   Influenza,inj,Quad PF,6+ Mos 05/09/2016, 03/12/2017, 04/30/2018, 02/06/2022   Influenza-Unspecified 03/22/2020   Moderna Covid-19 Fall Seasonal Vaccine 41yrs & older 11/09/2022   Moderna Sars-Covid-2 Vaccination 03/22/2020   PFIZER(Purple Top)SARS-COV-2 Vaccination 08/11/2019, 09/01/2019   Pfizer(Comirnaty)Fall Seasonal Vaccine 12 years and older 03/28/2022   Respiratory Syncytial Virus Vaccine,Recomb Aduvanted(Arexvy) 03/28/2022   Tdap 11/17/2021   Zoster Recombinant(Shingrix) 02/13/2018, 04/30/2018   Health Maintenance Due  Topic Date Due   Medicare Annual Wellness (AWV)  Never done   Pneumonia Vaccine 55+ Years old (1 of 1 - PCV) Never done   INFLUENZA VACCINE  12/27/2022   COVID-19 Vaccine (6 - 2023-24 season) 01/27/2023    Activities of Daily Living    04/02/2023    8:28 AM  In your present state of health, do you have any difficulty performing the following activities:  Hearing? 0  Vision? 0  Difficulty concentrating or making decisions? 0  Walking or climbing stairs? 0  Dressing or bathing? 0  Doing errands, shopping? 0  Preparing Food and eating ? N  Using the Toilet? N  In the past six months, have you accidently leaked urine? N  Do you have problems with loss of bowel control? N   Managing your Medications? N  Managing your Finances? N  Housekeeping or managing your Housekeeping? N    Physical Exam    Advanced Directives: Does Patient Have a Medical Advance Directive?: No Would patient like information on creating a medical advance directive?: No - Patient declined   EKG:  unchanged from previous tracings, atrial fibrillation, rate 58, LAFB last EKG 2022     Assessment:    This is a routine wellness  examination for this patient .   Vision/Hearing screen Vision Screening   Right eye Left eye Both eyes  Without correction 20/200 20/10   With correction      Hearing exam is normal bilaterally   Goals   None  Depression Screen    10/08/2022    2:37 PM 12/20/2021    9:19 AM 11/17/2021    3:37 PM 08/18/2020   10:57 AM  PHQ 2/9 Scores  PHQ - 2 Score 0 0 0 0  PHQ- 9 Score  0 0      Fall Risk    04/02/2023    8:10 AM  Fall Risk   Falls in the past year? 0  Number falls in past yr: 0  Injury with Fall? 0  Follow up Falls evaluation completed    Cognitive Function        04/02/2023    8:29 AM  6CIT Screen  What Year? 0 points  What month? 0 points  What time? 0 points  Count back from 20 0 points  Months in reverse 0 points  Repeat phrase 0 points  Total Score 0 points    Patient Care Team: Myrlene Broker, MD as PCP - General (Internal Medicine) Regan Lemming, MD as PCP - Electrophysiology (Cardiology)     Plan:   I have personally reviewed and noted the following in the patient's chart:   Medical and social history Use of alcohol, tobacco or illicit drugs  Current medications and supplements Functional ability and status Nutritional status Physical activity Advanced directives List of other physicians Hospitalizations, surgeries, and ER visits in previous 12 months Vitals Screenings to include cognitive, depression, and falls Referrals and appointments  In addition, I have reviewed and discussed  with patient certain preventive protocols, quality metrics, and best practice recommendations. A written personalized care plan for preventive services as well as general preventive health recommendations were provided to patient.     Myrlene Broker, MD 04/02/2023

## 2023-04-02 NOTE — Patient Instructions (Addendum)
Your EKG is stable from before.   We have given you the flu shot.    Jose Holden , Thank you for taking time to come for your Medicare Wellness Visit. I appreciate your ongoing commitment to your health goals. Please review the following plan we discussed and let me know if I can assist you in the future.   These are the goals we discussed:  Goals   None     This is a list of the screening recommended for you and due dates:  Health Maintenance  Topic Date Due   Medicare Annual Wellness Visit  Never done   Pneumonia Vaccine (1 of 1 - PCV) Never done   Flu Shot  12/27/2022   COVID-19 Vaccine (6 - 2023-24 season) 01/27/2023   Colon Cancer Screening  09/08/2023   DTaP/Tdap/Td vaccine (2 - Td or Tdap) 11/18/2031   Hepatitis C Screening  Completed   HIV Screening  Completed   Zoster (Shingles) Vaccine  Completed   HPV Vaccine  Aged Out

## 2023-04-03 NOTE — Assessment & Plan Note (Signed)
Using as needed with success.

## 2023-04-03 NOTE — Assessment & Plan Note (Signed)
Flu shot given. Pneumonia he believes he got at pharmacy. Shingrix complete. Tetanus due 2033. Colonoscopy due 2025. Counseled about sun safety and mole surveillance. Counseled about the dangers of distracted driving. Given 10 year screening recommendations.

## 2023-04-03 NOTE — Assessment & Plan Note (Signed)
Managed through coping skills and has left high stress work environment which was causing stress.

## 2023-04-20 ENCOUNTER — Observation Stay (HOSPITAL_COMMUNITY)
Admission: EM | Admit: 2023-04-20 | Discharge: 2023-04-21 | Disposition: A | Discharge status: Home / Self Care | Payer: Medicare HMO | Attending: Internal Medicine | Admitting: Internal Medicine

## 2023-04-20 ENCOUNTER — Encounter (HOSPITAL_COMMUNITY)
Admission: EM | Disposition: A | Discharge status: Home / Self Care | Payer: Self-pay | Source: Home / Self Care | Attending: Emergency Medicine

## 2023-04-20 ENCOUNTER — Emergency Department (HOSPITAL_COMMUNITY): Payer: Medicare HMO

## 2023-04-20 DIAGNOSIS — I319 Disease of pericardium, unspecified: Secondary | ICD-10-CM | POA: Diagnosis not present

## 2023-04-20 DIAGNOSIS — I482 Chronic atrial fibrillation, unspecified: Secondary | ICD-10-CM | POA: Diagnosis present

## 2023-04-20 DIAGNOSIS — I4819 Other persistent atrial fibrillation: Secondary | ICD-10-CM

## 2023-04-20 DIAGNOSIS — I517 Cardiomegaly: Secondary | ICD-10-CM | POA: Diagnosis not present

## 2023-04-20 DIAGNOSIS — I428 Other cardiomyopathies: Secondary | ICD-10-CM | POA: Insufficient documentation

## 2023-04-20 DIAGNOSIS — I2510 Atherosclerotic heart disease of native coronary artery without angina pectoris: Secondary | ICD-10-CM | POA: Diagnosis not present

## 2023-04-20 DIAGNOSIS — I340 Nonrheumatic mitral (valve) insufficiency: Secondary | ICD-10-CM | POA: Insufficient documentation

## 2023-04-20 DIAGNOSIS — Z1152 Encounter for screening for COVID-19: Secondary | ICD-10-CM | POA: Diagnosis not present

## 2023-04-20 DIAGNOSIS — R079 Chest pain, unspecified: Secondary | ICD-10-CM | POA: Diagnosis not present

## 2023-04-20 DIAGNOSIS — Z79899 Other long term (current) drug therapy: Secondary | ICD-10-CM | POA: Insufficient documentation

## 2023-04-20 DIAGNOSIS — R071 Chest pain on breathing: Secondary | ICD-10-CM | POA: Diagnosis not present

## 2023-04-20 DIAGNOSIS — I444 Left anterior fascicular block: Secondary | ICD-10-CM | POA: Insufficient documentation

## 2023-04-20 HISTORY — PX: LEFT HEART CATH AND CORONARY ANGIOGRAPHY: CATH118249

## 2023-04-20 LAB — HEPATIC FUNCTION PANEL
ALT: 56 U/L — ABNORMAL HIGH (ref 0–44)
AST: 64 U/L — ABNORMAL HIGH (ref 15–41)
Albumin: 3.8 g/dL (ref 3.5–5.0)
Alkaline Phosphatase: 65 U/L (ref 38–126)
Bilirubin, Direct: 0.2 mg/dL (ref 0.0–0.2)
Indirect Bilirubin: 1.5 mg/dL — ABNORMAL HIGH (ref 0.3–0.9)
Total Bilirubin: 1.7 mg/dL — ABNORMAL HIGH (ref <1.2)
Total Protein: 6.7 g/dL (ref 6.5–8.1)

## 2023-04-20 LAB — CBC
HCT: 42 % (ref 39.0–52.0)
Hemoglobin: 14.1 g/dL (ref 13.0–17.0)
MCH: 31.5 pg (ref 26.0–34.0)
MCHC: 33.6 g/dL (ref 30.0–36.0)
MCV: 93.8 fL (ref 80.0–100.0)
Platelets: 228 10*3/uL (ref 150–400)
RBC: 4.48 MIL/uL (ref 4.22–5.81)
RDW: 13.5 % (ref 11.5–15.5)
WBC: 8.6 10*3/uL (ref 4.0–10.5)
nRBC: 0 % (ref 0.0–0.2)

## 2023-04-20 LAB — TROPONIN I (HIGH SENSITIVITY)
Troponin I (High Sensitivity): 28 ng/L — ABNORMAL HIGH (ref <18)
Troponin I (High Sensitivity): 29 ng/L — ABNORMAL HIGH (ref <18)

## 2023-04-20 LAB — POCT ACTIVATED CLOTTING TIME: Activated Clotting Time: 228 s

## 2023-04-20 LAB — BASIC METABOLIC PANEL
Anion gap: 8 (ref 5–15)
BUN: 23 mg/dL (ref 8–23)
CO2: 24 mmol/L (ref 22–32)
Calcium: 9.1 mg/dL (ref 8.9–10.3)
Chloride: 103 mmol/L (ref 98–111)
Creatinine, Ser: 0.91 mg/dL (ref 0.61–1.24)
GFR, Estimated: >60 mL/min (ref >60)
Glucose, Bld: 97 mg/dL (ref 70–99)
Potassium: 3.6 mmol/L (ref 3.5–5.1)
Sodium: 135 mmol/L (ref 135–145)

## 2023-04-20 LAB — CG4 I-STAT (LACTIC ACID): Lactic Acid, Venous: 0.3 mmol/L — ABNORMAL LOW (ref 0.5–1.9)

## 2023-04-20 LAB — LIPID PANEL
Cholesterol: 120 mg/dL (ref 0–200)
HDL: 38 mg/dL — ABNORMAL LOW (ref >40)
LDL Cholesterol: 74 mg/dL (ref 0–99)
Total CHOL/HDL Ratio: 3.2 {ratio}
Triglycerides: 38 mg/dL (ref <150)
VLDL: 8 mg/dL (ref 0–40)

## 2023-04-20 LAB — HEMOGLOBIN A1C
Hgb A1c MFr Bld: 5.7 % — ABNORMAL HIGH (ref 4.8–5.6)
Mean Plasma Glucose: 116.89 mg/dL

## 2023-04-20 LAB — BRAIN NATRIURETIC PEPTIDE: B Natriuretic Peptide: 139.7 pg/mL — ABNORMAL HIGH (ref 0.0–100.0)

## 2023-04-20 LAB — TSH: TSH: 2.509 u[IU]/mL (ref 0.350–4.500)

## 2023-04-20 LAB — D-DIMER, QUANTITATIVE: D-Dimer, Quant: <0.27 ug{FEU}/mL (ref 0.00–0.50)

## 2023-04-20 LAB — C-REACTIVE PROTEIN: CRP: 7.3 mg/dL — ABNORMAL HIGH (ref <1.0)

## 2023-04-20 LAB — SEDIMENTATION RATE: Sed Rate: 10 mm/h (ref 0–16)

## 2023-04-20 LAB — SARS CORONAVIRUS 2 BY RT PCR: SARS Coronavirus 2 by RT PCR: NEGATIVE

## 2023-04-20 LAB — MRSA NEXT GEN BY PCR, NASAL: MRSA by PCR Next Gen: NOT DETECTED

## 2023-04-20 SURGERY — LEFT HEART CATH AND CORONARY ANGIOGRAPHY
Anesthesia: LOCAL

## 2023-04-20 MED ORDER — HEPARIN SODIUM (PORCINE) 1000 UNIT/ML IJ SOLN
INTRAMUSCULAR | Status: DC | PRN
  Administered 2023-04-20: 4000 [IU] via INTRAVENOUS

## 2023-04-20 MED ORDER — SODIUM CHLORIDE 0.9% FLUSH
3.0000 mL | Freq: Two times a day (BID) | INTRAVENOUS | Status: DC
  Administered 2023-04-20 – 2023-04-21 (×2): 3 mL via INTRAVENOUS

## 2023-04-20 MED ORDER — HEPARIN SODIUM (PORCINE) 5000 UNIT/ML IJ SOLN
4000.0000 [IU] | Freq: Once | INTRAMUSCULAR | Status: AC
Start: 2023-04-20 — End: 2023-04-20
  Administered 2023-04-20: 4000 [IU] via INTRAVENOUS
  Filled 2023-04-20: qty 1

## 2023-04-20 MED ORDER — VERAPAMIL HCL 2.5 MG/ML IV SOLN
INTRAVENOUS | Status: DC | PRN
  Administered 2023-04-20: 3 mL via INTRA_ARTERIAL
  Administered 2023-04-20: 10 mL via INTRA_ARTERIAL

## 2023-04-20 MED ORDER — ENOXAPARIN SODIUM 40 MG/0.4ML IJ SOSY
40.0000 mg | PREFILLED_SYRINGE | Freq: Every day | INTRAMUSCULAR | Status: DC
  Administered 2023-04-21: 40 mg via SUBCUTANEOUS
  Filled 2023-04-20: qty 0.4

## 2023-04-20 MED ORDER — LIDOCAINE HCL (PF) 1 % IJ SOLN
INTRAMUSCULAR | Status: AC
  Filled 2023-04-20: qty 30

## 2023-04-20 MED ORDER — ONDANSETRON HCL 4 MG/2ML IJ SOLN: 4.0000 mg | Freq: Four times a day (QID) | INTRAMUSCULAR | Status: DC | PRN

## 2023-04-20 MED ORDER — ASPIRIN 81 MG PO CHEW
324.0000 mg | CHEWABLE_TABLET | Freq: Once | ORAL | Status: AC
Start: 2023-04-20 — End: 2023-04-20
  Administered 2023-04-20: 324 mg via ORAL

## 2023-04-20 MED ORDER — HEPARIN SODIUM (PORCINE) 1000 UNIT/ML IJ SOLN
INTRAMUSCULAR | Status: AC
  Filled 2023-04-20: qty 10

## 2023-04-20 MED ORDER — VERAPAMIL HCL 2.5 MG/ML IV SOLN
INTRAVENOUS | Status: AC
  Filled 2023-04-20: qty 2

## 2023-04-20 MED ORDER — IOHEXOL 350 MG/ML SOLN
INTRAVENOUS | Status: DC | PRN
  Administered 2023-04-20: 80 mL via INTRA_ARTERIAL

## 2023-04-20 MED ORDER — COLCHICINE 0.6 MG PO TABS
0.6000 mg | ORAL_TABLET | Freq: Two times a day (BID) | ORAL | Status: DC
  Administered 2023-04-20 – 2023-04-21 (×2): 0.6 mg via ORAL
  Filled 2023-04-20 (×2): qty 1

## 2023-04-20 MED ORDER — HEPARIN (PORCINE) IN NACL 1000-0.9 UT/500ML-% IV SOLN
INTRAVENOUS | Status: DC | PRN
  Administered 2023-04-20 (×2): 500 mL

## 2023-04-20 MED ORDER — HYDRALAZINE HCL 20 MG/ML IJ SOLN: 10.0000 mg | INTRAMUSCULAR | Status: AC | PRN

## 2023-04-20 MED ORDER — MELATONIN 5 MG PO TABS
5.0000 mg | ORAL_TABLET | Freq: Every day | ORAL | Status: DC
  Administered 2023-04-20: 5 mg via ORAL
  Filled 2023-04-20: qty 1

## 2023-04-20 MED ORDER — ACETAMINOPHEN 325 MG PO TABS
650.0000 mg | ORAL_TABLET | ORAL | Status: DC | PRN
  Administered 2023-04-20: 650 mg via ORAL
  Filled 2023-04-20: qty 2

## 2023-04-20 MED ORDER — LABETALOL HCL 5 MG/ML IV SOLN: 10.0000 mg | INTRAVENOUS | Status: AC | PRN

## 2023-04-20 MED ORDER — SODIUM CHLORIDE 0.9 % IV SOLN: INTRAVENOUS | Status: AC

## 2023-04-20 MED ORDER — SODIUM CHLORIDE 0.9% FLUSH
3.0000 mL | INTRAVENOUS | Status: DC | PRN
  Administered 2023-04-21: 3 mL via INTRAVENOUS

## 2023-04-20 MED ORDER — NITROGLYCERIN 0.4 MG SL SUBL
0.4000 mg | SUBLINGUAL_TABLET | SUBLINGUAL | Status: DC | PRN
  Filled 2023-04-20: qty 1

## 2023-04-20 MED ORDER — HEPARIN SODIUM (PORCINE) 5000 UNIT/ML IJ SOLN: 5000.0000 [IU] | Freq: Once | INTRAMUSCULAR | Status: DC

## 2023-04-20 MED ORDER — METOPROLOL SUCCINATE ER 25 MG PO TB24
12.5000 mg | ORAL_TABLET | Freq: Every day | ORAL | Status: DC
  Administered 2023-04-20: 12.5 mg via ORAL
  Filled 2023-04-20 (×2): qty 1

## 2023-04-20 MED ORDER — SODIUM CHLORIDE 0.9 % IV SOLN
INTRAVENOUS | Status: AC | PRN
  Administered 2023-04-20: 50 mL/h via INTRAVENOUS

## 2023-04-20 MED ORDER — SODIUM CHLORIDE 0.9 % IV SOLN: 250.0000 mL | INTRAVENOUS | Status: DC | PRN

## 2023-04-20 MED ORDER — ASPIRIN 81 MG PO CHEW
324.0000 mg | CHEWABLE_TABLET | Freq: Once | ORAL | Status: DC
  Filled 2023-04-20: qty 4

## 2023-04-20 MED ORDER — LIDOCAINE HCL (PF) 1 % IJ SOLN
INTRAMUSCULAR | Status: DC | PRN
  Administered 2023-04-20: 2 mL

## 2023-04-20 SURGICAL SUPPLY — 12 items
CATH INFINITI 5FR ANG PIGTAIL (CATHETERS) IMPLANT
CATH INFINITI JR4 5F (CATHETERS) IMPLANT
CATH LAUNCHER 6FR EBU3.5 (CATHETERS) IMPLANT
DEVICE RAD COMP TR BAND LRG (VASCULAR PRODUCTS) IMPLANT
ELECT DEFIB PAD ADLT CADENCE (PAD) IMPLANT
GLIDESHEATH SLEND SS 6F .021 (SHEATH) IMPLANT
GUIDEWIRE INQWIRE 1.5J.035X260 (WIRE) IMPLANT
INQWIRE 1.5J .035X260CM (WIRE) ×1
KIT ENCORE 26 ADVANTAGE (KITS) IMPLANT
PACK CARDIAC CATHETERIZATION (CUSTOM PROCEDURE TRAY) ×1 IMPLANT
SET ATX-X65L (MISCELLANEOUS) IMPLANT
SHEATH PROBE COVER 6X72 (BAG) IMPLANT

## 2023-04-20 NOTE — ED Notes (Signed)
Transferred via [facility] to [facility] for Cath lab

## 2023-04-20 NOTE — H&P (Signed)
Cardiology Admission History and Physical   Patient ID: Nezar Langer MRN: [id number]; DOB: [birth year]   Admission date: 04/20/2023  PCP:  Myrlene Broker, MD   [facility] Providers Cardiologist:  None  Electrophysiologist:  Will Jorja Loa, MD       Chief Complaint:  Chest pain  Patient Profile:   Mossimo Szatkowski is a 67 y.o. male with history of persistent atrial fibrillation who is being seen 04/20/2023 for the evaluation of chest pain and abnormal EKG.  History of Present Illness:   Mr. Towles reports going for a vigorous run yesterday and subsequently developing pain in the center of his chest, almost as if he had been punched.  The pain has subsided somewhat, though he continues to have a "bruised" and sore sensation in the center of his chest.  It seems to worsen a little bit when he takes a deep breath.  The pain does not radiate.  He has never had this pain before.  He notes that he was switched from apixaban to clopidogrel about 3 or 4 weeks ago due to cost by his PCP.  He wonders if his chest pain could be related to that.  He denies a history of heart attack.  He currently complains of persistent mild chest discomfort.  He denies fevers or chills.  In the emergency department at [facility], initial EKGs showed nonspecific ST elevation.  However, repeat EKG at 1454 showed 1 mm ST elevation in V5 and V6, prompting activation of the STEMI team.   Past Medical History:  Diagnosis Date   A-fib (HCC)    On Aspirin only   Anxiety    Cataract    removed left eye that caused retinal detachment x2   ED (erectile dysfunction)    Heart murmur    past hx per pt    Lactose intolerance    Retinal detachment    x2- left eye     Past Surgical History:  Procedure Laterality Date   APPENDECTOMY     CARDIOVERSION N/A 11/09/2020   Procedure: CARDIOVERSION;  Surgeon: Chilton Si, MD;  Location: [facility] ENDOSCOPY;  Service: Cardiovascular;  Laterality:  N/A;   CARDIOVERSION N/A 12/30/2020   Procedure: CARDIOVERSION;  Surgeon: Little Ishikawa, MD;  Location: [facility] ENDOSCOPY;  Service: Cardiovascular;  Laterality: N/A;   COLONOSCOPY     10 yr ago [place]- normal per pt    COLONOSCOPY WITH PROPOFOL N/A 09/07/2020   Procedure: COLONOSCOPY WITH PROPOFOL;  Surgeon: Beverley Fiedler, MD;  Location: [facility] ENDOSCOPY;  Service: Gastroenterology;  Laterality: N/A;   ESOPHAGOGASTRODUODENOSCOPY (EGD) WITH PROPOFOL N/A 09/05/2020   Procedure: ESOPHAGOGASTRODUODENOSCOPY (EGD) WITH PROPOFOL;  Surgeon: Beverley Fiedler, MD;  Location: [facility] ENDOSCOPY;  Service: Gastroenterology;  Laterality: N/A;   EYE SURGERY     detached retina surgery x2   GIVENS CAPSULE STUDY N/A 09/05/2020   Procedure: GIVENS CAPSULE STUDY;  Surgeon: Beverley Fiedler, MD;  Location: [facility] ENDOSCOPY;  Service: Gastroenterology;  Laterality: N/A;   GIVENS CAPSULE STUDY N/A 09/10/2020   Procedure: GIVENS CAPSULE STUDY;  Surgeon: Beverley Fiedler, MD;  Location: [facility] ENDOSCOPY;  Service: Gastroenterology;  Laterality: N/A;   LAPAROSCOPIC APPENDECTOMY N/A 08/29/2015   Procedure: APPENDECTOMY LAPAROSCOPIC;  Surgeon: Luretha Murphy, MD;  Location: [facility] ORS;  Service: General;  Laterality: N/A;   UPPER GASTROINTESTINAL ENDOSCOPY       Medications Prior to Admission: Prior to Admission medications   Medication Sig Start Date Shayleigh Bouldin Date Taking? Authorizing  Provider  acetaminophen (TYLENOL) 500 MG tablet Take 500-1,000 mg by mouth every 6 (six) hours as needed (headache/pain).    [provider]  Cholecalciferol (VITAMIN D-3) 125 MCG (5000 UT) TABS Take 5,000 Units by mouth in the morning.    [provider]  clopidogrel (PLAVIX) 75 MG tablet Take 1 tablet (75 mg total) by mouth daily. 04/02/23   Myrlene Broker, MD  ferrous sulfate ([employer]S IRON) 325 (65 FE) MG tablet Take 1 tablet (325 mg total) by mouth 2 (two) times daily with a meal. 09/23/20   Meryl Dare, MD  hydrocortisone cream 0.5 % Apply 1  application topically as needed for itching (hemorrhoids/discomfort).    [provider]  melatonin 5 MG TABS Take 5 mg by mouth at bedtime.    [provider]  propranolol (INDERAL) 20 MG tablet Take 1 tablet (20 mg total) by mouth 2 (two) times daily as needed (for performance-induced stress). Patient not taking: Reported on 10/08/2022 11/17/21   Myrlene Broker, MD  selenium 200 MCG TABS tablet Take 200 mcg by mouth in the morning.    [provider]  sildenafil (VIAGRA) 100 MG tablet Take 1 tablet (100 mg total) by mouth daily as needed for erectile dysfunction. 12/28/21   Myrlene Broker, MD  Spirulina 500 MG TABS Take 500 mg by mouth in the morning.    [provider]  triamcinolone cream (KENALOG) 0.1 % Apply 1 Application topically 2 (two) times daily. 02/12/22   Myrlene Broker, MD  vardenafil (LEVITRA) 20 MG tablet Take 0.5-1 tablets (10-20 mg total) by mouth daily as needed for erectile dysfunction. 12/28/21   Myrlene Broker, MD  zinc gluconate 50 MG tablet Take 50 mg by mouth every other day. In the morning    [provider]     Allergies:   No Known Allergies  Social History:   Social History   Socioeconomic History   Marital status: Single    Spouse name: Not on file   Number of children: 0   Years of education: Not on file   Highest education level: Not on file  Occupational History   Occupation: classical Adult nurse  Tobacco Use   Smoking status: Never   Smokeless tobacco: Never  Vaping Use   Vaping status: Never Used  Substance and Sexual Activity   Alcohol use: Yes    Comment: occ   Drug use: Never   Sexual activity: Yes  Other Topics Concern   Not on file  Social History Narrative   Not on file   Social Determinants of Health   Financial Resource Strain: Not on file  Food Insecurity: Not on file  Transportation Needs: Not on file  Physical Activity: Not on file  Stress: Not on file  Social  Connections: Not on file  Intimate Partner Violence: Not on file    Family History: The patient's family history includes Brain cancer in his father; Cancer in his father and mother; Pancreatic cancer in his mother; Seizures in his father. There is no history of Colon cancer, Esophageal cancer, Rectal cancer, Colon polyps, or Stomach cancer.    ROS:  Review of Systems  Unable to perform ROS: Acuity of condition    Physical Exam/Data:   Vitals:   04/20/23 1312 04/20/23 1313 04/20/23 1400  BP: 106/70  105/74  Pulse: 90  80  Resp: 16  19  Temp: 97.8 F (36.6 C)    SpO2: 99%  100%  Weight:  81.6 kg   Height:  6\' 6"  (1.981 m)    No intake or output data in the 24 hours ending 04/20/23 1552    04/20/2023    1:13 PM 04/02/2023    7:59 AM 10/08/2022    2:34 PM  Last 3 Weights  Weight (lbs) 180 lb 199 lb 200 lb  Weight (kg) 81.647 kg 90.266 kg 90.719 kg     Body mass index is 20.8 kg/m.  General:  Well nourished, well developed, in no acute distress HEENT: normal Neck: no JVD Vascular: No carotid bruits; Distal pulses 2+ bilaterally   Cardiac: Irregularly irregular rhythm without murmurs, rubs, or gallops. Lungs:  clear to auscultation bilaterally, no wheezing, rhonchi or rales  Abd: soft, nontender, no hepatomegaly  Ext: no lower extremity edema Musculoskeletal:  No deformities, BUE and BLE strength normal and equal Skin: warm and dry  Neuro: No focal deficits. Psych:  Normal affect   EKG:  The ECG that was done today at 1454 was personally reviewed and demonstrates atrial fibrillation with left axis deviation and 1 mm ST elevation in V5 and V6.  Relevant CV Studies: TTE (11/03/2020):  1. Left ventricular ejection fraction, by estimation, is 50 to 55%. The  left ventricle has low normal function. The left ventricle has no regional  wall motion abnormalities. There is mild left ventricular hypertrophy.  Left ventricular diastolic  parameters are indeterminate.   2. Right  ventricular systolic function is mildly reduced. The right  ventricular size is normal. There is normal pulmonary artery systolic  pressure. The estimated right ventricular systolic pressure is 29.4 mmHg.   3. Left atrial size was severely dilated.   4. Right atrial size was severely dilated.   5. The mitral valve is normal in structure. Moderate mitral valve  regurgitation. Central MR jet. Suspect atrial functional MR in setting of  severe LA dilatation from Afib   6. The aortic valve is tricuspid. Aortic valve regurgitation is not  visualized. No aortic stenosis is present.   7. Aortic dilatation noted. There is dilatation of the ascending aorta,  measuring 40 mm.   8. The inferior vena cava is normal in size with greater than 50%  respiratory variability, suggesting right atrial pressure of 3 mmHg.   Laboratory Data:  High Sensitivity Troponin:   Recent Labs  Lab 04/20/23 1338 04/20/23 1513  TROPONINIHS 28* 29*      Chemistry Recent Labs  Lab 04/20/23 1338  NA 135  K 3.6  CL 103  CO2 24  GLUCOSE 97  BUN 23  CREATININE 0.91  CALCIUM 9.1  GFRNONAA >60  ANIONGAP 8    Recent Labs  Lab 04/20/23 1415  PROT 6.7  ALBUMIN 3.8  AST 64*  ALT 56*  ALKPHOS 65  BILITOT 1.7*   Lipids  Recent Labs  Lab 04/20/23 1507  CHOL 120  TRIG 38  HDL 38*  LDLCALC 74  CHOLHDL 3.2   Hematology Recent Labs  Lab 04/20/23 1338  WBC 8.6  RBC 4.48  HGB 14.1  HCT 42.0  MCV 93.8  MCH 31.5  MCHC 33.6  RDW 13.5  PLT 228   Thyroid No results for input(s): "TSH", "FREET4" in the last 168 hours. BNPNo results for input(s): "BNP", "PROBNP" in the last 168 hours.  DDimer  Recent Labs  Lab 04/20/23 1415  DDIMER <0.27     Radiology/Studies:  DG Chest Portable 1 View  Result Date: 04/20/2023 CLINICAL DATA:  Chest pain EXAM:  PORTABLE CHEST 1 VIEW COMPARISON:  None Available. FINDINGS: Cardiomegaly. Both lungs are clear. The visualized skeletal structures are unremarkable.  IMPRESSION: Cardiomegaly without acute abnormality of the lungs in AP portable projection. Electronically Signed   By: Jearld Lesch M.D.   On: 04/20/2023 13:47     Assessment and Plan:   Lateral STEMI: Patient presents with sudden onset of chest pain after vigorous exercise yesterday.  The pain has improved, he continues to have some vague soreness in the center of his chest.  EKGs in the [facility] long ED showed dynamic ST changes, ultimately with 1 mm of ST elevation in the lateral precordial leads.  Initial troponin was also mildly elevated at 28.  We have agreed proceed with emergent cardiac catheterization and possible PCI.  Mr. Evelena Asa has provided emergent verbal consent.  Persistent atrial fibrillation: Mr. Evelena Asa is in atrial fibrillation again today with adequate rate control.  He was recently transition from apixaban to clopidogrel in the setting of a CHA2DS2-VASc score of 1 (age).  However, if CAD is confirmed on catheterization, his CHA2DS2-VASc score will increase to 2 and anticoagulation with DOAC will need to be reinitiated prior to discharge.  Further recommendations to follow catheterization.  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  Second TnI flat at 29.  Query if the patient's presentation represent acute myopericarditis or more chronic cardiomyopathy with coincidental noncardiac chest pain.  I performed a limited bedside echo that did not show a significant pericardial effusion.  I have started metoprolol succinate 12.5 mg daily and colchicine 0.6 mg BID.  ESR, CRP, TSH, and BNP also ordered.  Anticipate resumption of apixaban 5 mg BID as soon as tomorrow if no evidence of bleeding or vascular injury, as CHADSVASC score is now at least 2 (age and CHF).  The patient also mentions a recent dry cough; will swab for COVID-19 as well.   Risk Assessment/Risk Scores:    TIMI Risk Score for ST   Elevation MI:   The patient's TIMI risk score is 3, which indicates a 4.4% risk of all cause mortality at 30 days.     CHA2DS2-VASc Score = 1   This indicates a 0.6% annual risk of stroke. The patient's score is based upon: CHF History: 0 HTN History: 0 Diabetes History: 0 Stroke History: 0 Vascular Disease History: 0 Age Score: 1 Gender Score: 0      Code Status: Full Code  Severity of Illness: The appropriate patient status for this patient is INPATIENT. Inpatient status is judged to be reasonable and necessary in order to provide the required intensity of service to ensure the patient's safety. The patient's presenting symptoms, physical exam findings, and initial radiographic and laboratory data in the context of their chronic comorbidities is felt to place them at high risk for further clinical deterioration. Furthermore, it is not anticipated that the patient will be medically stable for discharge from the hospital within 2 midnights of admission.   * I certify that at the point of admission it is my clinical judgment that the patient will require inpatient hospital care spanning beyond 2 midnights from the point of admission due to high intensity of service, high risk for further deterioration and high frequency of surveillance required.*   For questions or updates, please contact [facility] Please consult [email or web address] for contact info under     Signed, Yvonne Kendall, MD  04/20/2023 3:52 PM

## 2023-04-20 NOTE — ED Triage Notes (Signed)
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 Says it feels like his chest is bruised. Denies shobr/sweating/nausea/numbness/tingling  Previously on eliquis , stopped due to cost, was started plavix (3 weeks ago) H/o AFIB

## 2023-04-20 NOTE — ED Provider Notes (Signed)
[facility] AT [facility] Provider Note   CSN: [id number] Arrival date & time: 04/20/23  1307     History  Chief Complaint  Patient presents with   Chest Pain    Jose Holden is a 67 y.o. male.  HPI   67 year old male with past medical history of atrial fibrillation, no longer on Eliquis as of 3 weeks ago (due to cost, currently taking Plavix) presents emergency department with midsternal chest pressure.  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  When he got there he was running late and had to run back and forth between car and facility.  He states that the cold air created a burning sensation in his chest.  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  But when getting home last night and laying in bed he felt ongoing chest pressure and discomfort.  He states that it is mildly ongoing at this time, about a 1/10.  He states he feels like his chest is bruised.  Last night pain seemed pleuritic but currently he denies any shortness of breath, productive cough, swelling of his lower extremities, headache or neurosymptoms.  Home Medications Prior to Admission medications   Medication Sig Start Date End Date Taking? Authorizing Provider  acetaminophen (TYLENOL) 500 MG tablet Take 500-1,000 mg by mouth every 6 (six) hours as needed (headache/pain).    [provider]  Cholecalciferol (VITAMIN D-3) 125 MCG (5000 UT) TABS Take 5,000 Units by mouth in the morning.    [provider]  clopidogrel (PLAVIX) 75 MG tablet Take 1 tablet (75 mg total) by mouth daily. 04/02/23   Myrlene Broker, MD  ferrous sulfate ([employer] IRON) 325 (65 FE) MG tablet Take 1 tablet (325 mg total) by mouth 2 (two) times daily with a meal. 09/23/20   Meryl Dare, MD  hydrocortisone cream 0.5 % Apply 1 application topically as needed for itching (hemorrhoids/discomfort).    [provider]   melatonin 5 MG TABS Take 5 mg by mouth at bedtime.    [provider]  propranolol (INDERAL) 20 MG tablet Take 1 tablet (20 mg total) by mouth 2 (two) times daily as needed (for performance-induced stress). Patient not taking: Reported on 10/08/2022 11/17/21   Myrlene Broker, MD  selenium 200 MCG TABS tablet Take 200 mcg by mouth in the morning.    [provider]  sildenafil (VIAGRA) 100 MG tablet Take 1 tablet (100 mg total) by mouth daily as needed for erectile dysfunction. 12/28/21   Myrlene Broker, MD  Spirulina 500 MG TABS Take 500 mg by mouth in the morning.    [provider]  triamcinolone cream (KENALOG) 0.1 % Apply 1 Application topically 2 (two) times daily. 02/12/22   Myrlene Broker, MD  vardenafil (LEVITRA) 20 MG tablet Take 0.5-1 tablets (10-20 mg total) by mouth daily as needed for erectile dysfunction. 12/28/21   Myrlene Broker, MD  zinc gluconate 50 MG tablet Take 50 mg by mouth every other day. In the morning    [provider]      Allergies    Patient has no known allergies.    Review of Systems   Review of Systems  Constitutional:  Positive for fatigue. Negative for fever.  Respiratory:  Positive for chest tightness. Negative for shortness of breath.   Cardiovascular:  Negative for chest pain, palpitations and leg swelling.  Gastrointestinal:  Negative for abdominal pain, diarrhea and vomiting.  Genitourinary:  Negative for flank pain.  Musculoskeletal:  Negative for back pain.  Skin:  Negative for rash.  Neurological:  Negative for headaches.    Physical Exam Updated Vital Signs BP 106/70 (BP Location: Right Arm)   Pulse 90   Temp 97.8 F (36.6 C)   Resp 16   Ht 6\' 6"  (1.981 m)   Wt 81.6 kg   SpO2 99%   BMI 20.80 kg/m  Physical Exam Vitals and nursing note reviewed.  Constitutional:      General: He is not in acute distress.    Appearance: Normal appearance.  HENT:     Head: Normocephalic.      Mouth/Throat:     Mouth: Mucous membranes are moist.  Cardiovascular:     Rate and Rhythm: Normal rate. Rhythm irregular.  Pulmonary:     Effort: Pulmonary effort is normal. No respiratory distress.  Abdominal:     Palpations: Abdomen is soft.     Tenderness: There is no abdominal tenderness.  Musculoskeletal:     Right lower leg: No edema.     Left lower leg: No edema.  Skin:    General: Skin is warm.  Neurological:     Mental Status: He is alert and oriented to person, place, and time. Mental status is at baseline.  Psychiatric:        Mood and Affect: Mood normal.     ED Results / Procedures / Treatments   Labs (all labs ordered are listed, but only abnormal results are displayed) Labs Reviewed  CBC  BASIC METABOLIC PANEL  HEPATIC FUNCTION PANEL  D-DIMER, QUANTITATIVE  TROPONIN I (HIGH SENSITIVITY)    EKG EKG Interpretation Date/Time:  Saturday April 20 2023 13:12:34 EST Ventricular Rate:  89 PR Interval:    QRS Duration:  102 QT Interval:  364 QTC Calculation: 443 R Axis:   -81  Text Interpretation: Atrial fibrillation Left anterior fascicular block Lateral infarct, acute (LAD) ST elevation, consider inferior injury >>> Acute MI <<< no STEMI , irregular Confirmed by Coralee Pesa [phone]) on 04/20/2023 1:31:50 PM  Radiology DG Chest Portable 1 View  Result Date: 04/20/2023 CLINICAL DATA:  Chest pain EXAM: PORTABLE CHEST 1 VIEW COMPARISON:  None Available. FINDINGS: Cardiomegaly. Both lungs are clear. The visualized skeletal structures are unremarkable. IMPRESSION: Cardiomegaly without acute abnormality of the lungs in AP portable projection. Electronically Signed   By: Jearld Lesch M.D.   On: 04/20/2023 13:47    Procedures .Critical Care  Performed by: Rozelle Logan, DO Authorized by: Rozelle Logan, DO   Critical care provider statement:    Critical care time (minutes):  75   Critical care time was exclusive of:  Separately billable  procedures and treating other patients   Critical care was necessary to treat or prevent imminent or life-threatening deterioration of the following conditions:  Circulatory failure   Critical care was time spent personally by me on the following activities:  Development of treatment plan with patient or surrogate, discussions with consultants, evaluation of patient's response to treatment, examination of patient, ordering and review of laboratory studies, ordering and review of radiographic studies, ordering and performing treatments and interventions, pulse oximetry, re-evaluation of patient's condition and review of old charts   I assumed direction of critical care for this patient from another provider in my specialty: no     Care discussed with: admitting provider       Medications  Ordered in ED Medications - No data to display  ED Course/ Medical Decision Making/ A&P                                 Medical Decision Making Amount and/or Complexity of Data Reviewed Labs: ordered. Radiology: ordered.  Risk OTC drugs. Prescription drug management.   67 year old male presents emergency department with left-sided chest pressure.  This started last night around 7 PM after he was doing vigorous activity outside.  He states it feels acute punched in the chest, sore on the left side.  At 1 point pleuritic but currently no shortness of breath.  History of A-fib, no longer anticoagulated on Eliquis, takes daily Plavix.  Took a dose this morning.  Arrival EKG is reading as STEMI however disagree.  There is no full STEMI criteria on his EKG, otherwise showing atrial fibrillation.  Chest discomfort is ongoing at 1/10.  Chest x-ray shows cardiomegaly without acute findings.  Blood work shows mild transaminitis, negative D-dimer.  First troponin is 28.  Repeat EKG shows atrial fibrillation but there is more identifiable ST elevation in the lateral leads.  For this reason STEMI cardiologist, Dr. Okey Dupre  was contacted.  We reviewed the EKGs. Dr. Okey Dupre agrees arrival EKG without STEMI criteria. However on repeat with the lateral ST elevations being just about 1 mm, with the ongoing discomfort and troponin is 28 we decided to activate code STEMI.  This was ordered, aspirin, nitroglycerin and heparin ordered as well.  Patient and family updated.  Patient feels like the discomfort is almost resolved with still ongoing.  Patient will be transferred emergently as a code STEMI, Dr. Okey Dupre excepting.  Patient stable at time of transfer.        Final Clinical Impression(s) / ED Diagnoses Final diagnoses:  None    Rx / DC Orders ED Discharge Orders     None         Rozelle Logan, DO 04/20/23 1516

## 2023-04-21 ENCOUNTER — Inpatient Hospital Stay (HOSPITAL_COMMUNITY): Payer: Medicare HMO

## 2023-04-21 ENCOUNTER — Inpatient Hospital Stay (HOSPITAL_BASED_OUTPATIENT_CLINIC_OR_DEPARTMENT_OTHER): Payer: Medicare HMO

## 2023-04-21 DIAGNOSIS — R079 Chest pain, unspecified: Secondary | ICD-10-CM

## 2023-04-21 DIAGNOSIS — I2510 Atherosclerotic heart disease of native coronary artery without angina pectoris: Secondary | ICD-10-CM | POA: Diagnosis not present

## 2023-04-21 DIAGNOSIS — I319 Disease of pericardium, unspecified: Secondary | ICD-10-CM | POA: Diagnosis not present

## 2023-04-21 DIAGNOSIS — I482 Chronic atrial fibrillation, unspecified: Secondary | ICD-10-CM | POA: Diagnosis not present

## 2023-04-21 DIAGNOSIS — Z79899 Other long term (current) drug therapy: Secondary | ICD-10-CM | POA: Diagnosis not present

## 2023-04-21 LAB — BASIC METABOLIC PANEL
Anion gap: 8 (ref 5–15)
BUN: 18 mg/dL (ref 8–23)
CO2: 21 mmol/L — ABNORMAL LOW (ref 22–32)
Calcium: 8.7 mg/dL — ABNORMAL LOW (ref 8.9–10.3)
Chloride: 108 mmol/L (ref 98–111)
Creatinine, Ser: 0.82 mg/dL (ref 0.61–1.24)
GFR, Estimated: >60 mL/min (ref >60)
Glucose, Bld: 111 mg/dL — ABNORMAL HIGH (ref 70–99)
Potassium: 3.8 mmol/L (ref 3.5–5.1)
Sodium: 137 mmol/L (ref 135–145)

## 2023-04-21 LAB — ECHOCARDIOGRAM COMPLETE
Area-P 1/2: 3.85 cm2
Calc EF: 44.1 %
Height: 78 in
MV M vel: 4.35 m/s
MV Peak grad: 75.5 mm[Hg]
S' Lateral: 4.4 cm
Single Plane A2C EF: 45 %
Single Plane A4C EF: 37.9 %
Weight: 2880 [oz_av]

## 2023-04-21 LAB — CBC
HCT: 38.6 % — ABNORMAL LOW (ref 39.0–52.0)
Hemoglobin: 13 g/dL (ref 13.0–17.0)
MCH: 30.9 pg (ref 26.0–34.0)
MCHC: 33.7 g/dL (ref 30.0–36.0)
MCV: 91.7 fL (ref 80.0–100.0)
Platelets: 191 10*3/uL (ref 150–400)
RBC: 4.21 MIL/uL — ABNORMAL LOW (ref 4.22–5.81)
RDW: 13.7 % (ref 11.5–15.5)
WBC: 5.9 10*3/uL (ref 4.0–10.5)
nRBC: 0 % (ref 0.0–0.2)

## 2023-04-21 MED ORDER — ZOLPIDEM TARTRATE 5 MG PO TABS
5.0000 mg | ORAL_TABLET | Freq: Every evening | ORAL | Status: DC | PRN
  Administered 2023-04-21: 5 mg via ORAL
  Filled 2023-04-21: qty 1

## 2023-04-21 MED ORDER — APIXABAN 5 MG PO TABS: 5.0000 mg | ORAL_TABLET | Freq: Two times a day (BID) | ORAL | Status: DC

## 2023-04-21 MED ORDER — CHLORHEXIDINE GLUCONATE CLOTH 2 % EX PADS
6.0000 | MEDICATED_PAD | Freq: Every day | CUTANEOUS | Status: DC
  Administered 2023-04-21: 6 via TOPICAL

## 2023-04-21 MED ORDER — METOPROLOL SUCCINATE ER 25 MG PO TB24: 12.5000 mg | ORAL_TABLET | Freq: Every day | ORAL | 1 refills | Status: DC

## 2023-04-21 MED ORDER — COLCHICINE 0.6 MG PO TABS: 0.6000 mg | ORAL_TABLET | Freq: Two times a day (BID) | ORAL | 2 refills | Status: DC

## 2023-04-21 MED ORDER — APIXABAN 5 MG PO TABS: 5.0000 mg | ORAL_TABLET | Freq: Two times a day (BID) | ORAL | 11 refills | Status: DC

## 2023-04-21 NOTE — Care Management (Signed)
Provided patient with 30 day Eliquis coupon.

## 2023-04-21 NOTE — Care Management CC44 (Signed)
Condition Code 44 Documentation Completed  Patient Details  Name: Jose Holden MRN: [id number] Date of Birth: 1955-12-16   Condition Code 44 given:  Yes Patient signature on Condition Code 44 notice:  Yes Documentation of 2 MD's agreement:  Yes Code 44 added to claim:  Yes    Lawerance Sabal, RN 04/21/2023, 4:14 PM

## 2023-04-21 NOTE — Discharge Summary (Cosign Needed)
Discharge Summary    Patient ID: Jose Holden MRN: [id number]; DOB: 10/03/1955  Admit date: 04/20/2023 Discharge date: 04/21/2023  PCP:  Myrlene Broker, MD   [facility] Providers Cardiologist:  None  Electrophysiologist:  Will Jorja Loa, MD       Discharge Diagnoses    Principal Problem:   Myopericarditis Active Problems:   Atrial fibrillation, chronic (HCC)   CAD (coronary artery disease)    Diagnostic Studies/Procedures    Cath 04/20/2023 Conclusions: Nonobstructive coronary artery disease with 40% D1 and ramus intermedius stenoses as well as minimal luminal irregularities of the LAD and myocardial bridging of the distal LAD.  No angiographically significant disease noted in the LCx or RCA. Moderately reduced left ventricular systolic function with global hypokinesis (LVEF 35-45%). Mildly elevated left ventricular filling pressure (LVEDP 18 mmHg).   Recommendations: Obtain echocardiogram. If no evidence of significant pericardial effusion or bleeding/vascular injury at catheterization site, consider initiation of apixaban as soon as tomorrow. Initiate goal-directed medical therapy for nonischemic cardiomyopathy, possibly myopericarditis. Maintain net even to slightly negative fluid balance.    Echo 04/21/2023  1. Left ventricular ejection fraction, by estimation, is 40 to 45%. The  left ventricle has mildly decreased function. The left ventricle  demonstrates global hypokinesis. There is mild asymmetric left ventricular  hypertrophy. Left ventricular diastolic  function could not be evaluated.   2. Moderator band seen. Right ventricular systolic function is normal.  The right ventricular size is normal.   3. Left atrial size was mildly dilated.   4. Right atrial size was severely dilated.   5. The mitral valve is normal in structure. Moderate mitral valve  regurgitation. No evidence of mitral stenosis.   6. The aortic valve is normal  in structure. Aortic valve regurgitation is  not visualized. No aortic stenosis is present.   7. There is mild dilatation of the aortic root, measuring 38 mm. There is  mild dilatation of the ascending aorta, measuring 42 mm.   8. The inferior vena cava is normal in size with greater than 50%  respiratory variability, suggesting right atrial pressure of 3 mmHg.    _____________   History of Present Illness     Jose Holden is a 67 y.o. male with history of persistent atrial fibrillation who is being seen 04/20/2023 for the evaluation of chest pain and abnormal EKG.   Mr. Toole reports going for a vigorous run yesterday and subsequently developing pain in the center of his chest, almost as if he had been punched.  The pain has subsided somewhat, though he continues to have a "bruised" and sore sensation in the center of his chest.  It seems to worsen a little bit when he takes a deep breath.  The pain does not radiate.  He has never had this pain before.  He notes that he was switched from apixaban to clopidogrel about 3 or 4 weeks ago due to cost by his PCP.  He wonders if his chest pain could be related to that.  He denies a history of heart attack.  He currently complains of persistent mild chest discomfort.  He denies fevers or chills.   In the emergency department at [facility], initial EKGs showed nonspecific ST elevation.  However, repeat EKG at 1454 showed 1 mm ST elevation in V5 and V6, prompting activation of the STEMI team.  Hospital Course     Consultants: N/A   His initial presentation and EKG change was concerning  for lateral STEMI.  EKG does show atrial fibrillation, previous EKG from early November 2024 and the 2022 all showed atrial fibrillation.  He was taken urgently to the Cath Lab for cardiac catheterization which showed 40% D1 lesion and ramus intermedius lesion, minimal luminal irregularities of LAD, myocardial bridging of the distal LAD.  No angiographically  significant disease noted in the left circumflex or RCA, EF 35 to 45% by LV gram, LVEDP 18 mmHg.  Patient's Eliquis was recently switched to Plavix in the setting of CHA2DS2-Vasc score of 1, however with confirmed CAD on cardiac catheterization, his CHA2DS2-Vasc score is 2, it was recommended to restart Eliquis.  Limited bedside echocardiogram did not show significant pericardial effusion.  He was seen on 04/17/2023 at which time he was doing well.  He was started on colchicine 0.6 mg twice a day.  Metoprolol 12.5 mg daily was also started.  Complete echocardiogram obtained on 04/17/2023 demonstrated EF 40 to 45%, normal RV, severe right atrial enlargement, mild left atrial enlargement, moderate MR, mildly dilated ascending aorta measuring at 4.2 cm.  He was placed on minimal dose of metoprolol succinate, medication was unable to be titrated due to borderline blood pressure.  He was also placed on 80-month course of colchicine for possible myopericarditis.  Eliquis resumed upon discharge.  Plavix has been stopped.  Patient will follow-up with cardiology service in 2 to 3 weeks as outpatient. His GDMT should be titrated further as outpatient.       Did the patient have an acute coronary syndrome (MI, NSTEMI, STEMI, etc) this admission?:  No.   The elevated Troponin was due to the acute medical illness (demand ischemia).         _____________  Discharge Vitals Blood pressure 101/77, pulse 78, temperature 98 F (36.7 C), resp. rate 18, height 6\' 6"  (1.981 m), weight 81.6 kg, SpO2 96%.  Filed Weights   04/20/23 1313  Weight: 81.6 kg    Labs & Radiologic Studies    CBC Recent Labs    04/20/23 1338 04/21/23 0245  WBC 8.6 5.9  HGB 14.1 13.0  HCT 42.0 38.6*  MCV 93.8 91.7  PLT 228 191   Basic Metabolic Panel Recent Labs    16/10/96 1338 04/21/23 0245  NA 135 137  K 3.6 3.8  CL 103 108  CO2 24 21*  GLUCOSE 97 111*  BUN 23 18  CREATININE 0.91 0.82  CALCIUM 9.1 8.7*   Liver  Function Tests Recent Labs    04/20/23 1415  AST 64*  ALT 56*  ALKPHOS 65  BILITOT 1.7*  PROT 6.7  ALBUMIN 3.8   No results for input(s): "LIPASE", "AMYLASE" in the last 72 hours. High Sensitivity Troponin:   Recent Labs  Lab 04/20/23 1338 04/20/23 1513  TROPONINIHS 28* 29*    BNP Invalid input(s): "POCBNP" D-Dimer Recent Labs    04/20/23 1415  DDIMER <0.27   Hemoglobin A1C Recent Labs    04/20/23 1723  HGBA1C 5.7*   Fasting Lipid Panel Recent Labs    04/20/23 1507  CHOL 120  HDL 38*  LDLCALC 74  TRIG 38  CHOLHDL 3.2   Thyroid Function Tests Recent Labs    04/20/23 1723  TSH 2.509   _____________  ECHOCARDIOGRAM COMPLETE  Result Date: 04/21/2023    ECHOCARDIOGRAM REPORT   Patient Name:   Adelaido LEE Grabbe Date of Exam: 04/21/2023 Medical Rec #:  [id number]        Height:  78.0 in Accession #:    [id number]       Weight:       180.0 lb Date of Birth:  Jan 07, 1956        BSA:          2.159 m Patient Age:    67 years         BP:           104/76 mmHg Patient Gender: M                HR:           87 bpm. Exam Location:  Inpatient Procedure: 2D Echo, Cardiac Doppler and Color Doppler Indications:    Chest Pain R07.9  History:        Patient has prior history of Echocardiogram examinations, most                 recent 11/03/2020. Arrythmias:Atrial Fibrillation.  Sonographer:    Lucendia Herrlich RCS Referring Phys: [phone] CHRISTOPHER END IMPRESSIONS  1. Left ventricular ejection fraction, by estimation, is 40 to 45%. The left ventricle has mildly decreased function. The left ventricle demonstrates global hypokinesis. There is mild asymmetric left ventricular hypertrophy. Left ventricular diastolic function could not be evaluated.  2. Moderator band seen. Right ventricular systolic function is normal. The right ventricular size is normal.  3. Left atrial size was mildly dilated.  4. Right atrial size was severely dilated.  5. The mitral valve is normal in structure.  Moderate mitral valve regurgitation. No evidence of mitral stenosis.  6. The aortic valve is normal in structure. Aortic valve regurgitation is not visualized. No aortic stenosis is present.  7. There is mild dilatation of the aortic root, measuring 38 mm. There is mild dilatation of the ascending aorta, measuring 42 mm.  8. The inferior vena cava is normal in size with greater than 50% respiratory variability, suggesting right atrial pressure of 3 mmHg. FINDINGS  Left Ventricle: Left ventricular ejection fraction, by estimation, is 40 to 45%. The left ventricle has mildly decreased function. The left ventricle demonstrates global hypokinesis. The left ventricular internal cavity size was normal in size. There is  mild asymmetric left ventricular hypertrophy. Left ventricular diastolic function could not be evaluated due to atrial fibrillation. Left ventricular diastolic function could not be evaluated. Right Ventricle: Moderator band seen. The right ventricular size is normal. No increase in right ventricular wall thickness. Right ventricular systolic function is normal. Left Atrium: Left atrial size was mildly dilated. Right Atrium: Right atrial size was severely dilated. Pericardium: There is no evidence of pericardial effusion. Mitral Valve: The mitral valve is normal in structure. Moderate mitral valve regurgitation. No evidence of mitral valve stenosis. Tricuspid Valve: The tricuspid valve is normal in structure. Tricuspid valve regurgitation is trivial. No evidence of tricuspid stenosis. Aortic Valve: The aortic valve is normal in structure. Aortic valve regurgitation is not visualized. No aortic stenosis is present. Pulmonic Valve: The pulmonic valve was not well visualized. Pulmonic valve regurgitation is trivial. No evidence of pulmonic stenosis. Aorta: There is mild dilatation of the aortic root, measuring 38 mm. There is mild dilatation of the ascending aorta, measuring 42 mm. Venous: The inferior vena  cava is normal in size with greater than 50% respiratory variability, suggesting right atrial pressure of 3 mmHg. IAS/Shunts: No atrial level shunt detected by color flow Doppler.  LEFT VENTRICLE PLAX 2D LVIDd:         5.40 cm  Diastology LVIDs:         4.40 cm      LV e' medial:    13.30 cm/s LV PW:         1.10 cm      LV E/e' medial:  5.6 LV IVS:        0.90 cm      LV e' lateral:   21.00 cm/s LVOT diam:     2.30 cm      LV E/e' lateral: 3.6 LV SV:         46 LV SV Index:   21 LVOT Area:     4.15 cm  LV Volumes (MOD) LV vol d, MOD A2C: 173.0 ml LV vol d, MOD A4C: 111.0 ml LV vol s, MOD A2C: 95.1 ml LV vol s, MOD A4C: 68.9 ml LV SV MOD A2C:     77.9 ml LV SV MOD A4C:     111.0 ml LV SV MOD BP:      67.0 ml RIGHT VENTRICLE             IVC RV S prime:     10.20 cm/s  IVC diam: 2.20 cm TAPSE (M-mode): 1.3 cm LEFT ATRIUM           Index        RIGHT ATRIUM           Index LA diam:      5.10 cm 2.36 cm/m   RA Area:     33.50 cm LA Vol (A2C): 82.2 ml 38.08 ml/m  RA Volume:   119.00 ml 55.13 ml/m LA Vol (A4C): 79.3 ml 36.74 ml/m  AORTIC VALVE LVOT Vmax:   70.08 cm/s LVOT Vmean:  44.125 cm/s LVOT VTI:    0.111 m  AORTA Ao Root diam: 3.80 cm Ao Asc diam:  4.20 cm MITRAL VALVE               TRICUSPID VALVE MV Area (PHT): 3.85 cm    TR Peak grad:   24.6 mmHg MV Decel Time: 197 msec    TR Vmax:        248.00 cm/s MR Peak grad: 75.5 mmHg MR Vmax:      434.50 cm/s  SHUNTS MV E velocity: 74.90 cm/s  Systemic VTI:  0.11 m MV A velocity: 50.80 cm/s  Systemic Diam: 2.30 cm MV E/A ratio:  1.47 Kardie Tobb DO Electronically signed by Thomasene Ripple DO Signature Date/Time: 04/21/2023/2:35:05 PM    Final    CARDIAC CATHETERIZATION  Result Date: 04/20/2023 Conclusions: Nonobstructive coronary artery disease with 40% D1 and ramus intermedius stenoses as well as minimal luminal irregularities of the LAD and myocardial bridging of the distal LAD.  No angiographically significant disease noted in the LCx or RCA. Moderately  reduced left ventricular systolic function with global hypokinesis (LVEF 35-45%). Mildly elevated left ventricular filling pressure (LVEDP 18 mmHg). Recommendations: Obtain echocardiogram. If no evidence of significant pericardial effusion or bleeding/vascular injury at catheterization site, consider initiation of apixaban as soon as tomorrow. Initiate goal-directed medical therapy for nonischemic cardiomyopathy, possibly myopericarditis. Maintain net even to slightly negative fluid balance. Yvonne Kendall, MD [facility]   DG Chest Portable 1 View  Result Date: 04/20/2023 CLINICAL DATA:  Chest pain EXAM: PORTABLE CHEST 1 VIEW COMPARISON:  None Available. FINDINGS: Cardiomegaly. Both lungs are clear. The visualized skeletal structures are unremarkable. IMPRESSION: Cardiomegaly without acute abnormality of the lungs in AP portable projection. Electronically Signed   By: Trinna Post  Jane Canary M.D.   On: 04/20/2023 13:47   Disposition   Pt is being discharged home today in good condition.  Follow-up Plans & Appointments     Follow-up Information     Regan Lemming, MD Follow up.   Specialty: Cardiology Why: cardiology scheduler will contact you to arrange outpatient follow up, please contact our office if you do not hear from [place] in 3 bussiness days Contact information: [address] [place] [address] [phone]                Discharge Instructions     Diet - low sodium heart healthy   Complete by: As directed    Discharge instructions   Complete by: As directed    No driving for 48 hours. No lifting over 5 lbs for 1 week. No sexual activity for 1 week. Keep procedure site clean & dry. If you notice increased pain, swelling, bleeding or pus, call/return!  You may shower, but no soaking baths/hot tubs/pools for 1 week.   Increase activity slowly   Complete by: As directed         Discharge Medications   Allergies as of 04/21/2023   No Known Allergies       Medication List     STOP taking these medications    clopidogrel 75 MG tablet Commonly known as: PLAVIX   propranolol 20 MG tablet Commonly known as: INDERAL       TAKE these medications    acetaminophen 500 MG tablet Commonly known as: TYLENOL Take 500 mg by mouth every 6 (six) hours as needed for mild pain (pain score 1-3).   apixaban 5 MG Tabs tablet Commonly known as: ELIQUIS Take 1 tablet (5 mg total) by mouth 2 (two) times daily.   colchicine 0.6 MG tablet Take 1 tablet (0.6 mg total) by mouth 2 (two) times daily.   ferrous sulfate 325 (65 FE) MG tablet Commonly known as: [facility] Iron Take 1 tablet (325 mg total) by mouth 2 (two) times daily with a meal.   melatonin 5 MG Tabs Take 5 mg by mouth at bedtime.   metoprolol succinate 25 MG 24 hr tablet Commonly known as: TOPROL-XL Take 0.5 tablets (12.5 mg total) by mouth daily. Start taking on: April 22, 2023   selenium 200 MCG Tabs tablet Take 200 mcg by mouth in the morning.   sildenafil 100 MG tablet Commonly known as: VIAGRA Take 1 tablet (100 mg total) by mouth daily as needed for erectile dysfunction.   Spirulina 500 MG Tabs Take 500 mg by mouth in the morning.   triamcinolone cream 0.1 % Commonly known as: KENALOG Apply 1 Application topically 2 (two) times daily. What changed:  when to take this reasons to take this   vardenafil 20 MG tablet Commonly known as: LEVITRA Take 0.5-1 tablets (10-20 mg total) by mouth daily as needed for erectile dysfunction.   Vitamin D-3 125 MCG (5000 UT) Tabs Take 5,000 Units by mouth in the morning.   zinc gluconate 50 MG tablet Take 50 mg by mouth every other day. In the morning           Outstanding Labs/Studies   N/A  Duration of Discharge Encounter   Greater than 30 minutes including physician time.  Ramond Dial, PA 04/21/2023, 3:41 PM   Personally seen and examined. Agree with above.  Laying comfortably flat in bed.  Chest  pain has resolved. Plays and teaches cello.  Vital Signs .           Vitals:    04/21/23 0500 04/21/23 0600 04/21/23 0700 04/21/23 0800  BP: 96/72 112/87 98/67 93/68   Pulse: 74 70 70 75  Resp: (!) 22 19 (!) 21 (!) 22  Temp:          TempSrc:          SpO2: 93% 93% 92% 93%  Weight:          Height:              Intake/Output Summary (Last 24 hours) at 04/21/2023 0926 Last data filed at 04/21/2023 0800    Gross per 24 hour  Intake 240 ml  Output 1875 ml  Net -1635 ml        04/20/2023    1:13 PM 04/02/2023    7:59 AM 10/08/2022    2:34 PM  Last 3 Weights  Weight (lbs) 180 lb 199 lb 200 lb  Weight (kg) 81.647 kg 90.266 kg 90.719 kg       Telemetry/ECG    Atrial fibrillation heart rate in the 70s- Personally Reviewed   Physical Exam .   GEN: No acute distress.   Neck: No JVD Cardiac: Irregularly irregular, no murmurs, rubs, or gallops.  Respiratory: Clear to auscultation bilaterally. GI: Soft, nontender, non-distended  MS: No edema   Assessment & Plan .     67 year old with longstanding persistent atrial fibrillation (Dr. Elberta Fortis) here with chest discomfort subtle ST elevation in V5 V6, catheterization with nonobstructive CAD, EF approximately 40%, LVEDP 18 mmHg   Chest pain/elevated troponin 28/nonobstructive CAD/acute systolic heart failure/cardiomyopathy - No evidence of arterial occlusion.  Cardiac catheterization reviewed. -Possible myopericarditis -CHADSVASc or now is 2 given nonobstructive CAD, recommend Eliquis.  Challenging for him to afford Eliquis.Patient assistance forms have been given to him my pharmacy team in house.  -Metoprolol low-dose 12.5 mg XL daily has been started.  Blood pressures have been fairly soft at 93/68 last check.  We will hold off at this time on other goal-directed medical therapy such as Entresto, spironolactone, Jardiance. -Colchicine 0.6 mg twice a day has been started   Diagnostic Dominance: Right     ECHO  1. Left  ventricular ejection fraction, by estimation, is 40 to 45%. The  left ventricle has mildly decreased function. The left ventricle  demonstrates global hypokinesis. There is mild asymmetric left ventricular  hypertrophy. Left ventricular diastolic  function could not be evaluated.   2. Moderator band seen. Right ventricular systolic function is normal.  The right ventricular size is normal.   3. Left atrial size was mildly dilated.   4. Right atrial size was severely dilated.   5. The mitral valve is normal in structure. Moderate mitral valve  regurgitation. No evidence of mitral stenosis.   6. The aortic valve is normal in structure. Aortic valve regurgitation is  not visualized. No aortic stenosis is present.   7. There is mild dilatation of the aortic root, measuring 38 mm. There is  mild dilatation of the ascending aorta, measuring 42 mm.   8. The inferior vena cava is normal in size with greater than 50%  respiratory variability, suggesting right atrial pressure of 3 mmHg.    Based upon this echocardiogram, I am comfortable with his discharge home with close follow-up and outpatient up titration of goal-directed medical therapy.   Donato Schultz, MD

## 2023-04-21 NOTE — Progress Notes (Addendum)
Patient Name: Jose Holden Date of Encounter: 04/21/2023 [facility] Cardiologist: None   Interval Summary  .    Laying comfortably flat in bed.  Chest pain has resolved  Vital Signs .    Vitals:   04/21/23 0500 04/21/23 0600 04/21/23 0700 04/21/23 0800  BP: 96/72 112/87 98/67 93/68   Pulse: 74 70 70 75  Resp: (!) 22 19 (!) 21 (!) 22  Temp:      TempSrc:      SpO2: 93% 93% 92% 93%  Weight:      Height:        Intake/Output Summary (Last 24 hours) at 04/21/2023 0926 Last data filed at 04/21/2023 0800 Gross per 24 hour  Intake 240 ml  Output 1875 ml  Net -1635 ml      04/20/2023    1:13 PM 04/02/2023    7:59 AM 10/08/2022    2:34 PM  Last 3 Weights  Weight (lbs) 180 lb 199 lb 200 lb  Weight (kg) 81.647 kg 90.266 kg 90.719 kg      Telemetry/ECG    Atrial fibrillation heart rate in the 70s- Personally Reviewed  Physical Exam .   GEN: No acute distress.   Neck: No JVD Cardiac: Irregularly irregular, no murmurs, rubs, or gallops.  Respiratory: Clear to auscultation bilaterally. GI: Soft, nontender, non-distended  MS: No edema  Assessment & Plan .     67 year old with longstanding persistent atrial fibrillation (Dr. Elberta Fortis) here with chest discomfort subtle ST elevation in V5 V6, catheterization with nonobstructive CAD, EF approximately 40%, LVEDP 18 mmHg  Chest pain/elevated troponin 28/nonobstructive CAD/acute systolic heart failure/cardiomyopathy - No evidence of arterial occlusion.  Cardiac catheterization reviewed. -Possible myopericarditis -CHADSVASc or now is 2 given nonobstructive CAD, recommend Eliquis.  Social determinants of health have been challenging for him to afford Eliquis. -Awaiting echocardiogram -Metoprolol low-dose 12.5 mg XL daily has been started.  Blood pressures have been fairly soft at 93/68 last check.  We will hold off at this time on other goal-directed medical therapy such as Entresto, spironolactone,  Jardiance. -Colchicine 0.6 mg twice a day has been started  Diagnostic Dominance: Right   May be able to discharge later today after echocardiogram. Would like potential assistance with Eliquis.  For questions or updates, please contact [facility] Please consult [email or web address] for contact info under        Signed, Donato Schultz, MD   [place]:   1. Left ventricular ejection fraction, by estimation, is 40 to 45%. The  left ventricle has mildly decreased function. The left ventricle  demonstrates global hypokinesis. There is mild asymmetric left ventricular  hypertrophy. Left ventricular diastolic  function could not be evaluated.   2. Moderator band seen. Right ventricular systolic function is normal.  The right ventricular size is normal.   3. Left atrial size was mildly dilated.   4. Right atrial size was severely dilated.   5. The mitral valve is normal in structure. Moderate mitral valve  regurgitation. No evidence of mitral stenosis.   6. The aortic valve is normal in structure. Aortic valve regurgitation is  not visualized. No aortic stenosis is present.   7. There is mild dilatation of the aortic root, measuring 38 mm. There is  mild dilatation of the ascending aorta, measuring 42 mm.   8. The inferior vena cava is normal in size with greater than 50%  respiratory variability, suggesting right atrial pressure of 3 mmHg.   Based upon this echocardiogram,  I am comfortable with his discharge home with close follow-up and outpatient up titration of goal-directed medical therapy.  Donato Schultz, MD

## 2023-04-21 NOTE — Care Management Obs Status (Signed)
MEDICARE OBSERVATION STATUS NOTIFICATION   Patient Details  Name: Steadman Manglicmot MRN: [id number] Date of Birth: 08-Feb-1956   Medicare Observation Status Notification Given:  Yes    Lawerance Sabal, RN 04/21/2023, 4:14 PM

## 2023-04-21 NOTE — Progress Notes (Signed)
Echocardiogram 2D Echocardiogram has been performed.  Jose Holden 04/21/2023, 1:51 PM

## 2023-04-22 ENCOUNTER — Telehealth: Payer: Self-pay

## 2023-04-22 ENCOUNTER — Encounter (HOSPITAL_COMMUNITY): Payer: Self-pay | Admitting: Internal Medicine

## 2023-04-22 ENCOUNTER — Telehealth: Payer: Self-pay | Admitting: Cardiology

## 2023-04-22 NOTE — Transitions of Care (Post Inpatient/ED Visit) (Unsigned)
 04/22/2023  Name: Jose Holden MRN: [id number] DOB: [birth year]  Today's TOC FU Call Status: Today's TOC FU Call Status:: Successful TOC FU Call Completed TOC FU Call Complete Date: 04/22/23 Patient's Name and Date of Birth confirmed.  Transition Care Management Follow-up Telephone Call Date of Discharge: 04/21/23 Discharge Facility: [facility]) Type of Discharge: Inpatient Admission Primary Inpatient Discharge Diagnosis:: chest pain How have you been since you were released from the hospital?: Better Any questions or concerns?: No  Items Reviewed: Did you receive and understand the discharge instructions provided?: Yes Medications obtained,verified, and reconciled?: Yes (Medications Reviewed) Any new allergies since your discharge?: No Dietary orders reviewed?: Yes Type of Diet Ordered:: heart healthy Do you have support at home?: Yes  Medications Reviewed Today: Medications Reviewed Today     Reviewed by Babetta Paterson, Jordan Hawks, CMA (Certified Medical Assistant) on 04/22/23 at 1620  Med List Status: <None>   Medication Order Taking? Sig Documenting Provider Last Dose Status Informant  acetaminophen (TYLENOL) 500 MG tablet [id number] Yes Take 500 mg by mouth every 6 (six) hours as needed for mild pain (pain score 1-3). [provider] Taking Active Self  apixaban (ELIQUIS) 5 MG TABS tablet [id number] Yes Take 1 tablet (5 mg total) by mouth 2 (two) times daily. Azalee Course, [place] Taking Active   Cholecalciferol (VITAMIN D-3) 125 MCG (5000 UT) TABS [id number] Yes Take 5,000 Units by mouth in the morning. [provider] Taking Active Self  colchicine 0.6 MG tablet [id number] Yes Take 1 tablet (0.6 mg total) by mouth 2 (two) times daily. Azalee Course, [place] Taking Active   ferrous sulfate ([employer] IRON) 325 (65 FE) MG tablet [id number] Yes Take 1 tablet (325 mg total) by mouth 2 (two) times daily with a meal. Meryl Dare, MD Taking Active Self  melatonin 5 MG TABS [id number]  Yes Take 5 mg by mouth at bedtime. [provider] Taking Active Self  metoprolol succinate (TOPROL-XL) 25 MG 24 hr tablet [id number] Yes Take 0.5 tablets (12.5 mg total) by mouth daily. Azalee Course, [place] Taking Active   selenium 200 MCG TABS tablet [id number] Yes Take 200 mcg by mouth in the morning. [provider] Taking Active Self  sildenafil (VIAGRA) 100 MG tablet [id number] Yes Take 1 tablet (100 mg total) by mouth daily as needed for erectile dysfunction. Myrlene Broker, MD Taking Active Self  Spirulina 500 MG TABS [id number] Yes Take 500 mg by mouth in the morning. [provider] Taking Active Self  triamcinolone cream (KENALOG) 0.1 % [id number] Yes Apply 1 Application topically 2 (two) times daily.  Patient taking differently: Apply 1 Application topically daily as needed (for rash).   Myrlene Broker, MD Taking Active Self           Med Note Alvera Novel, Genoveva Ill   HYW Apr 20, 2023  7:59 PM) Still has on hand   vardenafil (LEVITRA) 20 MG tablet [id number] Yes Take 0.5-1 tablets (10-20 mg total) by mouth daily as needed for erectile dysfunction. Myrlene Broker, MD Taking Active Self           Med Note Thomasene Mohair   SWN Apr 20, 2023  7:57 PM)    zinc gluconate 50 MG tablet [id number] Yes Take 50 mg by mouth every other day. In the morning [provider] Taking Active Self            Home Care and Equipment/Supplies: Were Home Health Services Ordered?: NA Any new  equipment or medical supplies ordered?: NA  Functional Questionnaire: Do you need assistance with bathing/showering or dressing?: No Do you need assistance with meal preparation?: No Do you need assistance with eating?: No Do you have difficulty maintaining continence: No Do you need assistance with getting out of bed/getting out of a chair/moving?: No Do you have difficulty managing or taking your medications?: No  Follow up appointments reviewed: PCP  Follow-up appointment confirmed?: NA Specialist Hospital Follow-up appointment confirmed?: Yes Date of Specialist follow-up appointment?: 04/24/23 Follow-Up Specialty Provider:: Cardiology Maxine Glenn PA Do you need transportation to your follow-up appointment?: No Do you understand care options if your condition(s) worsen?: Yes-patient verbalized understanding  Patient wanted to let Ms. Rogue Jury and Romeo Apple (his nurses) that he was very grateful and pleased with their care and treatment during his time there.    Jose Holden, CMA  [facility] AW[facility] Team Direct Dial: [phone]

## 2023-04-22 NOTE — Telephone Encounter (Signed)
Patient is calling to find out the upper maximum gram intake of sodium that should be allotted for someone like him, with minimal heart disease He states he would also like to know the least amount of sodium he should consume. (Ex: 1500-2000). He states "2300g was the recommended gram intake of sodium for someone without heart disease but I know 1000g is too little." Please call.

## 2023-04-22 NOTE — Telephone Encounter (Signed)
Pt informed that he can discuss this more at his appt on Wednesday. Pt agreeable.   Mardelle Matte - does this patient need a general cardiologist to be following his non-obstructive CAD?  Does not look like they addressed this at hospital and told him to follow up with [place]...Marland Kitchen please let pt know when you see him.)

## 2023-04-23 LAB — LIPOPROTEIN A (LPA): Lipoprotein (a): 19.1 nmol/L (ref <75.0)

## 2023-04-23 NOTE — Progress Notes (Unsigned)
  Electrophysiology Office Note:   Date:  04/24/2023  ID:  Jose Holden, DOB 12/14/1955, MRN [id number]  Primary Cardiologist: None Electrophysiologist: Will Jorja Loa, MD      History of Present Illness:   Jose Holden is a 67 y.o. male with h/o AF, retinal detachment, h/o GI bleeds,  seen today for post hospital follow up.    Admitted 11/23 - 11/24 with chest pain. Pt went for a vigorous run and developed pain in the center of his chest, feeling like he had been punched. The pain improved but he had a persistent "soreness". He had previously switched from Eliquis to Plavix due to cost about 3-4 weeks ago, and wasn't sure if it was related. EKG was concerning for lateral STEMI. Taken to cath lab which showed non-obstructive disease as below. No significant pericardial effusion. Pt was resumed on Eliquis with new point on CHA2DS2VASc for CAD, and plan 3 month course of colchicine for possible myopericarditis.   Since discharge from hospital the patient reports doing very well. His symptoms have resolved without recurrence. Has numerous questions.  he denies chest pain, palpitations, dyspnea, PND, orthopnea, nausea, vomiting, dizziness, syncope, edema, weight gain, or early satiety.   Review of systems complete and found to be negative unless listed in HPI.   EP Information / Studies Reviewed:    EKG is not ordered today. EKG from 04/21/2023 reviewed which showed AF with controlled rates       Cath 04/20/2023 Nonobstructive coronary artery disease with 40% D1 and ramus intermedius stenoses as well as minimal luminal irregularities of the LAD and myocardial bridging of the distal LAD.  No angiographically significant disease noted in the LCx or RCA. Moderately reduced left ventricular systolic function with global hypokinesis (LVEF 35-45%). Mildly elevated left ventricular filling pressure (LVEDP 18 mmHg).  Echo 04/21/2023 LVEF 40-45%, global HK, normal RV, mild LAE, Severe RAE,  mod MR  Physical Exam:   VS:  BP 96/72   Pulse 64   Ht 6\' 6"  (1.981 m)   Wt 187 lb (84.8 kg)   SpO2 98%   BMI 21.61 kg/m    Wt Readings from Last 3 Encounters:  04/24/23 187 lb (84.8 kg)  04/20/23 180 lb (81.6 kg)  04/02/23 199 lb (90.3 kg)     GEN: Well nourished, well developed in no acute distress NECK: No JVD; No carotid bruits CARDIAC: Regular rate and rhythm, no murmurs, rubs, gallops RESPIRATORY:  Clear to auscultation without rales, wheezing or rhonchi  ABDOMEN: Soft, non-tender, non-distended EXTREMITIES:  No edema; No deformity   ASSESSMENT AND PLAN:    Longstanding persistent AF Continue Eliquis with CHA2DS2VASc 2 with new CAD.  Rate control.   CAD Non obstructive as above LDL 74, will defer statin to gen cards.   Chronic systolic CHF Continue toprol 12.5 mg daily No BP room to add other meds today.  Consider SGLT2 Consider MRA  Possible Myopericarditis Continue colchicine x 3 months per gen cards.   H/o GT bleeding.   Previously followed by Dr. Tenny Craw. Will get back in to see gen cards for CHF. Will also refer to Clinical pharmacy for GDMT adjustment.   Follow up with Dr. Elberta Fortis 6 months, sooner if EP issues arise. Stressed importance of completing colchicine course.   Signed, Graciella Freer, PA-C

## 2023-04-24 ENCOUNTER — Ambulatory Visit: Payer: Medicare HMO | Attending: Student | Admitting: Student

## 2023-04-24 ENCOUNTER — Other Ambulatory Visit: Payer: Self-pay | Admitting: *Deleted

## 2023-04-24 ENCOUNTER — Encounter: Payer: Self-pay | Admitting: Student

## 2023-04-24 VITALS — BP 96/72 | HR 64 | Ht 78.0 in | Wt 187.0 lb

## 2023-04-24 DIAGNOSIS — Z79899 Other long term (current) drug therapy: Secondary | ICD-10-CM

## 2023-04-24 DIAGNOSIS — I319 Disease of pericardium, unspecified: Secondary | ICD-10-CM

## 2023-04-24 DIAGNOSIS — I482 Chronic atrial fibrillation, unspecified: Secondary | ICD-10-CM | POA: Diagnosis not present

## 2023-04-24 DIAGNOSIS — I25119 Atherosclerotic heart disease of native coronary artery with unspecified angina pectoris: Secondary | ICD-10-CM

## 2023-04-24 MED ORDER — RIVAROXABAN 20 MG PO TABS: 20.0000 mg | ORAL_TABLET | Freq: Every day | ORAL | 11 refills | Status: DC

## 2023-04-24 MED ORDER — RIVAROXABAN 20 MG PO TABS: 20.0000 mg | ORAL_TABLET | Freq: Every day | ORAL | Status: DC

## 2023-04-24 NOTE — Patient Instructions (Addendum)
Medication Instructions:  Stop eliquis, take last dose tonight. Start xarelto 20 mg tomorrow 04/25/23. *If you need a refill on your cardiac medications before your next appointment, please call your pharmacy*  Lab Work: None ordered If you have labs (blood work) drawn today and your tests are completely normal, you will receive your results only by: MyChart Message (if you have MyChart) OR A paper copy in the mail If you have any lab test that is abnormal or we need to change your treatment, we will call you to review the results.  Follow-Up: At [facility], you and your health needs are our priority.  As part of our continuing mission to provide you with exceptional heart care, we have created designated Provider Care Teams.  These Care Teams include your primary Cardiologist (physician) and Advanced Practice Providers (APPs -  Physician Assistants and Nurse Practitioners) who all work together to provide you with the care you need, when you need it.  Your next appointments:  1.Schedule 2-3 week follow up with one of the pharmacists here in our office. 2.Schedule 4-6 week follow up with general cardiology. 2.See Dr Elberta Fortis in 6 months.

## 2023-05-28 ENCOUNTER — Ambulatory Visit: Payer: Medicare HMO | Attending: Cardiology | Admitting: Student

## 2023-05-28 VITALS — BP 102/70 | HR 78 | Ht 78.0 in | Wt 196.4 lb

## 2023-05-28 DIAGNOSIS — I25119 Atherosclerotic heart disease of native coronary artery with unspecified angina pectoris: Secondary | ICD-10-CM | POA: Diagnosis not present

## 2023-05-28 MED ORDER — EMPAGLIFLOZIN 10 MG PO TABS: 10.0000 mg | ORAL_TABLET | Freq: Every day | ORAL | 11 refills | Status: DC

## 2023-05-28 NOTE — Patient Instructions (Addendum)
 Changes made by your pharmacist Robbi Blanch, PharmD at today's visit:    Instructions/Changes  (what do you need to do) Your Notes  (what you did and when you did it)  Start taking Jardiance  10 mg daily and take half of the current metoprolol  dose     Watch salt intake    Check BP daily and bring BP log at the next office visit     Bring all of your meds, your BP cuff and your record of home blood pressures to your next appointment.    HOW TO TAKE YOUR BLOOD PRESSURE AT HOME  Rest 5 minutes before taking your blood pressure.  Don't smoke or drink caffeinated beverages for at least 30 minutes before. Take your blood pressure before (not after) you eat. Sit comfortably with your back supported and both feet on the floor (don't cross your legs). Elevate your arm to heart level on a table or a desk. Use the proper sized cuff. It should fit smoothly and snugly around your bare upper arm. There should be enough room to slip a fingertip under the cuff. The bottom edge of the cuff should be 1 inch above the crease of the elbow. Ideally, take 3 measurements at one sitting and record the average.  Important lifestyle changes to control high blood pressure  Intervention  Effect on the BP  Lose extra pounds and watch your waistline Weight loss is one of the most effective lifestyle changes for controlling blood pressure. If you're overweight or obese, losing even a small amount of weight can help reduce blood pressure. Blood pressure might go down by about 1 millimeter of mercury (mm Hg) with each kilogram (about 2.2 pounds) of weight lost.  Exercise regularly As a general goal, aim for at least 30 minutes of moderate physical activity every day. Regular physical activity can lower high blood pressure by about 5 to 8 mm Hg.  Eat a healthy diet Eating a diet rich in whole grains, fruits, vegetables, and low-fat dairy products and low in saturated fat and cholesterol. A healthy diet can lower  high blood pressure by up to 11 mm Hg.  Reduce salt (sodium) in your diet Even a small reduction of sodium in the diet can improve heart health and reduce high blood pressure by about 5 to 6 mm Hg.  Limit alcohol  One drink equals 12 ounces of beer, 5 ounces of wine, or 1.5 ounces of 80-proof liquor.  Limiting alcohol  to less than one drink a day for women or two drinks a day for men can help lower blood pressure by about 4 mm Hg.   If you have any questions or concerns please use My Chart to send questions or call the office at [phone]

## 2023-05-28 NOTE — Progress Notes (Signed)
 Patient ID: Jose Holden                 DOB: Mar 11, 1956                      MRN: [id number]     HPI: Jose Holden is a 67 y.o. male referred by Jose Passey, PA  to pharmacy clinic for HF medication management. PMH is significant for PAF,CAD,myopericarditis,retinal detachment, h/o GI bleeds. Moderately reduced left ventricular systolic function with global hypokinesis (LVEF 40-45% on 04/21/2023).Recent cath lab  showed non-obstructive disease.    Patient presented today for HF medications optimization. Reports he does not check BP at home. He feels great he follows healthy diet and exercises regularly.  Discussion with patient today included the following: cardiac medication indications, introduction to GDMT clinic, reasoning behind medication titration, importance of medication adherence, and patient engagement. Denies dizziness, lightheadedness, and fatigue. Denies chest pain,SOB or palpitations. Able to complete all ADLs.Denies LEE, PND, or orthopnea. Appetite has been Normal. he  adheres to a low-salt diet.      Current CHF meds: metoprolol  12.5 mg daily  Previously tried: none  Adherence Assessment  Do you ever forget to take your medication? [] Yes [x] No  Do you ever skip doses due to side effects? [] Yes [x] No  Do you have trouble affording your medicines? [] Yes [x] No  Are you ever unable to pick up your medication due to transportation difficulties? [] Yes [x] No  Do you ever stop taking your medications because you don't believe they are helping? [] Yes [x] No  Do you check your weight daily? [] Yes [x] No   Adherence strategy: just remembers to take it   Barriers to obtaining medications: none  BP goal: <130/80   Family History:   Social History:  Alcohol : moderate  Smoking: never   Diet: low salt diet, low fat    Exercise: swimming - everyday 30 min and resistance - couple times per day   Home BP readings: none   Wt Readings from Last 3 Encounters:   04/24/23 187 lb (84.8 kg)  04/20/23 180 lb (81.6 kg)  04/02/23 199 lb (90.3 kg)   BP Readings from Last 3 Encounters:  04/24/23 96/72  04/21/23 101/71  04/02/23 118/62   Pulse Readings from Last 3 Encounters:  04/24/23 64  04/21/23 77  10/08/22 62    Renal function: CrCl cannot be calculated (Patient's most recent lab result is older than the maximum 21 days allowed.).  Past Medical History:  Diagnosis Date   A-fib (HCC)    On Aspirin  only   Anxiety    Cataract    removed left eye that caused retinal detachment x2   ED (erectile dysfunction)    Heart murmur    past hx per pt    Lactose intolerance    Retinal detachment    x2- left eye     Current Outpatient Medications on File Prior to Visit  Medication Sig Dispense Refill   acetaminophen  (TYLENOL ) 500 MG tablet Take 500 mg by mouth every 6 (six) hours as needed for mild pain (pain score 1-3).     Cholecalciferol (VITAMIN D-3) 125 MCG (5000 UT) TABS Take 5,000 Units by mouth in the morning.     colchicine  0.6 MG tablet Take 1 tablet (0.6 mg total) by mouth 2 (two) times daily. 60 tablet 2   ferrous sulfate  ([employer] IRON) 325 (65 FE) MG tablet Take 1 tablet (325 mg total) by mouth 2 (two) times daily  with a meal.  3   melatonin 5 MG TABS Take 5 mg by mouth at bedtime.     metoprolol  succinate (TOPROL -XL) 25 MG 24 hr tablet Take 0.5 tablets (12.5 mg total) by mouth daily. 45 tablet 1   rivaroxaban  (XARELTO ) 20 MG TABS tablet Take 1 tablet (20 mg total) by mouth daily with supper. 30 tablet 11   rivaroxaban  (XARELTO ) 20 MG TABS tablet Take 1 tablet (20 mg total) by mouth daily with supper. 7 tablet    rivaroxaban  (XARELTO ) 20 MG TABS tablet Take 1 tablet (20 mg total) by mouth daily with supper.     selenium  200 MCG TABS tablet Take 200 mcg by mouth in the morning.     sildenafil  (VIAGRA ) 100 MG tablet Take 1 tablet (100 mg total) by mouth daily as needed for erectile dysfunction. 20 tablet 11   Spirulina 500 MG TABS Take 500  mg by mouth in the morning.     triamcinolone  cream (KENALOG ) 0.1 % Apply 1 Application topically 2 (two) times daily. (Patient taking differently: Apply 1 Application topically daily as needed (for rash).) 100 g 0   vardenafil  (LEVITRA ) 20 MG tablet Take 0.5-1 tablets (10-20 mg total) by mouth daily as needed for erectile dysfunction. 20 tablet 11   zinc gluconate 50 MG tablet Take 50 mg by mouth every other day. In the morning     No current facility-administered medications on file prior to visit.    No Known Allergies   Assessment/Plan:  1. CHF -   Assessment and Plan:  LVEF 40-45% on 04/21/2023,denies dizziness, lightheadedness, and fatigue. Denies chest pain,SOB or palpitations. Able to complete all ADLs. Denies LEE, PND, or orthopnea. Appetite has been Normal. he  adheres to a low-salt diet. Does not check BP at home, in office BP 102/70 heart rate 78. Currently on metoprolol  12.5 mg. Patient reports it is worsening ED problem In future can consider nebivolol  which has lower risk of causing/worsening ED then other beta-blockers. However patient made aware that GDMT beta-blocker includes only metoprolol , carvedilol and bisoprolol.  In agreement to initiate SGLT2i, will start taking Jardiance  10 mg daily will repeat BMP in 2 weeks     Thank you   Robbi Blanch, Pharm.D [facility] [address] [place], [place] [address]  Phone: [phone]; Fax: [phone]

## 2023-06-02 ENCOUNTER — Encounter: Payer: Self-pay | Admitting: Student

## 2023-06-03 ENCOUNTER — Telehealth: Payer: Self-pay | Admitting: Student

## 2023-06-03 NOTE — Telephone Encounter (Signed)
 Patient calling about his appt his has scheduled for tomorrow on 1/7, he wants to know why he has this appt when he was seen already on 12/31 by a pharmacist.    Pt c/o medication issue:  1. Name of Medication: empagliflozin  (JARDIANCE ) 10 MG TABS tablet   2. How are you currently taking this medication (dosage and times per day)?   3. Are you having a reaction (difficulty breathing--STAT)?   4. What is your medication issue? He wants to know why he was prescribed this medication.

## 2023-06-04 ENCOUNTER — Ambulatory Visit: Payer: Medicare HMO

## 2023-06-04 NOTE — Telephone Encounter (Signed)
 Spoke to patient, Jose Holden is cost prohibitive so enrolled patient in [facility].   Patient want to cancelled today's appointment as it was scheduled by mistake.

## 2023-06-05 NOTE — Progress Notes (Signed)
 Cardiology Office Note:  .   Date:  06/06/2023  ID:  Jose Holden, DOB March 28, 1956, MRN [id number] PCP: Rollene Almarie LABOR, MD   HeartCare Providers Cardiologist:  None Electrophysiologist:  [name], MD {  History of Present Illness: .   Jose Holden is a 68 y.o. male with a past medical history of AF, retinal attachment, history of GI bleeds, recently seen by EP for hospital follow-up.  History includes admission 11/23 to 11/24 with chest pain.  Patient went for a vigorous run and developed pain in center of his chest feeling like he had been punched.  The pain improved but he had a persistent soreness.  He had previously switched from Eliquis  to Plavix  due to cost.  This happened about 3 to 4 weeks ago and the patient was unsure if this was related.  EKG was concerning for lateral STEMI.  Taken to the Cath Lab which showed nonobstructive disease.  No significant pericardial effusion.  Patient was resumed on Eliquis  since his CHA2DS2-VASc score increased.  Plan for 3 months of colchicine  for possible myopericarditis.  When he was seen in follow-up shortly after discharge he was doing well.  Symptoms had resolved without reoccurrence.  Denied chest pain, palpitations, dyspnea, PND, orthopnea, nausea, vomiting, dizziness, syncope, edema, weight gain, or early satiety.  Today, he presents with a history of heart disease including atrial fibrillation with concerns about the sexual side effects of metoprolol , specifically delayed orgasm. The patient reports that he has been feeling fine overall and has not experienced any chest pain or other cardiac symptoms since his hospitalization in November. The patient believes that his previous hospitalization was due to high sodium intake, which he has since corrected by eliminating high-sodium foods from his diet. The patient is currently on metoprolol , Xarelto , and Jardiance . The patient is considering alternatives to metoprolol   due to its sexual side effects and is questioning the necessity of this medication.  Reports no shortness of breath nor dyspnea on exertion. Reports no chest pain, pressure, or tightness. No edema, orthopnea, PND. Reports no palpitations.   Discussed the use of AI scribe software for clinical note transcription with the patient, who gave verbal consent to proceed.  ROS: Pertinent ROS in HPI  Studies Reviewed: SABRA        Cath 04/20/2023 Nonobstructive coronary artery disease with 40% D1 and ramus intermedius stenoses as well as minimal luminal irregularities of the LAD and myocardial bridging of the distal LAD.  No angiographically significant disease noted in the LCx or RCA. Moderately reduced left ventricular systolic function with global hypokinesis (LVEF 35-45%). Mildly elevated left ventricular filling pressure (LVEDP 18 mmHg).   Echo 04/21/2023 LVEF 40-45%, global HK, normal RV, mild LAE, Severe RAE, mod MR Risk Assessment/Calculations:    CHA2DS2-VASc Score = 3   This indicates a 3.2% annual risk of stroke. The patient's score is based upon: CHF History: 1 HTN History: 0 Diabetes History: 0 Stroke History: 0 Vascular Disease History: 1 Age Score: 1 Gender Score: 0      Physical Exam:   VS:  BP 118/70 (BP Location: Right Arm, Patient Position: Sitting, Cuff Size: Normal)   Pulse 64   Resp 16   Ht 6' 6 (1.981 m)   Wt 196 lb 12.8 oz (89.3 kg)   SpO2 98%   BMI 22.74 kg/m    Wt Readings from Last 3 Encounters:  06/06/23 196 lb 12.8 oz (89.3 kg)  05/28/23 196 lb 6.4 oz (  89.1 kg)  04/24/23 187 lb (84.8 kg)    GEN: Well nourished, well developed in no acute distress NECK: No JVD; No carotid bruits CARDIAC: RRR, no murmurs, rubs, gallops RESPIRATORY:  Clear to auscultation without rales, wheezing or rhonchi  ABDOMEN: Soft, non-tender, non-distended EXTREMITIES:  No edema; No deformity   ASSESSMENT AND PLAN: .    Chest Pain/mild CAD History of chest pain after  vigorous exercise in November, with non-obstructive disease on catheterization. Currently asymptomatic. -Continue current management.  Atrial Fibrillation History of atrial fibrillation with two prior cardioversions. Currently on Xarelto  for anticoagulation. No current symptoms of arrhythmia noted on examination. -Continue Xarelto . -His CHA2DS2-VASc score is now 3.2%  Metoprolol  Side Effects Reports sexual side effects (delayed orgasm) from Metoprolol . -Consult with pharmacist and changed to Nebivolol    Pre-Diabetes History of elevated A1c, currently on Jardiance  for heart failure and potential benefit for blood glucose control. -Continue Jardiance .  Hypertension Well-controlled on current regimen. -Continue current management.  General Health Maintenance -Consider purchase of home blood pressure monitor. -Annual EKG unless symptomatic. -Follow-up appointment given change to BB     Dispo: He can follow-up in 3 months with Dr. Mady  Signed, Orren LOISE Fabry, PA-C

## 2023-06-06 ENCOUNTER — Ambulatory Visit: Payer: Medicare (Managed Care) | Attending: Physician Assistant | Admitting: Physician Assistant

## 2023-06-06 ENCOUNTER — Encounter: Payer: Self-pay | Admitting: Physician Assistant

## 2023-06-06 VITALS — BP 118/70 | HR 64 | Resp 16 | Ht 78.0 in | Wt 196.8 lb

## 2023-06-06 DIAGNOSIS — I482 Chronic atrial fibrillation, unspecified: Secondary | ICD-10-CM | POA: Diagnosis not present

## 2023-06-06 DIAGNOSIS — I319 Disease of pericardium, unspecified: Secondary | ICD-10-CM | POA: Diagnosis not present

## 2023-06-06 DIAGNOSIS — I25119 Atherosclerotic heart disease of native coronary artery with unspecified angina pectoris: Secondary | ICD-10-CM | POA: Diagnosis not present

## 2023-06-06 DIAGNOSIS — I4819 Other persistent atrial fibrillation: Secondary | ICD-10-CM

## 2023-06-06 DIAGNOSIS — Z79899 Other long term (current) drug therapy: Secondary | ICD-10-CM

## 2023-06-06 MED ORDER — NEBIVOLOL HCL 2.5 MG PO TABS: 2.5000 mg | ORAL_TABLET | Freq: Every day | ORAL | 1 refills | Status: DC

## 2023-06-06 NOTE — Patient Instructions (Signed)
 Medication Instructions:  Change Metoprolol  to Nebivolo 2.5mg   *If you need a refill on your cardiac medications before your next appointment, please call your pharmacy*   Lab Work: None  If you have labs (blood work) drawn today and your tests are completely normal, you will receive your results only by: MyChart Message (if you have MyChart) OR A paper copy in the mail If you have any lab test that is abnormal or we need to change your treatment, we will call you to review the results.   Testing/Procedures: None    Follow-Up: At [facility], you and your health needs are our priority.  As part of our continuing mission to provide you with exceptional heart care, we have created designated Provider Care Teams.  These Care Teams include your primary Cardiologist (physician) and Advanced Practice Providers (APPs -  Physician Assistants and Nurse Practitioners) who all work together to provide you with the care you need, when you need it.    Your next appointment:   3 month(s)  Provider:   Lonni Hanson MD

## 2023-06-22 LAB — BASIC METABOLIC PANEL
BUN/Creatinine Ratio: 43 — ABNORMAL HIGH (ref 10–24)
BUN: 40 mg/dL — ABNORMAL HIGH (ref 8–27)
CO2: 19 mmol/L — ABNORMAL LOW (ref 20–29)
Calcium: 9 mg/dL (ref 8.6–10.2)
Chloride: 105 mmol/L (ref 96–106)
Creatinine, Ser: 0.92 mg/dL (ref 0.76–1.27)
Glucose: 82 mg/dL (ref 70–99)
Potassium: 4.2 mmol/L (ref 3.5–5.2)
Sodium: 141 mmol/L (ref 134–144)
eGFR: 92 mL/min/{1.73_m2} (ref >59)

## 2023-07-08 ENCOUNTER — Ambulatory Visit: Payer: Medicare (Managed Care) | Attending: Cardiology | Admitting: Student

## 2023-07-08 ENCOUNTER — Encounter: Payer: Self-pay | Admitting: Student

## 2023-07-08 ENCOUNTER — Other Ambulatory Visit (HOSPITAL_COMMUNITY): Payer: Self-pay

## 2023-07-08 VITALS — BP 98/60 | HR 65 | Wt 195.8 lb

## 2023-07-08 DIAGNOSIS — Z79899 Other long term (current) drug therapy: Secondary | ICD-10-CM | POA: Diagnosis not present

## 2023-07-08 MED ORDER — RIVAROXABAN 20 MG PO TABS: 20.0000 mg | ORAL_TABLET | Freq: Every day | ORAL | 0 refills | Status: DC

## 2023-07-08 NOTE — Addendum Note (Signed)
 Addended by: Chen Holzman K on: 07/08/2023 03:35 PM   Modules accepted: Orders

## 2023-07-08 NOTE — Progress Notes (Signed)
 Patient ID: [name]                 DOB: April 26, 1956                      MRN: [id number]     HPI: Jose Holden is a 68 y.o. male referred by Jose Bridge, PA  to pharmacy clinic for HF medication management. PMH is significant for PAF,CAD,myopericarditis,retinal detachment, h/o GI bleeds. Moderately reduced left ventricular systolic function with global hypokinesis (LVEF 40-45% on 04/21/2023).Recent cath lab  showed non-obstructive disease.     Patient presented today for HF medications optimization. He bought new BP monitor. It is wrist cuff found out to be inaccurate. Will return and buy arm cuff. BMP post Jardiance  addition was WNL except BUN was high reiterated hydration Reports she feels great. Denies chest pain,SOB or palpitations. Able to complete all ADLs.Denies LEE, PND, or orthopnea. Appetite has been Normal. he  adheres to a low-salt diet. Metoprolol  to nebivolol  switch did not make huge difference in the side effects (delayed orgasm). However want to to continue on the nebivolol . He is out of Xarelto  asking for some samples. 2 weeks worth of Xarelto  samples provided. PAP info for Xarelto  and Eliquis  provided to the patient. He will find out total medication expense so far this year to see if he meets eligibility criteria.    Current CHF meds: nebivolol  2.5 mg  mg daily , Jardiance  10 mg daily  Previously tried: none  Adherence Assessment  Do you ever forget to take your medication? [] Yes [x] No  Do you ever skip doses due to side effects? [] Yes [x] No  Do you have trouble affording your medicines? [] Yes [x] No  Are you ever unable to pick up your medication due to transportation difficulties? [] Yes [x] No  Do you ever stop taking your medications because you don't believe they are helping? [] Yes [x] No  Do you check your weight daily? [] Yes [x] No   Adherence strategy: just remembers to take it   Barriers to obtaining medications: none  BP goal: <130/80     Social History:  Alcohol : moderate  Smoking: never   Diet: low salt diet, low fat    Exercise: swimming - everyday 30 min and resistance - couple times per day   Home BP readings: none   Wt Readings from Last 3 Encounters:  06/06/23 196 lb 12.8 oz (89.3 kg)  05/28/23 196 lb 6.4 oz (89.1 kg)  04/24/23 187 lb (84.8 kg)   BP Readings from Last 3 Encounters:  06/06/23 118/70  05/28/23 102/70  04/24/23 96/72   Pulse Readings from Last 3 Encounters:  06/06/23 64  05/28/23 78  04/24/23 64    Renal function: CrCl cannot be calculated (Unknown ideal weight.).  Past Medical History:  Diagnosis Date   A-fib (HCC)    On Aspirin  only   Anxiety    Cataract    removed left eye that caused retinal detachment x2   ED (erectile dysfunction)    Heart murmur    past hx per pt    Lactose intolerance    Retinal detachment    x2- left eye     Current Outpatient Medications on File Prior to Visit  Medication Sig Dispense Refill   acetaminophen  (TYLENOL ) 500 MG tablet Take 500 mg by mouth every 6 (six) hours as needed for mild pain (pain score 1-3).     Cholecalciferol (VITAMIN D-3) 125 MCG (5000 UT) TABS Take 5,000 Units  by mouth in the morning.     colchicine  0.6 MG tablet Take 1 tablet (0.6 mg total) by mouth 2 (two) times daily. 60 tablet 2   empagliflozin  (JARDIANCE ) 10 MG TABS tablet Take 1 tablet (10 mg total) by mouth daily before breakfast. 30 tablet 11   ferrous sulfate  ([employer] IRON) 325 (65 FE) MG tablet Take 1 tablet (325 mg total) by mouth 2 (two) times daily with a meal.  3   melatonin 5 MG TABS Take 5 mg by mouth at bedtime.     metoprolol  succinate (TOPROL -XL) 25 MG 24 hr tablet Take 0.5 tablets (12.5 mg total) by mouth daily. 45 tablet 1   nebivolol  (BYSTOLIC ) 2.5 MG tablet Take 1 tablet (2.5 mg total) by mouth daily. 30 tablet 1   rivaroxaban  (XARELTO ) 20 MG TABS tablet Take 1 tablet (20 mg total) by mouth daily with supper. 30 tablet 11   rivaroxaban  (XARELTO ) 20  MG TABS tablet Take 1 tablet (20 mg total) by mouth daily with supper. 7 tablet    rivaroxaban  (XARELTO ) 20 MG TABS tablet Take 1 tablet (20 mg total) by mouth daily with supper.     selenium  200 MCG TABS tablet Take 200 mcg by mouth in the morning.     sildenafil  (VIAGRA ) 100 MG tablet Take 1 tablet (100 mg total) by mouth daily as needed for erectile dysfunction. 20 tablet 11   Spirulina 500 MG TABS Take 500 mg by mouth in the morning.     vardenafil  (LEVITRA ) 20 MG tablet Take 0.5-1 tablets (10-20 mg total) by mouth daily as needed for erectile dysfunction. 20 tablet 11   zinc gluconate 50 MG tablet Take 50 mg by mouth every other day. In the morning     No current facility-administered medications on file prior to visit.    No Known Allergies   Assessment/Plan:  1. CHF -   Assessment and Plan:  LVEF 40-45% on 04/21/2023, Denies dizziness, lightheadedness, and fatigue Denies chest pain,SOB or palpitations Able to complete all ADLs. Denies LEE, PND, or orthopnea. Appetite has been Normal. He adheres to a low-salt diet. Reports he feels great!  Current HF medications - nebivolol  2.5 mg daily and Jardiance  10 mg daily  Due to soft BP unable to initiate any other HF medications ( in clinic BP  98/60 heart rate 65)  Takes propranolol  10 mg prn for stage anxiety - on the days he need to take propranolol  advised to avoid taking nebivolol   Patient to buy new BP monitor and bring it in at next OV for validation and keep record of BP readings    Thank you   Jose Holden, Pharm.D [facility] A Division of [facility] [address] [place], [place] [address]  Phone: [phone]; Fax: [phone]

## 2023-07-09 ENCOUNTER — Other Ambulatory Visit: Payer: Self-pay | Admitting: Internal Medicine

## 2023-07-09 MED ORDER — SILDENAFIL CITRATE 100 MG PO TABS: 100.0000 mg | ORAL_TABLET | Freq: Every day | ORAL | 11 refills | Status: AC | PRN

## 2023-07-09 NOTE — Telephone Encounter (Signed)
Copied from CRM [id number]. Topic: Clinical - Medication Refill >> Jul 09, 2023  2:27 PM Truddie Crumble wrote: Most Recent Primary Care Visit:  Provider: Hillard Danker A  Department: [facility]  Visit Type: PHYSICAL  Date: 04/02/2023  Medication: sildenafil  Has the patient contacted their pharmacy? Yes (Agent: If no, request that the patient contact the pharmacy for the refill. If patient does not wish to contact the pharmacy document the reason why and proceed with request.) (Agent: If yes, when and what did the pharmacy advise?)  Is this the correct pharmacy for this prescription? Yes If no, delete pharmacy and type the correct one.  This is the patient's preferred pharmacy:  [facility] [address], [place] - [address] [place] [address] Phone: [phone] Fax: [phone]   Has the prescription been filled recently? Yes  Is the patient out of the medication? Yes  Has the patient been seen for an appointment in the last year OR does the patient have an upcoming appointment? Yes  Can we respond through MyChart? Yes  Agent: Please be advised that Rx refills may take up to 3 business days. We ask that you follow-up with your pharmacy.

## 2023-07-09 NOTE — Telephone Encounter (Signed)
Last Fill: 12/28/21   Last OV: 04/02/23 Next OV: 07/12/23  Routing to provider for review/authorization.

## 2023-07-12 ENCOUNTER — Encounter: Payer: Self-pay | Admitting: Internal Medicine

## 2023-07-12 ENCOUNTER — Ambulatory Visit: Payer: Medicare (Managed Care) | Admitting: Internal Medicine

## 2023-07-12 VITALS — BP 118/64 | HR 72 | Temp 98.0°F | Ht 78.0 in | Wt 194.0 lb

## 2023-07-12 DIAGNOSIS — M79672 Pain in left foot: Secondary | ICD-10-CM | POA: Diagnosis not present

## 2023-07-12 NOTE — Patient Instructions (Signed)
We will get you back in with [facility]

## 2023-07-12 NOTE — Progress Notes (Signed)
   Subjective:   Patient ID: Jose Holden, male    DOB: [birth year], 68 y.o.   MRN: [id number]  HPI The patient is a 68 YO man coming in for left foot pain. Has seen podiatry before and would like to return.   Review of Systems  Constitutional: Negative.   HENT: Negative.    Eyes: Negative.   Respiratory:  Negative for cough, chest tightness and shortness of breath.   Cardiovascular:  Negative for chest pain, palpitations and leg swelling.  Gastrointestinal:  Negative for abdominal distention, abdominal pain, constipation, diarrhea, nausea and vomiting.  Musculoskeletal:  Positive for arthralgias.  Skin: Negative.   Neurological: Negative.   Psychiatric/Behavioral: Negative.      Objective:  Physical Exam Constitutional:      Appearance: He is well-developed.  HENT:     Head: Normocephalic and atraumatic.  Cardiovascular:     Rate and Rhythm: Normal rate and regular rhythm.  Pulmonary:     Effort: Pulmonary effort is normal. No respiratory distress.     Breath sounds: Normal breath sounds. No wheezing or rales.  Abdominal:     General: Bowel sounds are normal. There is no distension.     Palpations: Abdomen is soft.     Tenderness: There is no abdominal tenderness. There is no rebound.  Musculoskeletal:        General: Tenderness present.     Cervical back: Normal range of motion.  Skin:    General: Skin is warm and dry.  Neurological:     Mental Status: He is alert and oriented to person, place, and time.     Coordination: Coordination normal.     Vitals:   07/12/23 1037  BP: 118/64  Pulse: 72  Temp: 98 F (36.7 C)  TempSrc: Oral  SpO2: 98%  Weight: 194 lb (88 kg)  Height: 6\' 6"  (1.981 m)    Assessment & Plan:  Visit time 15 minutes in face to face communication with patient and coordination of care, additional 5 minutes spent in record review, coordination or care, ordering tests, communicating/referring to other healthcare professionals, documenting in  medical records all on the same day of the visit for total time 20 minutes spent on the visit.

## 2023-07-12 NOTE — Assessment & Plan Note (Signed)
Foot examined and some callusing. Advised to use arch support inserts in all pairs of shoes. Referral back to podiatry for some debridement of calluses.

## 2023-07-19 ENCOUNTER — Telehealth: Payer: Self-pay

## 2023-07-19 ENCOUNTER — Other Ambulatory Visit: Payer: Self-pay | Admitting: Physician Assistant

## 2023-07-19 NOTE — Telephone Encounter (Signed)
Copied from CRM [id number]. Topic: Medical Record Request - Records Request >> Jul 19, 2023  2:15 PM Jose Holden wrote: Reason for CRM: patient calling in regards to his previous vaccinations, he is anxious after seeing measles in the news and can not remember if he ever had that vaccine, he is wanting to know all of his previous vaccines, and if there are any more that can be recommended   pt call back [phone]

## 2023-07-19 NOTE — Telephone Encounter (Signed)
I do not see this vaccine do you recommend patient get this

## 2023-07-22 ENCOUNTER — Other Ambulatory Visit: Payer: Self-pay | Admitting: Cardiology

## 2023-07-22 NOTE — Telephone Encounter (Signed)
 Jose Horn, RN pt does not have a Radiation protection practitioner. Pt only sees Dr. Elberta Fortis. Does Dr. Elberta Fortis want to refill pt's medication colchicine or refill to pt's PCP? Please advise

## 2023-07-22 NOTE — Telephone Encounter (Signed)
 Pt is requesting a refill on colchicine. Pt has an upcoming appt with Dr. Okey Dupre on 09/05/23. Dr. Elberta Fortis nurse Dory Horn, RN would like Dr. Okey Dupre to decide if he would like to refill this medication, because this medication was prescribed in the hospital. Please address

## 2023-07-22 NOTE — Telephone Encounter (Signed)
*  STAT* If patient is at the pharmacy, call can be transferred to refill team.   1. Which medications need to be refilled? (please list name of each medication and dose if known)   colchicine 0.6 MG tablet   2. Would you like to learn more about the convenience, safety, & potential cost savings by using the [facility]?   3. Are you open to using the [facility] (Type [facility]. ).  4. Which pharmacy/location (including street and city if local pharmacy) is medication to be sent to?  [facility] [address] [place], [place] - [address]   5. Do they need a 30 day or 90 day supply?   90 days  Patient stated he is completely out of this medication and will need this before noon today.

## 2023-07-22 NOTE — Telephone Encounter (Signed)
 Pt's pharmacy is requesting a refill on medication colchicine. This medication was prescribed in the hosptial by Azalee Course, PA. Would Dr. Elberta Fortis like to refill this non cardiac medication? Please address

## 2023-07-22 NOTE — Telephone Encounter (Signed)
 Can have 1 dose MMR with pharmacy consistent with CDC recommendations.

## 2023-07-23 NOTE — Addendum Note (Signed)
 Addended by: Frutoso Schatz on: 07/23/2023 07:42 AM   Modules accepted: Orders

## 2023-07-30 ENCOUNTER — Ambulatory Visit: Payer: Medicare (Managed Care) | Admitting: Podiatry

## 2023-08-01 ENCOUNTER — Other Ambulatory Visit: Payer: Self-pay | Admitting: Physician Assistant

## 2023-08-05 ENCOUNTER — Telehealth: Payer: Self-pay | Admitting: Podiatry

## 2023-08-05 ENCOUNTER — Ambulatory Visit: Payer: Medicare (Managed Care) | Admitting: Podiatry

## 2023-08-05 NOTE — Telephone Encounter (Signed)
 Per vm from pt on 3/8 @ 1127am pt had family emergency out of town and cannot make appt 3/10.  Returned call and left message for pt that I received the message and cxled the appt and for him to call to r.s when he is available.

## 2023-08-06 ENCOUNTER — Other Ambulatory Visit: Payer: Self-pay | Admitting: Physician Assistant

## 2023-08-12 ENCOUNTER — Other Ambulatory Visit: Payer: Self-pay | Admitting: Cardiology

## 2023-08-22 DIAGNOSIS — J101 Influenza due to other identified influenza virus with other respiratory manifestations: Secondary | ICD-10-CM | POA: Diagnosis not present

## 2023-08-22 DIAGNOSIS — R059 Cough, unspecified: Secondary | ICD-10-CM | POA: Diagnosis not present

## 2023-08-22 DIAGNOSIS — J040 Acute laryngitis: Secondary | ICD-10-CM | POA: Diagnosis not present

## 2023-08-26 ENCOUNTER — Ambulatory Visit: Payer: Medicare (Managed Care)

## 2023-08-26 NOTE — Progress Notes (Deleted)
 Patient ID: Jose Holden                 DOB: 08/30/1955                      MRN: [id number]     HPI: Jose Holden is a 68 y.o. male referred by Maxine Glenn, PA  to pharmacy clinic for HF medication management. PMH is significant for PAF,CAD,myopericarditis,retinal detachment, h/o GI bleeds. Moderately reduced left ventricular systolic function with global hypokinesis (LVEF 40-45% on 04/21/2023).Recent cath lab  showed non-obstructive disease.    Patient last seen in PharmD clinic 07/08/23. No medication changes were made due to soft BP. He was asked to purchase a new BP cuff and continue to monitor BP.   Patient presented today for HF medications optimization. He bought new BP monitor. It is wrist cuff found out to be inaccurate. Will return and buy arm cuff. BMP post Jardiance addition was WNL except BUN was high reiterated hydration Reports she feels great. Denies chest pain,SOB or palpitations. Able to complete all ADLs.Denies LEE, PND, or orthopnea. Appetite has been Normal. he  adheres to a low-salt diet. Metoprolol to nebivolol switch did not make huge difference in the side effects (delayed orgasm). However want to to continue on the nebivolol. He is out of Xarelto asking for some samples. 2 weeks worth of Xarelto samples provided. PAP info for Xarelto and Eliquis provided to the patient. He will find out total medication expense so far this year to see if he meets eligibility criteria.    Current CHF meds: nebivolol 2.5 mg  mg daily , Jardiance 10 mg daily  Previously tried: none  Adherence Assessment  Do you ever forget to take your medication? [] Yes [x] No  Do you ever skip doses due to side effects? [] Yes [x] No  Do you have trouble affording your medicines? [] Yes [x] No  Are you ever unable to pick up your medication due to transportation difficulties? [] Yes [x] No  Do you ever stop taking your medications because you don't believe they are helping? [] Yes [x] No  Do you  check your weight daily? [] Yes [x] No   Adherence strategy: just remembers to take it   Barriers to obtaining medications: none  BP goal: <130/80    Social History:  Alcohol: moderate  Smoking: never   Diet: low salt diet, low fat    Exercise: swimming - everyday 30 min and resistance - couple times per day   Home BP readings: none   Wt Readings from Last 3 Encounters:  07/12/23 194 lb (88 kg)  07/08/23 195 lb 12.8 oz (88.8 kg)  06/06/23 196 lb 12.8 oz (89.3 kg)   BP Readings from Last 3 Encounters:  07/12/23 118/64  07/08/23 98/60  06/06/23 118/70   Pulse Readings from Last 3 Encounters:  07/12/23 72  07/08/23 65  06/06/23 64    Renal function: CrCl cannot be calculated (Patient's most recent lab result is older than the maximum 21 days allowed.).  Past Medical History:  Diagnosis Date   A-fib (HCC)    On Aspirin only   Anxiety    Cataract    removed left eye that caused retinal detachment x2   ED (erectile dysfunction)    Heart murmur    past hx per pt    Lactose intolerance    Retinal detachment    x2- left eye     Current Outpatient Medications on File Prior to Visit  Medication Sig  Dispense Refill   acetaminophen (TYLENOL) 500 MG tablet Take 500 mg by mouth every 6 (six) hours as needed for mild pain (pain score 1-3).     Cholecalciferol (VITAMIN D-3) 125 MCG (5000 UT) TABS Take 5,000 Units by mouth in the morning.     empagliflozin (JARDIANCE) 10 MG TABS tablet Take 1 tablet (10 mg total) by mouth daily before breakfast. 90 tablet 2   ferrous sulfate ([employer] IRON) 325 (65 FE) MG tablet Take 1 tablet (325 mg total) by mouth 2 (two) times daily with a meal.  3   melatonin 5 MG TABS Take 5 mg by mouth at bedtime.     nebivolol (BYSTOLIC) 2.5 MG tablet Take 1 tablet by mouth once daily 90 tablet 3   rivaroxaban (XARELTO) 20 MG TABS tablet Take 1 tablet (20 mg total) by mouth daily with supper. 30 tablet 11   rivaroxaban (XARELTO) 20 MG TABS tablet Take 1  tablet (20 mg total) by mouth daily with supper. 14 tablet 0   selenium 200 MCG TABS tablet Take 200 mcg by mouth in the morning.     sildenafil (VIAGRA) 100 MG tablet Take 1 tablet (100 mg total) by mouth daily as needed for erectile dysfunction. 20 tablet 11   Spirulina 500 MG TABS Take 500 mg by mouth in the morning.     vardenafil (LEVITRA) 20 MG tablet Take 0.5-1 tablets (10-20 mg total) by mouth daily as needed for erectile dysfunction. 20 tablet 11   zinc gluconate 50 MG tablet Take 50 mg by mouth every other day. In the morning     No current facility-administered medications on file prior to visit.    No Known Allergies   Assessment/Plan:  1. CHF -   Assessment and Plan:  LVEF 40-45% on 04/21/2023, Denies dizziness, lightheadedness, and fatigue Denies chest pain,SOB or palpitations Able to complete all ADLs. Denies LEE, PND, or orthopnea. Appetite has been Normal. He adheres to a low-salt diet. Reports he feels great!  Current HF medications - nebivolol 2.5 mg daily and Jardiance 10 mg daily  Due to soft BP unable to initiate any other HF medications ( in clinic BP  98/60 heart rate 65)  Takes propranolol 10 mg prn for stage anxiety - on the days he need to take propranolol advised to avoid taking nebivolol  Patient to buy new BP monitor and bring it in at next OV for validation and keep record of BP readings    Thank you   Carmela Hurt, Pharm.D [facility] A Division of [facility] [address], [place], [place] [address]  Phone: [phone]; Fax: [phone]

## 2023-09-05 ENCOUNTER — Encounter: Payer: Self-pay | Admitting: Internal Medicine

## 2023-09-05 ENCOUNTER — Ambulatory Visit: Payer: Medicare (Managed Care) | Attending: Internal Medicine | Admitting: Internal Medicine

## 2023-09-05 VITALS — BP 100/52 | HR 64 | Ht 78.0 in | Wt 192.1 lb

## 2023-09-05 DIAGNOSIS — I2510 Atherosclerotic heart disease of native coronary artery without angina pectoris: Secondary | ICD-10-CM | POA: Diagnosis not present

## 2023-09-05 DIAGNOSIS — I4821 Permanent atrial fibrillation: Secondary | ICD-10-CM | POA: Diagnosis not present

## 2023-09-05 DIAGNOSIS — I428 Other cardiomyopathies: Secondary | ICD-10-CM | POA: Diagnosis not present

## 2023-09-05 DIAGNOSIS — I5022 Chronic systolic (congestive) heart failure: Secondary | ICD-10-CM | POA: Diagnosis not present

## 2023-09-05 DIAGNOSIS — I4819 Other persistent atrial fibrillation: Secondary | ICD-10-CM

## 2023-09-05 DIAGNOSIS — I25119 Atherosclerotic heart disease of native coronary artery with unspecified angina pectoris: Secondary | ICD-10-CM

## 2023-09-05 MED ORDER — RIVAROXABAN 20 MG PO TABS: 20.0000 mg | ORAL_TABLET | Freq: Every day | ORAL | 11 refills | Status: DC

## 2023-09-05 NOTE — Patient Instructions (Signed)
 Medication Instructions:   Your Physician recommend you continue on your current medication as directed.     *If you need a refill on your cardiac medications before your next appointment, please call your pharmacy*  Lab Work:  No labs ordered today.  Testing/Procedures:  Your physician has requested that you have an echocardiogram. Echocardiography is a painless test that uses sound waves to create images of your heart. It provides your doctor with information about the size and shape of your heart and how well your heart's chambers and valves are working.   You may receive an ultrasound enhancing agent through an IV if needed to better visualize your heart during the echo. This procedure takes approximately one hour.  There are no restrictions for this procedure.   Please note: We ask at that you not bring children with you during ultrasound (echo/ vascular) testing. Due to room size and safety concerns, children are not allowed in the ultrasound rooms during exams. Our front office staff cannot provide observation of children in our lobby area while testing is being conducted. An adult accompanying a patient to their appointment will only be allowed in the ultrasound room at the discretion of the ultrasound technician under special circumstances. We apologize for any inconvenience.   Follow-Up: At [facility], you and your health needs are our priority.  As part of our continuing mission to provide you with exceptional heart care, our providers are all part of one team.  This team includes your primary Cardiologist (physician) and Advanced Practice Providers or APPs (Physician Assistants and Nurse Practitioners) who all work together to provide you with the care you need, when you need it.  Your next appointment:   6 month(s)  Provider:   You may see Yvonne Kendall, MD or one of the following Advanced Practice Providers on your designated Care Team:   Nicolasa Ducking,  NP Ames Dura, PA-C Eula Listen, PA-C Cadence Edgerton, PA-C Charlsie Quest, NP Carlos Levering, NP    We recommend signing up for the patient portal called "MyChart".  Sign up information is provided on this After Visit Summary.  MyChart is used to connect with patients for Virtual Visits (Telemedicine).  Patients are able to view lab/test results, encounter notes, upcoming appointments, etc.  Non-urgent messages can be sent to your provider as well.   To learn more about what you can do with MyChart, go to [email or web address].

## 2023-09-05 NOTE — Progress Notes (Unsigned)
 Cardiology Office Note:  .   Date:  09/06/2023  ID:  Oneal Grout, DOB 04/04/1956, MRN [id number] PCP: Myrlene Broker, MD  [facility] Providers Cardiologist:  None Electrophysiologist:  Will Jorja Loa, MD     History of Present Illness: .   Jose Holden is a 68 y.o. male with history of atrial fibrillation and cardiomyopathy, presenting for follow-up of cardiomyopathy.  I met him in 03/2023, when he presented with chest pain and lateral ST elevation after a vigorous run.  We took him for emergent cardiac catheterization, which did not show any obstructive CAD.  His LVEF was moderately reduced, suspicious for myocarditis.  He was treated with colchicine and GDMT and has been feeling well.  He denies chest pain and shortness of breath.  He also denies palpitations and lightheadedness.  He notes sporadic swelling in his feet, which is longstanding.  He is no longer taking colchicine.  He inquires about the need to remain on empagliflozin, nebivolol, and rivaroxaban long-term.  He endorses sporadic hemorrhoidal bleeding but has not experienced any significant bleeding.  ROS: See HPI  Studies Reviewed: Marland Kitchen   EKG Interpretation Date/Time:  Thursday September 05 2023 10:00:22 EDT Ventricular Rate:  64 PR Interval:    QRS Duration:  98 QT Interval:  416 QTC Calculation: 429 R Axis:   -78  Text Interpretation: Atrial fibrillation with premature ventricular or aberrantly conducted complexes Left anterior fascicular block Abnormal ECG When compared with ECG of 21-Apr-2023 06:45, No significant change was found Confirmed by Winola Drum [phone]) on 09/06/2023 7:17:11 PM    Risk Assessment/Calculations:    CHA2DS2-VASc Score = 3   This indicates a 3.2% annual risk of stroke. The patient's score is based upon: CHF History: 1 HTN History: 0 Diabetes History: 0 Stroke History: 0 Vascular Disease History: 1 Age Score: 1 Gender Score: 0            Physical Exam:    VS:  BP (!) 100/52 (BP Location: Left Arm, Patient Position: Sitting, Cuff Size: Normal)   Pulse 64   Ht 6\' 6"  (1.981 m)   Wt 192 lb 2 oz (87.1 kg)   SpO2 98%   BMI 22.20 kg/m    Wt Readings from Last 3 Encounters:  09/05/23 192 lb 2 oz (87.1 kg)  07/12/23 194 lb (88 kg)  07/08/23 195 lb 12.8 oz (88.8 kg)    General:  NAD. Neck: No JVD or HJR. Lungs: Clear to auscultation bilaterally without wheezes or crackles. Heart: Irregular rhythm without murmurs, rubs, or gallops. Abdomen: Soft, nontender, nondistended. Extremities: No lower extremity edema.  ASSESSMENT AND PLAN: .    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  Given his reduced LVEF, he was presumed to have acute myocarditis.  He was treated with colchicine, nebivolol (intolerant of other beta-blockers), and dapagliflozin.  He appears euvolemic with NYHA class I symptoms.  He has completed his colchicine course without recurrent chest pain.  We will plan to obtain an echocardiogram at his convenience to ensure that his LVEF has normalized.  If so, we could consider stopping empagliflozin.  Permanent atrial fibrillation: Jose Holden remains reasonably well rate controlled and does not have any symptoms of his atrial fibrillation.  We will plan to continue nebivolol given aforementioned nonischemic cardiomyopathy as well as long-term anticoagulation with rivaroxaban.  We discussed  the role for left atrial appendage occlusion but have agreed to defer this unless his rare, self-limited rectal bleeding attributed to hemorrhoids worsens.  Nonobstructive coronary artery disease: Catheterization in 03/2023 showed 40% D1 and ramus intermedius stenoses with no obstructive CAD in the setting of lateral STEMI.  LDL at that time was 74.  He is not currently on a statin;  we will readdress this at follow-up.  It would be reasonable to consider at least low-dose statin therapy for target LDL less than 70 to prevent progression of his moderate diagonal/ramus disease.  Defer aspirin in the setting of long-term anticoagulation with rivaroxaban.    Dispo: Return to clinic in 6 months.  Signed, Yvonne Kendall, MD

## 2023-09-06 ENCOUNTER — Encounter: Payer: Self-pay | Admitting: Internal Medicine

## 2023-10-02 ENCOUNTER — Encounter: Payer: Self-pay | Admitting: Internal Medicine

## 2023-10-02 ENCOUNTER — Ambulatory Visit: Payer: Medicare (Managed Care) | Admitting: Internal Medicine

## 2023-10-02 VITALS — BP 122/70 | HR 81 | Temp 98.0°F | Ht 78.0 in | Wt 194.0 lb

## 2023-10-02 DIAGNOSIS — R059 Cough, unspecified: Secondary | ICD-10-CM | POA: Insufficient documentation

## 2023-10-02 DIAGNOSIS — R051 Acute cough: Secondary | ICD-10-CM

## 2023-10-02 MED ORDER — PREDNISONE 10 MG PO TABS: ORAL_TABLET | ORAL | 0 refills | Status: AC

## 2023-10-02 MED ORDER — PROMETHAZINE-DM 6.25-15 MG/5ML PO SYRP: 5.0000 mL | ORAL_SOLUTION | Freq: Four times a day (QID) | ORAL | 0 refills | Status: AC | PRN

## 2023-10-02 NOTE — Assessment & Plan Note (Signed)
 Pt non toxic and afeb, with exam benign except for mild pharyngeal erythema and cough, I suspect allergy related as coincides with onset pollen season; now for empiric prednisone trial, cough med prn, otc nasacort  asd, pt declines cxr for now, but would pursue for persistent symtpoms

## 2023-10-02 NOTE — Progress Notes (Signed)
 Patient ID: Jose Holden, male   DOB: 03/18/1956, 68 y.o.   MRN: [id number]        Chief Complaint: follow up cough       HPI:  Jose Holden is a 68 y.o. male here with c/o 1 wk onset scant prod cough difficult to control; did have influenza infection approx 1 mo ago, but this is different with congestion, marked cough, and very small clear sputum.  No fever, chills and really doesn't feel ill like before, just has cough et al.  Pt denies chest pain, increased sob or doe, wheezing, orthopnea, PND, increased LE swelling, palpitations, dizziness or syncope.   Pt denies polydipsia, polyuria, or new focal neuro s/s.        Wt Readings from Last 3 Encounters:  10/02/23 194 lb (88 kg)  09/05/23 192 lb 2 oz (87.1 kg)  07/12/23 194 lb (88 kg)   BP Readings from Last 3 Encounters:  10/02/23 122/70  09/05/23 (!) 100/52  07/12/23 118/64         Past Medical History:  Diagnosis Date   A-fib (HCC)    On Aspirin  only   Anxiety    Cataract    removed left eye that caused retinal detachment x2   ED (erectile dysfunction)    Heart murmur    past hx per pt    Lactose intolerance    Retinal detachment    x2- left eye    Past Surgical History:  Procedure Laterality Date   APPENDECTOMY     CARDIOVERSION N/A 11/09/2020   Procedure: CARDIOVERSION;  Surgeon: Maudine Sos, MD;  Location: [facility] ENDOSCOPY;  Service: Cardiovascular;  Laterality: N/A;   CARDIOVERSION N/A 12/30/2020   Procedure: CARDIOVERSION;  Surgeon: Wendie Hamburg, MD;  Location: [facility] ENDOSCOPY;  Service: Cardiovascular;  Laterality: N/A;   COLONOSCOPY     10 yr ago [place] - normal per pt    COLONOSCOPY WITH PROPOFOL  N/A 09/07/2020   Procedure: COLONOSCOPY WITH PROPOFOL ;  Surgeon: Nannette Babe, MD;  Location: [facility] ENDOSCOPY;  Service: Gastroenterology;  Laterality: N/A;   ESOPHAGOGASTRODUODENOSCOPY (EGD) WITH PROPOFOL  N/A 09/05/2020   Procedure: ESOPHAGOGASTRODUODENOSCOPY (EGD) WITH PROPOFOL ;  Surgeon: Nannette Babe, MD;   Location: [facility] ENDOSCOPY;  Service: Gastroenterology;  Laterality: N/A;   EYE SURGERY     detached retina surgery x2   GIVENS CAPSULE STUDY N/A 09/05/2020   Procedure: GIVENS CAPSULE STUDY;  Surgeon: Nannette Babe, MD;  Location: [facility] ENDOSCOPY;  Service: Gastroenterology;  Laterality: N/A;   GIVENS CAPSULE STUDY N/A 09/10/2020   Procedure: GIVENS CAPSULE STUDY;  Surgeon: Nannette Babe, MD;  Location: [facility] END[facility];  Service: Gastroenterology;  Laterality: N/A;   LAPAROSCOPIC APPENDECTOMY N/A 08/29/2015   Procedure: APPENDECTOMY LAPAROSCOPIC;  Surgeon: Jacolyn Matar, MD;  Location: [facility] ORS;  Service: General;  Laterality: N/A;   LEFT HEART CATH AND CORONARY ANGIOGRAPHY N/A 04/20/2023   Procedure: LEFT HEART CATH AND CORONARY ANGIOGRAPHY;  Surgeon: Sammy Crisp, MD;  Location: [facility] INVASIVE CV LAB;  Service: Cardiovascular;  Laterality: N/A;   UPPER GASTROINTESTINAL ENDOSCOPY      reports that he has never smoked. He has never used smokeless tobacco. He reports current alcohol  use. He reports that he does not use drugs. family history includes Brain cancer in his father; Cancer in his father and mother; Pancreatic cancer in his mother; Seizures in his father. No Known Allergies Current Outpatient Medications on File Prior to Visit  Medication Sig Dispense Refill   acetaminophen  (TYLENOL ) 500  MG tablet Take 500 mg by mouth every 6 (six) hours as needed for mild pain (pain score 1-3).     Cholecalciferol (VITAMIN D-3) 125 MCG (5000 UT) TABS Take 5,000 Units by mouth in the morning.     empagliflozin  (JARDIANCE ) 10 MG TABS tablet Take 1 tablet (10 mg total) by mouth daily before breakfast. 90 tablet 2   ferrous sulfate  325 (65 FE) MG EC tablet Take 325 mg by mouth every other day.     melatonin 5 MG TABS Take 5 mg by mouth at bedtime.     nebivolol  (BYSTOLIC ) 2.5 MG tablet Take 1 tablet by mouth once daily 90 tablet 3   oseltamivir (TAMIFLU) 75 MG capsule Take 75 mg by mouth 2 (two) times daily.      rivaroxaban  (XARELTO ) 20 MG TABS tablet Take 1 tablet (20 mg total) by mouth daily with supper. 30 tablet 11   selenium  200 MCG TABS tablet Take 200 mcg by mouth in the morning.     sildenafil  (VIAGRA ) 100 MG tablet Take 1 tablet (100 mg total) by mouth daily as needed for erectile dysfunction. 20 tablet 11   Spirulina 500 MG TABS Take 500 mg by mouth in the morning.     triamcinolone  cream (KENALOG ) 0.1 % Apply 1 Application topically.     vardenafil  (LEVITRA ) 20 MG tablet Take 0.5-1 tablets (10-20 mg total) by mouth daily as needed for erectile dysfunction. 20 tablet 11   zinc gluconate 50 MG tablet Take 50 mg by mouth every other day. In the morning     No current facility-administered medications on file prior to visit.        ROS:  All others reviewed and negative.  Objective        PE:  BP 122/70 (BP Location: Right Arm, Patient Position: Sitting, Cuff Size: Normal)   Pulse 81   Temp 98 F (36.7 C) (Oral)   Ht 6\' 6"  (1.981 m)   Wt 194 lb (88 kg)   SpO2 98%   BMI 22.42 kg/m                 Constitutional: Pt appears in NAD except cough               HENT: Head: NCAT.                Right Ear: External ear normal.                 Left Ear: External ear normal. Bilat tm's with mild erythema.  Max sinus areas non tender.  Pharynx with mild erythema, no exudate               Eyes: . Pupils are equal, round, and reactive to light. Conjunctivae and EOM are normal               Nose: without d/c or deformity               Neck: Neck supple. Gross normal ROM, no lymphadenopathy appreciated               Cardiovascular: Normal rate and regular rhythm.                 Pulmonary/Chest: Effort normal and breath sounds without rales or wheezing.                Abd:  Soft, NT, ND, + BS, no organomegaly  Neurological: Pt is alert. At baseline orientation, motor grossly intact               Skin: Skin is warm. No rashes, no other new lesions, LE edema - none                Psychiatric: Pt behavior is normal without agitation   Micro: none  Cardiac tracings I have personally interpreted today:  none  Pertinent Radiological findings (summarize): none   Lab Results  Component Value Date   WBC 5.9 04/21/2023   HGB 13.0 04/21/2023   HCT 38.6 (L) 04/21/2023   PLT 191 04/21/2023   GLUCOSE 82 06/21/2023   CHOL 120 04/20/2023   TRIG 38 04/20/2023   HDL 38 (L) 04/20/2023   LDLCALC 74 04/20/2023   ALT 56 (H) 04/20/2023   AST 64 (H) 04/20/2023   NA 141 06/21/2023   K 4.2 06/21/2023   CL 105 06/21/2023   CREATININE 0.92 06/21/2023   BUN 40 (H) 06/21/2023   CO2 19 (L) 06/21/2023   TSH 2.509 04/20/2023   INR 1.2 09/09/2020   HGBA1C 5.7 (H) 04/20/2023   POCT - COVID - neg, Flu - neg  Assessment/Plan:  Jose Holden is a 68 y.o. White or Caucasian [1] male with  has a past medical history of A-fib (HCC), Anxiety, Cataract, ED (erectile dysfunction), Heart murmur, Lactose intolerance, and Retinal detachment.  Cough Pt non toxic and afeb, with exam benign except for mild pharyngeal erythema and cough, I suspect allergy related as coincides with onset pollen season; now for empiric prednisone trial, cough med prn, otc nasacort  asd, pt declines cxr for now, but would pursue for persistent symtpoms  Followup: Return if symptoms worsen or fail to improve.  Rosalia Colonel, MD 10/02/2023 8:15 PM [facility]

## 2023-10-02 NOTE — Patient Instructions (Signed)
 Your Flu and COVID testing is negative  Please take all new medication as prescribed - the prednisone and cough medicine as needed  You may to take OTC Nasacort  as well to help control after the prednisone is done  Please continue all other medications as before, and refills have been done if requested.  Please have the pharmacy call with any other refills you may need.  Please keep your appointments with your specialists as you may have planned

## 2023-10-08 ENCOUNTER — Ambulatory Visit (HOSPITAL_COMMUNITY): Payer: Medicare (Managed Care)

## 2023-10-10 ENCOUNTER — Telehealth: Payer: Self-pay | Admitting: Student

## 2023-10-10 NOTE — Telephone Encounter (Signed)
 Pt c/o medication issue:  1. Name of Medication: empagliflozin  (JARDIANCE ) 10 MG TABS tablet   2. How are you currently taking this medication (dosage and times per day)? Will pick up 1 tab today  3. Are you having a reaction (difficulty breathing--STAT)?   4. What is your medication issue? Cannot afford due to no insurance, needs assistance or other alternative. Requesting cb

## 2023-10-11 ENCOUNTER — Telehealth: Payer: Self-pay | Admitting: Student

## 2023-10-11 ENCOUNTER — Telehealth: Payer: Self-pay | Admitting: Pharmacy Technician

## 2023-10-11 ENCOUNTER — Other Ambulatory Visit (HOSPITAL_COMMUNITY): Payer: Self-pay

## 2023-10-11 ENCOUNTER — Other Ambulatory Visit: Payer: Self-pay

## 2023-10-11 MED ORDER — EMPAGLIFLOZIN 10 MG PO TABS: 10.0000 mg | ORAL_TABLET | Freq: Every day | ORAL | 0 refills | Status: DC

## 2023-10-11 NOTE — Telephone Encounter (Signed)
 Called pt left a message advising samples have been placed at the Coumadin clinic for pick up.

## 2023-10-11 NOTE — Telephone Encounter (Signed)
 Pt states he would like a alternative medication until he is approved for the Jardiance  and would like a c/b. Please advise

## 2023-10-11 NOTE — Telephone Encounter (Signed)
 Patient identification verified by 2 forms. Sims Duck, RN     Called and spoke to patient  Relayed provider recommendations  Patient aware:  - Jardiance  samples left at coumadin clinic front desk.  - PAP for jardiance  still pending.    Patient verbalized understanding, no questions at this time

## 2023-10-11 NOTE — Telephone Encounter (Signed)
Patient calling the office for samples of medication:   1.  What medication and dosage are you requesting samples for? Jardiance  2.  Are you currently out of this medication? yes

## 2023-10-11 NOTE — Telephone Encounter (Signed)
 Left a message to call back. Will leave samples of Jardiance  at Coumadin clinic for pt to pick up.

## 2023-10-11 NOTE — Telephone Encounter (Signed)
 PAP: Patient assistance application for Jardiance through [employer]) has been mailed to pt's home address on file. Provider portion of application will be faxed to provider's office.

## 2023-10-15 ENCOUNTER — Telehealth: Payer: Self-pay | Admitting: Cardiology

## 2023-10-15 NOTE — Telephone Encounter (Signed)
 Left voicemail to return call to office

## 2023-10-15 NOTE — Telephone Encounter (Signed)
 Patient was returning call. Please advise ?

## 2023-10-15 NOTE — Telephone Encounter (Signed)
  Patient calling the office for samples of medication:   1.  What medication and dosage are you requesting samples for?nebivolol (BYSTOLIC) 10 MG tablet  2.  Are you currently out of this medication? Yes

## 2023-10-15 NOTE — Telephone Encounter (Signed)
 Called patient back. He just wanted to know if we had any samples of bystolic . Informed patient that we have no samples of this medication. Patient thanked us  for the call, and that was all he needed.

## 2023-10-23 ENCOUNTER — Ambulatory Visit: Payer: Medicare (Managed Care) | Attending: Pharmacist | Admitting: Pharmacist

## 2023-10-23 NOTE — Progress Notes (Deleted)
 Patient ID: Jose Holden                 DOB: 06/28/1955                      MRN: [id number]     HPI: Jose Holden is a 68 y.o. male referred by Pilar Bridge, PA  to pharmacy clinic for HF medication management. PMH is significant for PAF,CAD,myopericarditis,retinal detachment, h/o GI bleeds. Moderately reduced left ventricular systolic function with global hypokinesis (LVEF 40-45% on 04/21/2023).Recent cath lab  showed non-obstructive disease.     Patient presented today for HF medications optimization. He bought new BP monitor. It is wrist cuff found out to be inaccurate. Will return and buy arm cuff. BMP post Jardiance  addition was WNL except BUN was high reiterated hydration Reports she feels great. Denies chest pain,SOB or palpitations. Able to complete all ADLs.Denies LEE, PND, or orthopnea. Appetite has been Normal. he  adheres to a low-salt diet. Metoprolol  to nebivolol  switch did not make huge difference in the side effects (delayed orgasm). However want to to continue on the nebivolol . He is out of Xarelto  asking for some samples. 2 weeks worth of Xarelto  samples provided. PAP info for Xarelto  and Eliquis  provided to the patient. He will find out total medication expense so far this year to see if he meets eligibility criteria.    Current CHF meds: nebivolol  2.5 mg  mg daily , Jardiance  10 mg daily  Previously tried: none  Adherence Assessment  Do you ever forget to take your medication? [] Yes [x] No  Do you ever skip doses due to side effects? [] Yes [x] No  Do you have trouble affording your medicines? [] Yes [x] No  Are you ever unable to pick up your medication due to transportation difficulties? [] Yes [x] No  Do you ever stop taking your medications because you don't believe they are helping? [] Yes [x] No  Do you check your weight daily? [] Yes [x] No   Adherence strategy: just remembers to take it   Barriers to obtaining medications: none  BP goal: <130/80     Social History:  Alcohol : moderate  Smoking: never   Diet: low salt diet, low fat    Exercise: swimming - everyday 30 min and resistance - couple times per day   Home BP readings: none   Wt Readings from Last 3 Encounters:  10/02/23 194 lb (88 kg)  09/05/23 192 lb 2 oz (87.1 kg)  07/12/23 194 lb (88 kg)   BP Readings from Last 3 Encounters:  10/02/23 122/70  09/05/23 (!) 100/52  07/12/23 118/64   Pulse Readings from Last 3 Encounters:  10/02/23 81  09/05/23 64  07/12/23 72    Renal function: CrCl cannot be calculated (Patient's most recent lab result is older than the maximum 21 days allowed.).  Past Medical History:  Diagnosis Date   A-fib (HCC)    On Aspirin  only   Anxiety    Cataract    removed left eye that caused retinal detachment x2   ED (erectile dysfunction)    Heart murmur    past hx per pt    Lactose intolerance    Retinal detachment    x2- left eye     Current Outpatient Medications on File Prior to Visit  Medication Sig Dispense Refill   acetaminophen  (TYLENOL ) 500 MG tablet Take 500 mg by mouth every 6 (six) hours as needed for mild pain (pain score 1-3).     Cholecalciferol (VITAMIN  D-3) 125 MCG (5000 UT) TABS Take 5,000 Units by mouth in the morning.     empagliflozin  (JARDIANCE ) 10 MG TABS tablet Take 1 tablet (10 mg total) by mouth daily before breakfast. 90 tablet 2   empagliflozin  (JARDIANCE ) 10 MG TABS tablet Take 1 tablet (10 mg total) by mouth daily before breakfast. This is a sample 14 tablet 0   ferrous sulfate  325 (65 FE) MG EC tablet Take 325 mg by mouth every other day.     melatonin 5 MG TABS Take 5 mg by mouth at bedtime.     nebivolol  (BYSTOLIC ) 2.5 MG tablet Take 1 tablet by mouth once daily 90 tablet 3   oseltamivir (TAMIFLU) 75 MG capsule Take 75 mg by mouth 2 (two) times daily.     predniSONE  (DELTASONE ) 10 MG tablet 3 tabs by mouth per day for 3 days,2tabs per day for 3 days,1tab per day for 3 days 18 tablet 0    promethazine -dextromethorphan (PROMETHAZINE -DM) 6.25-15 MG/5ML syrup Take 5 mLs by mouth 4 (four) times daily as needed. 118 mL 0   rivaroxaban  (XARELTO ) 20 MG TABS tablet Take 1 tablet (20 mg total) by mouth daily with supper. 30 tablet 11   selenium  200 MCG TABS tablet Take 200 mcg by mouth in the morning.     sildenafil  (VIAGRA ) 100 MG tablet Take 1 tablet (100 mg total) by mouth daily as needed for erectile dysfunction. 20 tablet 11   Spirulina 500 MG TABS Take 500 mg by mouth in the morning.     triamcinolone  cream (KENALOG ) 0.1 % Apply 1 Application topically.     vardenafil  (LEVITRA ) 20 MG tablet Take 0.5-1 tablets (10-20 mg total) by mouth daily as needed for erectile dysfunction. 20 tablet 11   zinc gluconate 50 MG tablet Take 50 mg by mouth every other day. In the morning     No current facility-administered medications on file prior to visit.    No Known Allergies   Assessment/Plan:  1. CHF -   Assessment and Plan:  LVEF 40-45% on 04/21/2023, Denies dizziness, lightheadedness, and fatigue Denies chest pain,SOB or palpitations Able to complete all ADLs. Denies LEE, PND, or orthopnea. Appetite has been Normal. He adheres to a low-salt diet. Reports he feels great!  Current HF medications - nebivolol  2.5 mg daily and Jardiance  10 mg daily  Due to soft BP unable to initiate any other HF medications ( in clinic BP  98/60 heart rate 65)  Takes propranolol  10 mg prn for stage anxiety - on the days he need to take propranolol  advised to avoid taking nebivolol   Patient to buy new BP monitor and bring it in at next OV for validation and keep record of BP readings    Thank you   Nickola Baron, Pharm.D [facility] A Division of [facility] [address] [place], [place] [address]  Phone: [phone]; Fax: [phone]

## 2023-10-25 ENCOUNTER — Other Ambulatory Visit (HOSPITAL_COMMUNITY): Payer: Self-pay

## 2023-10-25 ENCOUNTER — Telehealth: Payer: Self-pay | Admitting: Cardiology

## 2023-10-25 NOTE — Telephone Encounter (Signed)
 Patient c[employer] medicare termed on 08/26/23. He said he has new insurance that should be in effect next week. He has a Orthoptist that can be used once he has [employer]. He said it may take up to 90 days for medicare to take affect. The patient is aware that once his medicare is active, he can ask [employer] to run his insurance and the grant. In the meantime, he asked for me to email him the application. I did.

## 2023-10-25 NOTE — Telephone Encounter (Signed)
 Patient calling the office for samples of medication:   1.  What medication and dosage are you requesting samples for?   empagliflozin  (JARDIANCE ) 10 MG TABS tablet    2.  Are you currently out of this medication? Yes   Pt stated he is requesting a callback today regarding samples. Please advise

## 2023-10-25 NOTE — Telephone Encounter (Addendum)
 Medication name/dosage: Samples List: Jardiance  10 mg  Administration instructions: 10 mg by mouth daily before breakfast  Reason for samples: Reason for samples: unable to afford medication  Ordering provider:  *Once above information entered, route the phone encounter to CV DIV MAG ST SAMPLES and send Teams message to team member assigned to Samples for the day.   Will also cc pt assistance team in on this thread, so that they can further assist the pt with getting this process started for Jardiance .

## 2023-10-25 NOTE — Telephone Encounter (Signed)
 Patient requested Jardiace 10 mg PO sample. Currently we run out of Jardiance  10 mg PO and I gave the notice for [facility].

## 2023-10-28 ENCOUNTER — Other Ambulatory Visit (HOSPITAL_COMMUNITY): Payer: Self-pay

## 2023-10-28 MED ORDER — EMPAGLIFLOZIN 10 MG PO TABS
10.0000 mg | ORAL_TABLET | Freq: Every day | ORAL | 0 refills | Status: DC
  Filled 2023-10-28: qty 30, 30d supply, fill #0

## 2023-10-28 NOTE — Addendum Note (Signed)
 Addended by: Reyes Caul, Tarrin Lebow L on: 10/28/2023 09:17 AM   Modules accepted: Orders

## 2023-10-28 NOTE — Telephone Encounter (Addendum)
 Patient stated he has never used a 30 day free trial coupon for Jardiance . Will place one for him to pick up today at front desk. Will send 30 supply to pharmacy downstairs for patient to pick up with his coupon.   Pharmacy just contacted us  and stated that Jardiance  is no longer using the coupon.  Called patient back and informed him of this.  Patient would like to get an email to send his patient assistance forms to. Gave patient email to send to Crystal. Will also send in Mychart.

## 2023-10-28 NOTE — Telephone Encounter (Signed)
 Jardiance  samples will be here tomorrow per Kristin

## 2023-10-28 NOTE — Telephone Encounter (Signed)
 Pam-requesting someone to let the patient know when samples come in. Sending to you guys since none of us  will be here tomorrow. Thanks!

## 2023-10-28 NOTE — Telephone Encounter (Signed)
Patient is calling to follow up

## 2023-10-29 MED ORDER — EMPAGLIFLOZIN 10 MG PO TABS: 10.0000 mg | ORAL_TABLET | Freq: Every day | ORAL | Status: AC

## 2023-10-29 NOTE — Telephone Encounter (Signed)
 You  Pate Ingalls, Dina Francisco, RNJust now (12:19 PM)    I called the patient and had to leave a message    Antonetta Kitchen, RN  Rx Med Assistance Team; Cv Div Mag St Samples45 minutes ago (11:34 AM)    Patient has questions about the forms

## 2023-10-29 NOTE — Addendum Note (Signed)
 Addended by: Reyes Caul, Marceline Napierala L on: 10/29/2023 11:34 AM   Modules accepted: Orders

## 2023-10-29 NOTE — Telephone Encounter (Signed)
 Called patient to let him know we have samples. Patient will come by today to pick them up. Patient would like to talk to someone about his forms.

## 2023-10-30 NOTE — Telephone Encounter (Signed)
 Patient called back and he is emailing his application. It says proof of income may be needed but not required so he is just sending his application and jardiance  will let us  know if proof and oop needed

## 2023-10-31 ENCOUNTER — Encounter (HOSPITAL_COMMUNITY): Payer: Self-pay

## 2023-10-31 ENCOUNTER — Ambulatory Visit (HOSPITAL_COMMUNITY)
Admission: RE | Admit: 2023-10-31 | Discharge: 2023-10-31 | Disposition: A | Discharge status: Home / Self Care | Payer: Self-pay | Source: Ambulatory Visit | Attending: Cardiology | Admitting: Cardiology

## 2023-10-31 DIAGNOSIS — I2510 Atherosclerotic heart disease of native coronary artery without angina pectoris: Secondary | ICD-10-CM

## 2023-10-31 NOTE — Telephone Encounter (Signed)
 Faxed over application.

## 2023-11-06 NOTE — Telephone Encounter (Signed)
 Faxed over [phone] for jardiance  app

## 2023-11-06 NOTE — Telephone Encounter (Signed)
 Waiting on patient verification of income- reached out to patient

## 2023-11-06 NOTE — Telephone Encounter (Signed)
 Printed out forms will see if we can get signed today.

## 2023-11-07 NOTE — Telephone Encounter (Signed)
 Forwarding to [place] [employer]'s team to address and obtain signature tomorrow when provider back in  the office

## 2023-11-08 NOTE — Telephone Encounter (Signed)
 Provider copy faxed and attached in media

## 2023-11-12 ENCOUNTER — Telehealth: Payer: Self-pay | Admitting: Cardiology

## 2023-11-12 ENCOUNTER — Other Ambulatory Visit (HOSPITAL_COMMUNITY): Payer: Self-pay

## 2023-11-12 MED ORDER — EMPAGLIFLOZIN 10 MG PO TABS: 10.0000 mg | ORAL_TABLET | Freq: Every day | ORAL | 0 refills | Status: DC

## 2023-11-12 NOTE — Addendum Note (Signed)
 Addended by: Tomie Foyer A on: 11/12/2023 11:07 AM   Modules accepted: Orders

## 2023-11-12 NOTE — Telephone Encounter (Signed)
 Provider portion refaxed

## 2023-11-12 NOTE — Telephone Encounter (Signed)
 Patient calling the office for samples of medication:   1.  What medication and dosage are you requesting samples for?empagliflozin  (JARDIANCE ) 10 MG TABS tablet   2.  Are you currently out of this medication? Yes He has sent in his application but it still has not been approved

## 2023-11-14 ENCOUNTER — Telehealth: Payer: Self-pay | Admitting: Internal Medicine

## 2023-11-14 NOTE — Telephone Encounter (Signed)
 Copied from CRM [id number]. Topic: Clinical - Medical Advice >> Nov 14, 2023  2:36 PM [place] wrote: Reason for CRM: Patient wanting to know if he needs to get another shingles shot this year and also if there is another immunization that he is due to get, please reach out to patient as soon as possible

## 2023-11-18 NOTE — Telephone Encounter (Signed)
 Approved- waiting on fax confirmation

## 2023-11-21 NOTE — Telephone Encounter (Signed)
 Please advise as immunization records  says 2017

## 2023-11-22 NOTE — Telephone Encounter (Signed)
 His shingles is complete. Due for pneumonia if he has not gotten at pharmacy.

## 2023-11-25 ENCOUNTER — Telehealth: Payer: Self-pay | Admitting: Internal Medicine

## 2023-11-25 NOTE — Telephone Encounter (Unsigned)
 Copied from CRM [id number]. Topic: Appointments - Appointment Scheduling >> Nov 25, 2023 10:42 AM Berneda FALCON wrote: Pt states he needs a letter from Dr. Rollene that he has a diagnosis that states he needs a service (comfort) animal, please. In his case, he needs a diagnosis of mental health that requires a comfort animal. It is not a form, but a letter from PCP. Cat's name is Simba and he is orange tabby 68 years old (if you need that information). He would need this letter as soon as possible please. He was attempting to make an appt but the time/date was too far out.  Pt callback is [phone]

## 2023-11-26 NOTE — Telephone Encounter (Signed)
 Please advise for a companion letter

## 2023-11-27 NOTE — Telephone Encounter (Signed)
 Copied from CRM [id number]. Topic: Clinical - Medical Advice >> Nov 27, 2023 11:09 AM Rea BROCKS wrote: Reason for CRM: Patient has not heard from anybody yet about confirming that he needs the shingle vaccine. He stated that he sent the message before and it was not confirmed whether he needs a new shingle shot or any other immunizations that he may need updated. He doesnt want to do anything that he doesnt need to do. Needs Dr. Rollene to verify if he does need the shingle shot or any immunizations. [phone]

## 2023-11-27 NOTE — Telephone Encounter (Signed)
 Copied from CRM [id number]. Topic: General - Call Back - No Documentation >> Nov 27, 2023 11:09 AM Rea BROCKS wrote: Reason for CRM: Patient received letter and wanted to thank Dr. Rollene. There was no where on the page to send a thank you for the letter that she wrote on his behalf

## 2023-11-27 NOTE — Telephone Encounter (Signed)
 Letter done

## 2023-12-31 ENCOUNTER — Telehealth (HOSPITAL_COMMUNITY): Payer: Self-pay | Admitting: Internal Medicine

## 2023-12-31 NOTE — Telephone Encounter (Signed)
 Patient has called and cancelled the ordered echocardiogram per Dr End.  Patient has cancelled echocardiogram on 5/13, 06/15, 06/17 and 01/01/24. We will not make any attempt to reschedule echocardiogram. Order will be removed from the ECHO WQ. Thank you

## 2024-01-01 ENCOUNTER — Ambulatory Visit (HOSPITAL_COMMUNITY): Payer: Self-pay

## 2024-01-07 ENCOUNTER — Telehealth: Payer: Self-pay | Admitting: Physician Assistant

## 2024-01-07 ENCOUNTER — Other Ambulatory Visit: Payer: Self-pay | Admitting: Physician Assistant

## 2024-01-07 NOTE — Telephone Encounter (Signed)
*  STAT* If patient is at the pharmacy, call can be transferred to refill team.   1. Which medications need to be refilled? (please list name of each medication and dose if known) nebivolol  (BYSTOLIC ) 2.5 MG tablet    2. Would you like to learn more about the convenience, safety, & potential cost savings by using the [facility]?    3. Are you open to using the [facility] (Type [facility].  ).   4. Which pharmacy/location (including street and city if local pharmacy) is medication to be sent to? [facility] [address], [place] - [address]    5. Do they need a 30 day or 90 day supply? 90 day     Pt out of medication

## 2024-01-08 ENCOUNTER — Other Ambulatory Visit: Payer: Self-pay | Admitting: Physician Assistant

## 2024-01-08 NOTE — Telephone Encounter (Signed)
 Refills @ pharmacy

## 2024-01-09 NOTE — Telephone Encounter (Signed)
 Patient is following up. He spoke with pharmacy and they do not have prescription on file. Looks like it was never received. Patient is completely out of medication. Patient requests a call back to confirm when this is taken care of if possible.

## 2024-01-10 ENCOUNTER — Other Ambulatory Visit: Payer: Self-pay | Admitting: Physician Assistant

## 2024-01-10 MED ORDER — NEBIVOLOL HCL 2.5 MG PO TABS: 2.5000 mg | ORAL_TABLET | Freq: Every day | ORAL | 3 refills | Status: AC

## 2024-01-29 ENCOUNTER — Other Ambulatory Visit: Payer: Self-pay | Admitting: Student

## 2024-01-30 MED ORDER — EMPAGLIFLOZIN 10 MG PO TABS: 10.0000 mg | ORAL_TABLET | Freq: Every day | ORAL | 0 refills | Status: DC

## 2024-02-05 ENCOUNTER — Telehealth: Payer: Self-pay | Admitting: Pharmacy Technician

## 2024-02-05 ENCOUNTER — Telehealth: Payer: Self-pay | Admitting: Internal Medicine

## 2024-02-05 ENCOUNTER — Other Ambulatory Visit (HOSPITAL_COMMUNITY): Payer: Self-pay

## 2024-02-05 MED ORDER — RIVAROXABAN 20 MG PO TABS: 20.0000 mg | ORAL_TABLET | Freq: Every day | ORAL | 6 refills | Status: AC

## 2024-02-05 NOTE — Telephone Encounter (Signed)
 Patient calling the office for samples of medication:   1.  What medication and dosage are you requesting samples for?    rivaroxaban  (XARELTO ) 20 MG TABS tablet    2.  Are you currently out of this medication?  Has 2 days left. He states he will be in [place] tomorrow and can pick them up at [facility] if available. Please advise.

## 2024-02-05 NOTE — Telephone Encounter (Signed)
 Pt c/o medication issue:  1. Name of Medication:   rivaroxaban  (XARELTO ) 20 MG TABS tablet    2. How are you currently taking this medication (dosage and times per day)? As written   3. Are you having a reaction (difficulty breathing--STAT)? No   4. What is your medication issue? Pt called in to ask if he qualifies for patient assistance for this medication

## 2024-02-05 NOTE — Telephone Encounter (Signed)
 I got the patient a free 30 day trial while he waits on the xarelto  to be mailed from [facility] but per coupon he has used this back in November and they are only allowed 1 per lifetime.       Goodrx would be 150.86 for 7 days  Goodrx would be this for 30 days:

## 2024-02-05 NOTE — Telephone Encounter (Signed)
 Application submitted online through [email or web address] site

## 2024-02-05 NOTE — Telephone Encounter (Signed)
 Called patient to let him know we do not have any Xarelto  20 mg samples at this office. Will reach out to [facility] locations.

## 2024-02-05 NOTE — Telephone Encounter (Signed)
 Called patient and let him know there are no samples, due to Xarelto  going generic. Patient request medication be sent to his local pharmacy, so he may pick-up just a few days worth to get him through, until he gets his in the mail. Will send in for patient to his local pharmacy.

## 2024-02-05 NOTE — Telephone Encounter (Signed)
 No samples at [facility] location

## 2024-02-05 NOTE — Telephone Encounter (Signed)
   No insurance coverage   I called the patient and he asked for me to email him the application. Emailed

## 2024-02-05 NOTE — Telephone Encounter (Signed)
 I called patient and working on assistance in another encounter

## 2024-04-15 ENCOUNTER — Other Ambulatory Visit: Payer: Self-pay | Admitting: Cardiology

## 2024-06-05 ENCOUNTER — Telehealth: Payer: Self-pay | Admitting: Pharmacy Technician

## 2024-06-05 NOTE — Telephone Encounter (Signed)
" ° ° °  Xarelto  [facility] 2026  "

## 2024-07-03 ENCOUNTER — Other Ambulatory Visit: Payer: Self-pay | Admitting: Cardiology
